# Patient Record
Sex: Female | Born: 1935
Health system: Southern US, Community
[De-identification: ages and names within clinical notes are randomized; demographics above are authoritative.]

## PROBLEM LIST (undated history)

## (undated) DIAGNOSIS — E785 Hyperlipidemia, unspecified: Secondary | ICD-10-CM

## (undated) DIAGNOSIS — K219 Gastro-esophageal reflux disease without esophagitis: Secondary | ICD-10-CM

## (undated) DIAGNOSIS — C4490 Unspecified malignant neoplasm of skin, unspecified: Secondary | ICD-10-CM

## (undated) DIAGNOSIS — D509 Iron deficiency anemia, unspecified: Secondary | ICD-10-CM

## (undated) DIAGNOSIS — Z85828 Personal history of other malignant neoplasm of skin: Secondary | ICD-10-CM

## (undated) DIAGNOSIS — I10 Essential (primary) hypertension: Secondary | ICD-10-CM

## (undated) DIAGNOSIS — D649 Anemia, unspecified: Secondary | ICD-10-CM

## (undated) DIAGNOSIS — Z8619 Personal history of other infectious and parasitic diseases: Secondary | ICD-10-CM

## (undated) DIAGNOSIS — F419 Anxiety disorder, unspecified: Secondary | ICD-10-CM

## (undated) DIAGNOSIS — E119 Type 2 diabetes mellitus without complications: Secondary | ICD-10-CM

## (undated) HISTORY — DX: Type 2 diabetes mellitus without complications: E11.9

## (undated) HISTORY — DX: Personal history of other malignant neoplasm of skin: Z85.828

## (undated) HISTORY — PX: ABDOMINAL HYSTERECTOMY: SHX81

## (undated) HISTORY — DX: Essential (primary) hypertension: I10

## (undated) HISTORY — DX: Iron deficiency anemia, unspecified: D50.9

## (undated) HISTORY — DX: Personal history of other infectious and parasitic diseases: Z86.19

## (undated) HISTORY — DX: Hyperlipidemia, unspecified: E78.5

## (undated) HISTORY — DX: Unspecified malignant neoplasm of skin, unspecified: C44.90

## (undated) HISTORY — DX: Anemia, unspecified: D64.9

## (undated) HISTORY — PX: CATARACT EXTRACTION: SUR2

---

## 1972-01-22 HISTORY — PX: TOTAL ABDOMINAL HYSTERECTOMY W/ BILATERAL SALPINGOOPHORECTOMY: SHX83

## 2007-04-10 ENCOUNTER — Ambulatory Visit: Payer: Self-pay | Admitting: Family Medicine

## 2008-04-14 ENCOUNTER — Ambulatory Visit: Payer: Self-pay | Admitting: Family Medicine

## 2009-02-25 ENCOUNTER — Emergency Department: Payer: Self-pay | Admitting: Internal Medicine

## 2009-05-09 ENCOUNTER — Emergency Department: Payer: Self-pay | Admitting: Emergency Medicine

## 2009-05-11 ENCOUNTER — Ambulatory Visit: Payer: Self-pay | Admitting: Family Medicine

## 2009-07-17 ENCOUNTER — Ambulatory Visit: Payer: Self-pay | Admitting: Vascular Surgery

## 2009-07-17 ENCOUNTER — Emergency Department (HOSPITAL_COMMUNITY): Admission: EM | Admit: 2009-07-17 | Discharge: 2009-07-17 | Payer: Self-pay | Admitting: Emergency Medicine

## 2009-07-17 ENCOUNTER — Encounter (INDEPENDENT_AMBULATORY_CARE_PROVIDER_SITE_OTHER): Payer: Self-pay | Admitting: Emergency Medicine

## 2009-10-13 ENCOUNTER — Ambulatory Visit: Payer: Self-pay | Admitting: General Practice

## 2009-11-21 ENCOUNTER — Ambulatory Visit: Payer: Self-pay | Admitting: Anesthesiology

## 2009-11-22 ENCOUNTER — Ambulatory Visit: Payer: Self-pay | Admitting: General Practice

## 2010-02-26 ENCOUNTER — Ambulatory Visit: Payer: Self-pay | Admitting: General Practice

## 2010-03-14 ENCOUNTER — Ambulatory Visit: Payer: Self-pay | Admitting: General Practice

## 2010-03-22 HISTORY — PX: REPLACEMENT TOTAL KNEE: SUR1224

## 2010-04-02 ENCOUNTER — Inpatient Hospital Stay: Payer: Self-pay | Admitting: General Practice

## 2010-09-04 ENCOUNTER — Ambulatory Visit: Payer: Self-pay | Admitting: Family Medicine

## 2011-01-01 ENCOUNTER — Ambulatory Visit: Payer: Self-pay | Admitting: General Practice

## 2011-01-22 HISTORY — PX: REPLACEMENT TOTAL KNEE: SUR1224

## 2011-02-11 ENCOUNTER — Inpatient Hospital Stay: Payer: Self-pay | Admitting: General Practice

## 2011-02-11 LAB — POTASSIUM: Potassium: 3.5 mmol/L (ref 3.5–5.1)

## 2011-02-12 LAB — BASIC METABOLIC PANEL
Anion Gap: 13 (ref 7–16)
BUN: 9 mg/dL (ref 7–18)
Chloride: 105 mmol/L (ref 98–107)
Creatinine: 0.56 mg/dL — ABNORMAL LOW (ref 0.60–1.30)
EGFR (African American): >60
Glucose: 111 mg/dL — ABNORMAL HIGH (ref 65–99)
Potassium: 3 mmol/L — ABNORMAL LOW (ref 3.5–5.1)
Sodium: 145 mmol/L (ref 136–145)

## 2011-02-13 LAB — BASIC METABOLIC PANEL
Anion Gap: 11 (ref 7–16)
BUN: 7 mg/dL (ref 7–18)
EGFR (Non-African Amer.): >60
Osmolality: 280 (ref 275–301)
Potassium: 3.2 mmol/L — ABNORMAL LOW (ref 3.5–5.1)

## 2011-02-13 LAB — HEMOGLOBIN: HGB: 9.6 g/dL — ABNORMAL LOW (ref 12.0–16.0)

## 2011-02-13 LAB — PLATELET COUNT: Platelet: 225 10*3/uL (ref 150–440)

## 2011-02-14 LAB — BASIC METABOLIC PANEL
Anion Gap: 11 (ref 7–16)
BUN: 11 mg/dL (ref 7–18)
Calcium, Total: 9.9 mg/dL (ref 8.5–10.1)
Co2: 27 mmol/L (ref 21–32)
Creatinine: 0.52 mg/dL — ABNORMAL LOW (ref 0.60–1.30)
EGFR (Non-African Amer.): >60
Glucose: 147 mg/dL — ABNORMAL HIGH (ref 65–99)
Osmolality: 278 (ref 275–301)
Potassium: 3.6 mmol/L (ref 3.5–5.1)
Sodium: 138 mmol/L (ref 136–145)

## 2011-07-16 ENCOUNTER — Ambulatory Visit: Payer: Self-pay | Admitting: Obstetrics and Gynecology

## 2011-07-16 LAB — CBC
HCT: 32.7 % — ABNORMAL LOW (ref 35.0–47.0)
HGB: 10.6 g/dL — ABNORMAL LOW (ref 12.0–16.0)
MCH: 27.3 pg (ref 26.0–34.0)
MCHC: 32.5 g/dL (ref 32.0–36.0)
WBC: 5 10*3/uL (ref 3.6–11.0)

## 2011-07-16 LAB — BASIC METABOLIC PANEL
BUN: 21 mg/dL — ABNORMAL HIGH (ref 7–18)
Calcium, Total: 9.7 mg/dL (ref 8.5–10.1)
Chloride: 103 mmol/L (ref 98–107)
Co2: 27 mmol/L (ref 21–32)
Creatinine: 0.85 mg/dL (ref 0.60–1.30)
EGFR (Non-African Amer.): >60
Potassium: 3.4 mmol/L — ABNORMAL LOW (ref 3.5–5.1)
Sodium: 140 mmol/L (ref 136–145)

## 2011-07-22 ENCOUNTER — Ambulatory Visit: Payer: Self-pay | Admitting: Obstetrics and Gynecology

## 2011-07-23 LAB — BASIC METABOLIC PANEL
Anion Gap: 10 (ref 7–16)
Co2: 27 mmol/L (ref 21–32)
Creatinine: 0.59 mg/dL — ABNORMAL LOW (ref 0.60–1.30)
EGFR (African American): >60
EGFR (Non-African Amer.): >60
Glucose: 148 mg/dL — ABNORMAL HIGH (ref 65–99)

## 2011-07-23 LAB — HEMOGLOBIN: HGB: 9.8 g/dL — ABNORMAL LOW (ref 12.0–16.0)

## 2011-09-05 ENCOUNTER — Ambulatory Visit: Payer: Self-pay | Admitting: Family Medicine

## 2012-01-31 ENCOUNTER — Emergency Department: Payer: Self-pay | Admitting: Unknown Physician Specialty

## 2012-01-31 LAB — URINALYSIS, COMPLETE
Bacteria: NONE SEEN
Bilirubin,UR: NEGATIVE
Glucose,UR: NEGATIVE mg/dL (ref 0–75)
Leukocyte Esterase: NEGATIVE
Nitrite: NEGATIVE
Ph: 6 (ref 4.5–8.0)
RBC,UR: <1 /HPF (ref 0–5)
Squamous Epithelial: NONE SEEN
WBC UR: NONE SEEN /HPF (ref 0–5)

## 2012-09-23 ENCOUNTER — Ambulatory Visit: Payer: Self-pay | Admitting: Family Medicine

## 2013-01-28 DIAGNOSIS — K469 Unspecified abdominal hernia without obstruction or gangrene: Secondary | ICD-10-CM | POA: Insufficient documentation

## 2013-01-28 DIAGNOSIS — N993 Prolapse of vaginal vault after hysterectomy: Secondary | ICD-10-CM | POA: Insufficient documentation

## 2013-03-08 DIAGNOSIS — E785 Hyperlipidemia, unspecified: Secondary | ICD-10-CM | POA: Insufficient documentation

## 2013-03-08 DIAGNOSIS — N951 Menopausal and female climacteric states: Secondary | ICD-10-CM | POA: Insufficient documentation

## 2013-03-08 DIAGNOSIS — I10 Essential (primary) hypertension: Secondary | ICD-10-CM | POA: Insufficient documentation

## 2013-03-08 DIAGNOSIS — E1122 Type 2 diabetes mellitus with diabetic chronic kidney disease: Secondary | ICD-10-CM | POA: Insufficient documentation

## 2013-03-21 HISTORY — PX: OTHER SURGICAL HISTORY: SHX169

## 2013-05-28 ENCOUNTER — Ambulatory Visit: Payer: Self-pay | Admitting: Family Medicine

## 2013-09-23 ENCOUNTER — Ambulatory Visit: Payer: Self-pay | Admitting: Gastroenterology

## 2013-09-23 LAB — HM COLONOSCOPY: HM Colonoscopy: NORMAL

## 2013-09-23 LAB — HM MAMMOGRAPHY: HM Mammogram: NORMAL

## 2013-09-24 LAB — PATHOLOGY REPORT

## 2013-10-08 ENCOUNTER — Ambulatory Visit: Payer: Self-pay | Admitting: Family Medicine

## 2014-01-25 DIAGNOSIS — C4441 Basal cell carcinoma of skin of scalp and neck: Secondary | ICD-10-CM | POA: Diagnosis not present

## 2014-02-08 DIAGNOSIS — D0461 Carcinoma in situ of skin of right upper limb, including shoulder: Secondary | ICD-10-CM | POA: Diagnosis not present

## 2014-03-21 DIAGNOSIS — Z0000 Encounter for general adult medical examination without abnormal findings: Secondary | ICD-10-CM | POA: Diagnosis not present

## 2014-03-21 DIAGNOSIS — D509 Iron deficiency anemia, unspecified: Secondary | ICD-10-CM | POA: Diagnosis not present

## 2014-03-21 DIAGNOSIS — I10 Essential (primary) hypertension: Secondary | ICD-10-CM | POA: Diagnosis not present

## 2014-03-21 DIAGNOSIS — Z1389 Encounter for screening for other disorder: Secondary | ICD-10-CM | POA: Diagnosis not present

## 2014-03-21 DIAGNOSIS — R110 Nausea: Secondary | ICD-10-CM | POA: Diagnosis not present

## 2014-05-10 DIAGNOSIS — E119 Type 2 diabetes mellitus without complications: Secondary | ICD-10-CM | POA: Diagnosis not present

## 2014-05-10 DIAGNOSIS — J309 Allergic rhinitis, unspecified: Secondary | ICD-10-CM | POA: Diagnosis not present

## 2014-05-10 DIAGNOSIS — R05 Cough: Secondary | ICD-10-CM | POA: Diagnosis not present

## 2014-05-10 DIAGNOSIS — I10 Essential (primary) hypertension: Secondary | ICD-10-CM | POA: Diagnosis not present

## 2014-05-10 DIAGNOSIS — E785 Hyperlipidemia, unspecified: Secondary | ICD-10-CM | POA: Diagnosis not present

## 2014-05-10 DIAGNOSIS — D509 Iron deficiency anemia, unspecified: Secondary | ICD-10-CM | POA: Diagnosis not present

## 2014-05-10 LAB — BASIC METABOLIC PANEL
BUN: 18 mg/dL (ref 4–21)
Glucose: 142 mg/dL
Potassium: 4 mmol/L (ref 3.4–5.3)
Sodium: 143 mmol/L (ref 137–147)

## 2014-05-10 LAB — LIPID PANEL
CHOLESTEROL: 231 mg/dL — AB (ref 0–200)
HDL: 48 mg/dL (ref 35–70)
LDL Cholesterol: 112 mg/dL
Triglycerides: 354 mg/dL — AB (ref 40–160)

## 2014-05-10 LAB — HEMOGLOBIN A1C: HEMOGLOBIN A1C: 7 % — AB (ref 4.0–6.0)

## 2014-05-10 LAB — CBC AND DIFFERENTIAL
HCT: 35 % — AB (ref 36–46)
HEMOGLOBIN: 11.5 g/dL — AB (ref 12.0–16.0)
Platelets: 431 10*3/uL — AB (ref 150–399)
WBC: 6.7 10*3/mL

## 2014-05-15 NOTE — Op Note (Signed)
PATIENT NAME:  Natalie Bray, Natalie Bray MR#:  [id number] DATE OF BIRTH:  Oct 19, 1934  DATE OF PROCEDURE:  07/22/2011  PREOPERATIVE DIAGNOSIS: Pelvic organ prolapse.   POSTOPERATIVE DIAGNOSIS: Pelvic organ prolapse.   OPERATIVE PROCEDURES: 1. Anterior colporrhaphy. 2. Posterior colporrhaphy. 3. Enterocele ligation.   SURGEON: Alanda Slim. DeFrancesco, MD   FIRST ASSISTANT: None.   ANESTHESIA: General with LMA.  INDICATIONS: The patient is a 80 year old white female, status post hysterectomy in the past, who presents for pelvic organ prolapse surgery. The patient failed conservative management with pessary use.   FINDINGS AT SURGERY: Findings at surgery revealed a second degree cystocele, large rectocele, and enterocele. The enterocele sac was opened, explored, and without evidence of adhesions. The sac was then closed with correction of the defect.   DESCRIPTION OF PROCEDURE: The patient was brought to the operating room where she was placed in the supine position. General anesthesia was induced with LMA without difficulty. She was placed in the dorsal lithotomy position using the candy-cane stirrups. Surgical support stockings were in place as well as the SCDs. The perineum and vaginal locations were prepped with Betadine in standard fashion. Appropriate draping was accomplished. The anterior colporrhaphy was performed in standard fashion. The apex of the vagina was grasped with Allis clamps. There was a transverse linear incision made between the Allis clamps. The vaginal mucosa in the midline was then undermined with Metzenbaum scissors and incised sharply. Allis Adair retractors were used to facilitate exposure and this process was carried out up to within 2 cm of the urethral meatus. Once this was accomplished, the perivesical fascia was dissected off the vagina through sharp and blunt dissection. The anterior cul-de-sac was entered and enterocele was exposed. Digital exploration of the peritoneal  cavity revealed no significant adhesions being present. The enterocele sac was opened, excised, and then closed with a purse-string stitch of 0 Vicryl. The cystocele was then treated using vertical mattress sutures of 0 Vicryl. This nicely reduced the cystocele. Finally, the vaginal mucosa excess was excised with Metzenbaums and the vaginal mucosa was then reapproximated in the midline using 2-0 chromic suture in a simple interrupted manner. Attention was then turned to the posterior wall defect. The Allis clamps were used to grasp the lateral aspects of the introitus. A diamond-shaped wedge of tissue was excised from the perineum. The vaginal mucosa was undermined in the midline using Metzenbaum scissors and incised in the midline. Allis Adair clamps were used to facilitate exposure. The perirectal fascia was then dissected off the vaginal mucosa through sharp and blunt dissection. Once adequately mobilized, the rectocele was reduced using 0 Vicryl horizontal mattress sutures. A good shelf was created. Excess vaginal mucosa was trimmed. The vagina was then reapproximated in the midline. Vagina was adequately closed as was the perineum with the 2-0 chromic suture. Upon completion of the posterior repair, the vagina was packed with Kerlix with Premarin cream application. The patient was then mobilized, awakened, and taken to the recovery room in satisfactory condition.   Estimated blood loss was 100 mL. IV fluids infused were 1 liter. Urine output was 275 mL of clear yellow urine. All instruments, needles, and sponge counts were verified as correct.   ____________________________ Alanda Slim. DeFrancesco, MD mad:drc D: 07/22/2011 15:27:32 ET T: 07/22/2011 15:48:55 ET JOB#: [id number]  cc: Hassell Done A. DeFrancesco, MD, <Dictator> [facility] Alanda Slim DEFRANCESCO MD ELECTRONICALLY SIGNED 07/23/2011 12:18

## 2014-05-15 NOTE — Discharge Summary (Signed)
PATIENT NAME:  Natalie Bray, Natalie Bray MR#:  [id number] DATE OF BIRTH:  Jan 09, 1935  DATE OF ADMISSION:  02/11/2011 DATE OF DISCHARGE:  02/14/2011  ADMITTING DIAGNOSIS: Degenerative arthrosis of the left knee.   DISCHARGE DIAGNOSIS: Degenerative arthrosis of the left knee.   HISTORY: The patient is a pleasant 80 year old who has been followed at [facility] for progression of left knee pain. She had reported a several-year history of left knee pain. She had localized most of the pain along the lateral aspect of the knee. The patient has stated her pain was aggravated with weight-bearing activities. She had noticed some swelling of the knee as well. She had seen some modest improvement in the knee pain following cortisone injection, but it was temporary. At the time of surgery, she was not using any type of ambulatory aid. She has continued to try to be active, working out; but due to the increased left knee pain, she has had to discontinue this. She states that the pain had increased to the point that it was significantly interfering with her activities of daily living. X-rays taken in the [place] showed narrowing of the lateral cartilage space with associated valgus alignment. She was noted to have some osteophyte as well as subchondral sclerosis. After discussion of the risks and benefits of surgical intervention, the patient expressed her understanding of the risks and benefits and agreed with plans for surgical intervention.   PROCEDURE: Left total knee arthroplasty using computer-assisted navigation. Anesthesia: Femoral nerve block with spinal. Soft tissue release: Anterior cruciate ligament, posterior cruciate ligament, deep medial collateral ligaments as well as the patellofemoral ligament. Implants utilized: DePuy PFC Sigma size 3 posterior stabilized femoral component (cemented), size 3 MBT tibial component (cemented), 32 mm 3-pegged oval dome patella (cemented), and a 10  mm stabilized rotating platform polyethylene insert.   HOSPITAL COURSE: The patient tolerated the procedure very well. She had no complications. She was then taken to the PAC-U where she was stabilized and then transferred to the Orthopedic floor. She began receiving anticoagulation therapy of Lovenox 30 mg subcutaneous every 12 hours per Anesthesia protocol. She was fitted with TED stockings bilaterally. These were allowed to be removed one hour per 8-hour shift. She was also fitted with the AVI compression foot pumps bilaterally set at 130 mmHg. Her heels have been elevated off the bed using rolled towels. Her heels have been nontender. Negative Homans sign. No evidence of any deep venous thrombosis.   The patient is denying chest pain or shortness of breath. Vital signs have been stable. She has been afebrile. Hemodynamically she was stable, and no transfusions were given other than the Autovac transfusions for 6 hours postoperatively.   The patient began receiving physical therapy on day one for gait training and transfers. She has done extremely well. She was ambulating greater than 200 feet upon discharge. She was able go up and down four sets of steps. She was independent with bed-to-chair transfers. Occupational therapy was also initiated on day one for activities of daily living and assistive devices.   The patient's IV, Hemovac, and Foley were removed on day two along with the dressing change. The Polar Care was reapplied to the surgical leg to maintain a temperature of 40 to 50 degrees Fahrenheit.   DISPOSITION: The patient is being discharged to home in improved stable condition.   DISCHARGE INSTRUCTIONS:  1. She may weight bear as tolerated.  2. Continue with the TED stockings. These are to be worn  during the day but may remove these at night.  3. Continue with the Polar Care to maintain a temperature of 40 to 50 degrees Fahrenheit.  4. She is to continue using a walker until cleared by  Physical Therapy to go to a quad cane.  5. She will receive Home Health physical therapy.  6. She is placed on a regular diet.  7. She is to resume her regular medication that she was on prior to admission. 8.  She was given additional prescriptions of Lovenox 40 mg subcutaneously daily for 14 days, then discontinue and begin taking one 81 mg enteric-coated aspirin per day. Also, oxycodone 5 to 10 mg every 4 to 6 hours p.r.n. for pain, and Ultram 50 mg 1 to 2 tablets every 4 to 6 hours p.r.n. for pain.  9. She was instructed on wound care.  10. She has a follow-up appointment on 02/21/2011 at 11:00 p.m. She is to call the clinic sooner if any temperatures of 101.5 or greater or excessive bleeding.   PAST MEDICAL HISTORY:  1. Hypertension.  2. Diabetes.  3. Hyperlipidemia.  4. Skin cancer.  5. Shingles.  6. Chickenpox.  7. Cataracts.  8. Bell's palsy.  ____________________________ Vance Peper, PA jrw:cbb D: 02/14/2011 07:33:05 ET T: 02/14/2011 12:35:22 ET JOB#: [id number]  cc: Vance Peper, PA, <Dictator> Johnmatthew Solorio PA ELECTRONICALLY SIGNED 02/14/2011 19:46

## 2014-05-15 NOTE — Op Note (Signed)
PATIENT NAME:  Natalie Bray, Natalie Bray MR#:  [id number] DATE OF BIRTH:  Jul 30, 1934  DATE OF PROCEDURE:  02/11/2011  PREOPERATIVE DIAGNOSIS: Degenerative arthrosis of the left knee.   POSTOPERATIVE DIAGNOSIS: Degenerative arthrosis of the left knee.   PROCEDURE PERFORMED: Left total knee arthroplasty using computer-assisted navigation.   SURGEON: Skip Estimable, M.D.   ASSISTANT: Vance Peper, PA-C (required to maintain retraction throughout the procedure).   ANESTHESIA: Femoral nerve block and spinal.   ESTIMATED BLOOD LOSS: 75 mL.  FLUIDS REPLACED: 1,700 mL crystalloid.  TOURNIQUET TIME: 81 minutes.  DRAINS: Two medium drains to reinfusion system.   SOFT TISSUE RELEASES: Anterior cruciate ligament, posterior cruciate ligament, deep medial collateral ligament, patellofemoral ligament.   IMPLANTS UTILIZED: DePuy PFC Sigma size three posterior stabilized femoral component (cemented), size three MBT tibial component (cemented), 32 mm three peg oval dome patella (cemented), and a 10 mm stabilized rotating platform polyethylene insert.   INDICATIONS FOR SURGERY: The patient is a 80 year old female who has been seen for complaints of progressive left knee pain. X-rays demonstrated severe degenerative changes in tricompartmental fashion with relative valgus deformity. She had not seen any significant improvement despite conservative nonsurgical intervention. After a discussion of the risks and benefits of surgical intervention, the patient expressed her understanding of the risks and benefits, and agreed with plans for surgical intervention.   PROCEDURE IN DETAIL: The patient was brought into the Operating Room and, after adequate femoral nerve block and spinal anesthesia was achieved, a tourniquet was placed on the patient's upper left thigh. The patient's left knee and leg were cleaned and prepped with alcohol and DuraPrep and draped in the usual sterile fashion. A "time-out" was performed as per usual  protocol. The left lower extremity was exsanguinated using an Esmarch and the tourniquet was inflated to 300 mmHg. An anterior longitudinal incision was made followed by a standard mid vastus approach. Moderate effusion was evacuated. Deep fibers of the medial collateral ligament were elevated in a subperiosteal fashion off the medial flare of the tibia so as to maintain a continuous soft tissue sleeve. The patella was subluxed laterally and the patellofemoral ligament was incised. Inspection of the knee demonstrated loss of articular cartilage in all three compartments with evidence of eburnated bone to the lateral compartment. Prominent osteophytes were debrided using a rongeur. The anterior and posterior cruciate ligaments were excised. Two 4.0 millimeter Schanz pins were inserted into the femur and into the tibia for attachment of the ray of spheres used for computer-assisted navigation. Hip center was identified using a circumduction technique. Distal landmarks were mapped using the computer. The distal femur and proximal tibia were mapped using the computer. The extramedullary tibial cutting guide was positioned using computer-assisted navigation so as to achieve 5 degrees distal valgus cut. Cut was performed and verified using the computer. Distal femur was sized and it was felt that a size three femoral component was appropriate. A size three cutting guide was positioned using computer-assisted navigation and the anterior cut was performed and verified using the computer. This was followed by completion of the posterior and chamfer cuts. A femoral cutting guide for the central box was then positioned and the central box cut was performed.   Attention was then directed to the proximal tibia. Medial and lateral menisci were excised. The extramedullary tibial cutting guide was positioned using computer-assisted navigation so as to achieve 0 degree varus valgus alignment and a 0 degree posterior slope. Cut was  performed and verified using the computer. The  proximal tibia was sized and it was felt that a size three tibial tray was appropriate. Tibial and femoral trials were inserted followed by insertion of a 10 mm polyethylene insert. The knee was felt to be balanced both in full extension and in 90 degrees of flexion. Finally, the patella was cut and prepared so as to accommodate a 32 mm three peg oval dome patella. Patellar trial was placed and the knee was placed through a range of motion with excellent patellar tracking appreciated.   The femoral trial was removed. Central post hole for the tibial component was reamed followed by insertion of a keel punch. Tibial trials were then removed. The cut surfaces of bone were irrigated with copious amounts of normal saline with antibiotic solution using pulsatile lavage and then suctioned dry. Polymethyl methacrylate cement with gentamicin was prepared in the usual fashion using a vacuum mixer. Gentamicin cement was used due to the patient's history of diabetes. Cement was then applied to the cut surface of the proximal tibia as well as along the undersurface of a size three MBT tibial component. The tibial component was positioned and impacted into place. Excess cement was removed using freer elevators. Cement was then applied to the cut surface of the femur as well as along the posterior flanges of a size three posterior stabilized femoral component. Femoral component was positioned and impacted into place. Excess cement was removed using freer elevators. A 10 mm polyethylene trial was inserted and the knee was brought in full extension with steady axial compression applied. Finally, cement was applied to the backside of a 32 mm three peg oval dome patella and the patellar component was positioned and patellar clamp applied. Excess cement was removed using freer elevators.   After adequate curing of cement, the tourniquet was deflated after a total tourniquet time of 81  minutes. Hemostasis was achieved using electrocautery. The knee was irrigated with copious amounts of normal saline with antibiotic solution using pulsatile lavage and then suctioned dry. The knee was inspected for any residual cement debris. 30 mL of 0.25% Marcaine with epinephrine was injected along the posterior capsule. A 10 mm stabilized rotating platform polyethylene insert was inserted and the knee was placed through a range of motion. Excellent mediolateral soft tissue balancing was appreciated both in full extension and in 90 degrees of flexion. Excellent patellar tracking was appreciated. Two medium drains were placed in the wound bed and brought out through a separate stab incision to be attached to a reinfusion system. The medial parapatellar portion of the incision was reapproximated using interrupted sutures of #1 Vicryl. The subcutaneous tissue was approximated in layers using first #0 Vicryl followed by 2-0 Vicryl. Skin was closed with skin staples. A sterile dressing was applied. The patient tolerated the procedure well. She was transported to the recovery room in stable condition.   ____________________________ Laurice Record. Holley Bouche., MD jph:ap D: 02/11/2011 10:37:32 ET T: 02/11/2011 12:11:40 ET JOB#: [id number]  cc: Jeneen Rinks P. Holley Bouche., MD, <Dictator> JAMES P Holley Bouche MD ELECTRONICALLY SIGNED 02/14/2011 20:31

## 2014-05-15 NOTE — H&P (Signed)
PATIENT NAME:  Natalie Bray, Natalie Bray MR#:  [id number] DATE OF BIRTH:  09/27/1934  DATE OF ADMISSION:  07/22/2011  PREOPERATIVE DIAGNOSIS: Symptomatic pelvic relaxation (cystocele, rectocele, enterocele).   HISTORY OF PRESENT ILLNESS:  Natalie Bray is a 80 year old white female, Para 2-0-0-2, status post total abdominal hysterectomy, bilateral salpingo-oophorectomy, in the past, who presents for definitive surgery of symptomatic pelvic relaxation. The patient has vaginal vault prolapse with third-degree cystourethrocele and rectocele which has undergone trial of Gellhorn pessary use without success. The patient desires definitive treatment. She is using Estrace cream biweekly for vaginal epithelium preparation.   PAST MEDICAL HISTORY:  1. Hypertension.  2. Hyperlipidemia.  3. Diabetes mellitus, type 2.  4. Gastroesophageal reflux disease.  5. Skin cancers.   PAST SURGICAL HISTORY:  1. Total abdominal hysterectomy with bilateral salpingo-oophorectomy and appendectomy.  2. Cataract surgery.  3. Bilateral knee replacement.   PAST OB HISTORY: Para 2-0-0-2, spontaneous vaginal delivery x2 with largest baby being 8 pounds, 5 ounces.   PAST GYN HISTORY: Menarche age 79. Surgical menopause age 37. The patient is currently on estrogen replacement therapy using Estradiol 0.5 mg p.o. daily.   FAMILY HISTORY: Negative for cancer of the colon, ovary, or breast.   SOCIAL HISTORY: The patient does not smoke, does not drink. 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   REVIEW OF SYSTEMS: The patient denies recent illness. She denies reactive airway disease. She denies history of coagulopathy. The patient has had preoperative clearance by Dr. Lelon Huh.   PHYSICAL EXAMINATION:  VITAL SIGNS: Height 5 feet, 4 inches, weight 165.9, blood pressure 152/63, heart rate 39.   GENERAL: The patient is a pleasant elderly female in no acute distress. She is alert and oriented.   HEENT:  Oropharynx is notable for dentures. Pharynx is clear.    NECK: Supple. No thyromegaly or adenopathy.   LUNGS: Clear.   HEART: Regular rate and rhythm without murmur, S3 or S4.   ABDOMEN: Soft, nontender. No organomegaly.   PELVIC: External genitalia with atrophic changes. BUS normal. Vaginal vault prolapse was noted with vaginal epithelium slightly cornified and with mild atrophy. No ulceration is noted. Third degree cystourethrocele and rectocele are identified.   EXTREMITIES: Without clubbing, cyanosis, or edema.   SKIN: Without rash.   MUSCULOSKELETAL: Normal.   IMPRESSION: Symptomatic pelvic relaxation with large cystocele, rectocele, and enterocele.   PLAN: Anterior posterior colporrhaphy with enterocele ligation. Date of surgery is 07/22/2011.   CONSENT NOTE: Natalie Bray is to undergo vaginal surgery for pelvic relaxation. She is understanding of the planned procedure and is aware of and is accepting of all surgical risks which include, but are not limited to bleeding, infection, pelvic organ injury with need for repair, blood clot disorders, anesthesia risks, and death. The patient understands that incontinence could be exacerbated with correction of the vaginal vault. She understands there could be urinary retention which may require prolonged catheter use. All questions are answered. Informed consent is given. The patient is ready and willing to proceed with surgery as scheduled.   ____________________________ Alanda Slim Natalie Piche, MD mad:ap D: 07/16/2011 10:22:21 ET T: 07/16/2011 10:54:58 ET JOB#: [id number]  cc: Hassell Done A. Janneth Krasner, MD, <Dictator> Alanda Slim Aymen Widrig MD ELECTRONICALLY SIGNED 07/23/2011 12:17

## 2014-07-18 DIAGNOSIS — X32XXXA Exposure to sunlight, initial encounter: Secondary | ICD-10-CM | POA: Diagnosis not present

## 2014-07-18 DIAGNOSIS — Z85828 Personal history of other malignant neoplasm of skin: Secondary | ICD-10-CM | POA: Diagnosis not present

## 2014-07-18 DIAGNOSIS — L570 Actinic keratosis: Secondary | ICD-10-CM | POA: Diagnosis not present

## 2014-07-18 DIAGNOSIS — D1801 Hemangioma of skin and subcutaneous tissue: Secondary | ICD-10-CM | POA: Diagnosis not present

## 2014-07-18 DIAGNOSIS — L821 Other seborrheic keratosis: Secondary | ICD-10-CM | POA: Diagnosis not present

## 2014-07-29 DIAGNOSIS — E119 Type 2 diabetes mellitus without complications: Secondary | ICD-10-CM | POA: Diagnosis not present

## 2014-07-29 LAB — HM DIABETES EYE EXAM

## 2014-08-04 ENCOUNTER — Ambulatory Visit: Payer: Self-pay | Admitting: Family Medicine

## 2014-08-08 ENCOUNTER — Encounter: Payer: Self-pay | Admitting: Family Medicine

## 2014-08-08 ENCOUNTER — Ambulatory Visit (INDEPENDENT_AMBULATORY_CARE_PROVIDER_SITE_OTHER): Payer: Commercial Managed Care - HMO | Admitting: Family Medicine

## 2014-08-08 VITALS — BP 120/60 | HR 68 | Temp 98.1°F | Resp 16 | Wt 168.0 lb

## 2014-08-08 DIAGNOSIS — E785 Hyperlipidemia, unspecified: Secondary | ICD-10-CM | POA: Diagnosis not present

## 2014-08-08 DIAGNOSIS — M5136 Other intervertebral disc degeneration, lumbar region: Secondary | ICD-10-CM

## 2014-08-08 DIAGNOSIS — D509 Iron deficiency anemia, unspecified: Secondary | ICD-10-CM | POA: Diagnosis not present

## 2014-08-08 DIAGNOSIS — M545 Low back pain, unspecified: Secondary | ICD-10-CM

## 2014-08-08 DIAGNOSIS — M25551 Pain in right hip: Secondary | ICD-10-CM

## 2014-08-08 DIAGNOSIS — M1990 Unspecified osteoarthritis, unspecified site: Secondary | ICD-10-CM | POA: Insufficient documentation

## 2014-08-08 DIAGNOSIS — K219 Gastro-esophageal reflux disease without esophagitis: Secondary | ICD-10-CM | POA: Insufficient documentation

## 2014-08-08 DIAGNOSIS — J309 Allergic rhinitis, unspecified: Secondary | ICD-10-CM | POA: Insufficient documentation

## 2014-08-08 DIAGNOSIS — E876 Hypokalemia: Secondary | ICD-10-CM | POA: Insufficient documentation

## 2014-08-08 DIAGNOSIS — M79672 Pain in left foot: Secondary | ICD-10-CM | POA: Diagnosis not present

## 2014-08-08 DIAGNOSIS — E871 Hypo-osmolality and hyponatremia: Secondary | ICD-10-CM | POA: Insufficient documentation

## 2014-08-08 DIAGNOSIS — I8390 Asymptomatic varicose veins of unspecified lower extremity: Secondary | ICD-10-CM | POA: Insufficient documentation

## 2014-08-08 DIAGNOSIS — E119 Type 2 diabetes mellitus without complications: Secondary | ICD-10-CM

## 2014-08-08 HISTORY — DX: Iron deficiency anemia, unspecified: D50.9

## 2014-08-08 LAB — POCT URINALYSIS DIPSTICK
Bilirubin, UA: NEGATIVE
GLUCOSE UA: NEGATIVE
Ketones, UA: NEGATIVE
Leukocytes, UA: NEGATIVE
Nitrite, UA: NEGATIVE
PH UA: 8
Protein, UA: NEGATIVE
RBC UA: NEGATIVE
Spec Grav, UA: 1.01
Urobilinogen, UA: 0.2

## 2014-08-08 LAB — POCT UA - MICROALBUMIN: Microalbumin Ur, POC: 20 mg/L

## 2014-08-08 LAB — POCT GLYCOSYLATED HEMOGLOBIN (HGB A1C)
Est. average glucose Bld gHb Est-mCnc: 157
Hemoglobin A1C: 7.1

## 2014-08-08 NOTE — Progress Notes (Signed)
Patient: Natalie Bray Female    DOB: 1934/04/27   80 y.o.   MRN: [id number] Visit Date: 08/08/2014  Today's Provider: Lelon Huh, MD   Chief Complaint  Patient presents with  . Diabetes  . Hyperlipidemia  . Anemia   Subjective:    HPI      Diabetes Mellitus Type II, Follow-up:   Lab Results  Component Value Date   HGBA1C 7.0* 05/10/2014   Last seen for diabetes 3 months ago.  Management changes included none. She reports good compliance with treatment. She is having side effects.  Current symptoms include hypoglycemia  and have been stable. Home blood sugar records: 80-120  Episodes of hypoglycemia? yes    Current Insulin Regimen: none Most Recent Eye Exam: 07/2014 Weight trend: stable Prior visit with dietician: no Current diet: diabetic Current exercise: none  ------------------------------------------------------------------------   Hypertension, follow-up:  BP Readings from Last 3 Encounters:  08/08/14 120/60  05/10/14 120/62    She was last seen for hypertension 3 months ago.  BP at that visit was 120/62. Management changes since that visit include  none.She reports good compliance with treatment. She is not having side effects.  She is not exercising. She is adherent to low salt diet.   Outside blood pressures are  150/90. She is experiencing none.  Patient denies chest pain, chest pressure/discomfort, claudication, dyspnea, exertional chest pressure/discomfort, fatigue, irregular heart beat, lower extremity edema, near-syncope, orthopnea, palpitations, paroxysmal nocturnal dyspnea, syncope and tachypnea.   Cardiovascular risk factors include diabetes mellitus.  Use of agents associated with hypertension: estrogens and NSAIDS.   ------------------------------------------------------------------------    Lipid/Cholesterol, Follow-up:   Last seen for this 3 months ago.  Management changes since that visit include  None. She is  tolerating lovastatin well with no adverse effects.    Last Lipid Panel:    Component Value Date/Time   CHOL 231* 05/10/2014   TRIG 354* 05/10/2014   HDL 48 05/10/2014   LDLCALC 112 05/10/2014    She reports good compliance with treatment. She is not having side effects.   Wt Readings from Last 3 Encounters:  08/08/14 168 lb (76.204 kg)  05/10/14 169 lb (76.658 kg)    ------------------------------------------------------------------------   Right hip pain Has been much more painful the last few days, feels like a catch in her hip when she walks.  She requests referral to Dr. Marry Guan   Complains of left foot pain Long history of pain left medial malleolus. States she injured it when she was young and is no hurting very day. She requests referral to podiatry.     Allergies  Allergen Reactions  . Celecoxib Rash    Flushed red   Previous Medications   ASPIRIN 81 MG CHEWABLE TABLET    Chew 81 mg by mouth daily.   CALCIUM-VITAMIN D (CALCIUM 500/D) 500-200 MG-UNIT PER TABLET    Take 1 tablet by mouth daily.   DOCUSATE SODIUM (COLACE) 100 MG CAPSULE    Take 100 mg by mouth daily.   ESTRADIOL (ESTRACE) 0.5 MG TABLET    Take 0.5 mg by mouth daily.   GLIPIZIDE (GLUCOTROL) 10 MG TABLET    Take 10 mg by mouth 2 (two) times daily.   IBUPROFEN (ADVIL,MOTRIN) 200 MG TABLET    Take 600 mg by mouth as needed.   LISINOPRIL-HYDROCHLOROTHIAZIDE (PRINZIDE,ZESTORETIC) 20-12.5 MG PER TABLET    Take 1 tablet by mouth daily.   LOVASTATIN (MEVACOR) 40 MG TABLET  Take 40 mg by mouth daily.   METFORMIN (GLUCOPHAGE) 1000 MG TABLET    Take 1,000 mg by mouth 2 (two) times daily.   OMEGA-3 1000 MG CAPS    Take 1 capsule by mouth 2 (two) times daily.   PANTOPRAZOLE (PROTONIX) 40 MG TABLET    Take 40 mg by mouth daily.   VITAMIN E (E-400) 400 UNIT CAPSULE    Take 1 capsule by mouth daily.    Review of Systems  Constitutional: Negative for fever and chills.  Respiratory: Positive for shortness  of breath and wheezing.   Cardiovascular: Negative for chest pain and leg swelling.  Gastrointestinal: Positive for abdominal pain.  Musculoskeletal: Positive for back pain (right side).    History  Substance Use Topics  . Smoking status: Never Smoker   . Smokeless tobacco: Never Used  . Alcohol Use: No   Objective:   BP 120/60 mmHg  Pulse 68  Temp(Src) 98.1 F (36.7 C) (Oral)  Resp 16  Wt 168 lb (76.204 kg)  Physical Exam  General Appearance:    Alert, cooperative, no distress  Eyes:    PERRL, conjunctiva/corneas clear, EOM's intact       Lungs:     Clear to auscultation bilaterally, respirations unlabored  Heart:    Regular rate and rhythm  Neurologic:   Awake, alert, oriented x 3. No apparent focal neurological           defect.   MS    Tender left medial malleolus with slight swelling, no erythema.     Results for orders placed or performed in visit on 08/08/14  POCT HgB A1C  Result Value Ref Range   Hemoglobin A1C 7.1    Est. average glucose Bld gHb Est-mCnc 157   POCT UA - Microalbumin  Result Value Ref Range   Microalbumin Ur, POC 20 mg/L   Creatinine, POC n/a mg/dL   Albumin/Creatinine Ratio, Urine, POC n/a   POCT urinalysis dipstick  Result Value Ref Range   Color, UA yellow    Clarity, UA clear    Glucose, UA neg    Bilirubin, UA neg    Ketones, UA neg    Spec Grav, UA 1.010    Blood, UA neg    pH, UA 8.0    Protein, UA neg    Urobilinogen, UA 0.2    Nitrite, UA neg    Leukocytes, UA Negative Negative       Assessment & Plan:      1. Type 2 diabetes mellitus without complication well controlled Continue current medications.   - POCT HgB A1C - POCT UA - Microalbumin - Renal function panel  2. Degenerative disc disease, lumbar Stable. Continue current medications.    3. Anemia, iron deficiency Doing well on current iron supplements.   4. Left foot pain  - Ambulatory referral to Podiatry  5. Right hip pain  - AMB referral to  orthopedics  6. Right-sided low back pain without sciatica  - POCT urinalysis dipstick  7. Hyperlipidemia She is tolerating lovastatin well with no adverse effects.   - Lipid panel        Lelon Huh, MD  [facility]

## 2014-08-09 LAB — RENAL FUNCTION PANEL
ALBUMIN: 4.1 g/dL (ref 3.5–4.8)
BUN/Creatinine Ratio: 27 — ABNORMAL HIGH (ref 11–26)
BUN: 17 mg/dL (ref 8–27)
CALCIUM: 9.9 mg/dL (ref 8.7–10.3)
CHLORIDE: 99 mmol/L (ref 97–108)
CO2: 25 mmol/L (ref 18–29)
Creatinine, Ser: 0.63 mg/dL (ref 0.57–1.00)
GFR calc Af Amer: 100 mL/min/{1.73_m2} (ref >59)
GFR calc non Af Amer: 87 mL/min/{1.73_m2} (ref >59)
Glucose: 138 mg/dL — ABNORMAL HIGH (ref 65–99)
POTASSIUM: 4.1 mmol/L (ref 3.5–5.2)
Phosphorus: 3.1 mg/dL (ref 2.5–4.5)
Sodium: 143 mmol/L (ref 134–144)

## 2014-08-09 LAB — LIPID PANEL
Chol/HDL Ratio: 4.3 ratio units (ref 0.0–4.4)
Cholesterol, Total: 217 mg/dL — ABNORMAL HIGH (ref 100–199)
HDL: 51 mg/dL (ref >39)
LDL Calculated: 98 mg/dL (ref 0–99)
Triglycerides: 341 mg/dL — ABNORMAL HIGH (ref 0–149)
VLDL Cholesterol Cal: 68 mg/dL — ABNORMAL HIGH (ref 5–40)

## 2014-08-29 ENCOUNTER — Ambulatory Visit (INDEPENDENT_AMBULATORY_CARE_PROVIDER_SITE_OTHER): Payer: Commercial Managed Care - HMO | Admitting: Podiatry

## 2014-08-29 ENCOUNTER — Ambulatory Visit (INDEPENDENT_AMBULATORY_CARE_PROVIDER_SITE_OTHER): Payer: Commercial Managed Care - HMO

## 2014-08-29 VITALS — BP 128/78 | HR 83 | Resp 16 | Ht 64.0 in | Wt 168.0 lb

## 2014-08-29 DIAGNOSIS — E119 Type 2 diabetes mellitus without complications: Secondary | ICD-10-CM

## 2014-08-29 DIAGNOSIS — Q6650 Congenital pes planus, unspecified foot: Secondary | ICD-10-CM

## 2014-08-29 DIAGNOSIS — M779 Enthesopathy, unspecified: Secondary | ICD-10-CM

## 2014-08-29 DIAGNOSIS — M722 Plantar fascial fibromatosis: Secondary | ICD-10-CM

## 2014-08-29 NOTE — Progress Notes (Signed)
   Subjective:    Patient ID: Natalie Bray, female    DOB: Jul 06, 1934, 80 y.o.   MRN: [id number]  HPI  Left foot pain , it has been going on for a pretty good while, it feels like the bone is going into my flesh, top of the left foot along around the side of the foot , i am diabetic. Last a1c was 7.2    Review of Systems  Endocrine:       Diabetic        Objective:   Physical Exam: I have reviewed her past mental history medications allergies surgery social history and review of systems. Pulses are strongly palpable. Neurologic sensorium is intact versus YC monofilament. Deep tendon reflexes are intact. Muscle strength +5 over 5 dorsiflexion plantar flexors and inverters everters on physical musculature is intact. Orthopedic evaluation demonstrates pain to the dorsal lateral aspect of the left foot she also has pain on palpation medial calcaneal tubercle of the left heel. Radiographs confirm soft tissue increased density at the plantar fascial calcaneal insertion site of the left heel.        Assessment & Plan:  Assessment: Plantar fasciitis left heel. Pes planus.  Plan: Discussed etiology pathology conservative versus surgical therapies. Discussed appropriate shoe gear stretching exercises ice therapy shoe modifications. Injected the left heel today with Kenalog and local anesthesia. Follow-up with her 1 month.

## 2014-09-02 DIAGNOSIS — M25551 Pain in right hip: Secondary | ICD-10-CM | POA: Diagnosis not present

## 2014-09-02 DIAGNOSIS — M5431 Sciatica, right side: Secondary | ICD-10-CM | POA: Diagnosis not present

## 2014-09-13 ENCOUNTER — Ambulatory Visit: Payer: Self-pay | Admitting: Family Medicine

## 2014-09-28 ENCOUNTER — Ambulatory Visit: Payer: Commercial Managed Care - HMO | Admitting: Podiatry

## 2014-10-03 ENCOUNTER — Other Ambulatory Visit: Payer: Self-pay | Admitting: Family Medicine

## 2014-10-06 ENCOUNTER — Ambulatory Visit (INDEPENDENT_AMBULATORY_CARE_PROVIDER_SITE_OTHER): Payer: Commercial Managed Care - HMO

## 2014-10-06 DIAGNOSIS — Z23 Encounter for immunization: Secondary | ICD-10-CM | POA: Diagnosis not present

## 2015-01-10 ENCOUNTER — Encounter: Payer: Self-pay | Admitting: Family Medicine

## 2015-01-10 ENCOUNTER — Other Ambulatory Visit: Payer: Self-pay | Admitting: Family Medicine

## 2015-01-10 ENCOUNTER — Ambulatory Visit (INDEPENDENT_AMBULATORY_CARE_PROVIDER_SITE_OTHER): Payer: Commercial Managed Care - HMO | Admitting: Family Medicine

## 2015-01-10 VITALS — BP 112/58 | HR 66 | Temp 97.5°F | Resp 16 | Ht 65.0 in | Wt 168.0 lb

## 2015-01-10 DIAGNOSIS — M545 Low back pain, unspecified: Secondary | ICD-10-CM

## 2015-01-10 DIAGNOSIS — E2839 Other primary ovarian failure: Secondary | ICD-10-CM

## 2015-01-10 DIAGNOSIS — M5136 Other intervertebral disc degeneration, lumbar region: Secondary | ICD-10-CM

## 2015-01-10 DIAGNOSIS — Z0000 Encounter for general adult medical examination without abnormal findings: Secondary | ICD-10-CM | POA: Diagnosis not present

## 2015-01-10 DIAGNOSIS — E119 Type 2 diabetes mellitus without complications: Secondary | ICD-10-CM

## 2015-01-10 DIAGNOSIS — I10 Essential (primary) hypertension: Secondary | ICD-10-CM | POA: Diagnosis not present

## 2015-01-10 LAB — POCT GLYCOSYLATED HEMOGLOBIN (HGB A1C)
ESTIMATED AVERAGE GLUCOSE: 148
HEMOGLOBIN A1C: 6.8

## 2015-01-10 NOTE — Progress Notes (Signed)
Patient: Natalie Bray, Female    DOB: 02/20/1934, 80 y.o.   MRN: [id number] Visit Date: 01/10/2015  Today's Provider: Lelon Huh, MD   Chief Complaint  Patient presents with  . Annual Exam  . Diabetes  . Hypertension  . Hyperlipidemia  . Gastroesophageal Reflux  . Anemia   Subjective:    Annual physical  Natalie Bray is a 80 y.o. female. She feels well. She reports exercising none. She reports she is sleeping well.  -----------------------------------------------------------  Follow-up for GERD; current treatment is omeprazole 40 mg qd. States omeprazole working well and has no symptoms so long as she takes it every day  Follow-up for Degenerative Disc Disease, lumbar from 08/08/2014; no changes were made. Is having some mild right lower back pain.    Diabetes Mellitus Type II, Follow-up:   Lab Results  Component Value Date   HGBA1C 7.1 08/08/2014   HGBA1C 7.0* 05/10/2014   Last seen for diabetes 5 months ago.  Management since then includes; no changes were made. She reports good compliance with treatment. She is not having side effects. none Current symptoms include none and have been stable. Home blood sugar records: fasting range: 100-130  Episodes of hypoglycemia? no   Current Insulin Regimen: n/a Most Recent Eye Exam: 07/2014 Weight trend: stable Prior visit with dietician: no Current diet: well balanced Current exercise: none  ----------------------------------------------------------------------   Hypertension, follow-up:  BP Readings from Last 3 Encounters:  01/10/15 112/58  08/29/14 128/78  08/08/14 120/60    She was last seen for hypertension 8 months ago.  BP at that visit was 120/62. Management since that visit includes; no changes were made.She reports good compliance with treatment. She is not having side effects. none  She is not exercising. She is adherent to low salt diet.   Outside blood pressures are 140/90. She  is experiencing none.  Patient denies none.   Cardiovascular risk factors include diabetes mellitus.  Use of agents associated with hypertension: none.   ----------------------------------------------------------------------    Lipid/Cholesterol, Follow-up:   Last seen for this 5 months ago.  Management since that visit includes; no changes were made.  Last Lipid Panel:    Component Value Date/Time   CHOL 217* 08/08/2014 0921   CHOL 231* 05/10/2014   TRIG 341* 08/08/2014 0921   HDL 51 08/08/2014 0921   HDL 48 05/10/2014   CHOLHDL 4.3 08/08/2014 0921   LDLCALC 98 08/08/2014 0921   LDLCALC 112 05/10/2014    She reports good compliance with treatment. She is not having side effects. none  Wt Readings from Last 3 Encounters:  01/10/15 168 lb (76.204 kg)  08/29/14 168 lb (76.204 kg)  08/08/14 168 lb (76.204 kg)    ----------------------------------------------------------------------     Review of Systems  Constitutional: Negative.   HENT: Negative.   Eyes: Negative.   Respiratory: Negative.   Cardiovascular: Negative.   Gastrointestinal: Negative.   Endocrine: Negative.   Genitourinary: Negative.   Musculoskeletal: Negative.   Skin: Negative.   Allergic/Immunologic: Negative.   Neurological: Negative.   Hematological: Negative.   Psychiatric/Behavioral: Negative.     Social History   Social History  . Marital Status: Married    Spouse Name: N/A  . Number of Children: N/A  . Years of Education: 12   Occupational History  . Employeed [employer]    people greeter   Social History Main Topics  . Smoking status: Never Smoker   . Smokeless tobacco: Never  Used  . Alcohol Use: No  . Drug Use: No  . Sexual Activity: Not on file   Other Topics Concern  . Not on file   Social History Narrative    Patient Active Problem List   Diagnosis Date Noted  . Degenerative disc disease, lumbar 08/08/2014  . Anemia, iron deficiency 08/08/2014  . Hyponatremia  08/08/2014  . Hypokalemia 08/08/2014  . Varicose vein 08/08/2014  . Allergic rhinitis 08/08/2014  . GERD (gastroesophageal reflux disease) 08/08/2014  . Osteoarthritis 08/08/2014  . Diabetes ([place]) 03/08/2013  . Hypertension 03/08/2013  . Hyperlipidemia 03/08/2013  . Post menopausal syndrome 03/08/2013  . Enterocele 01/28/2013  . Vaginal vault prolapse after hysterectomy 01/28/2013    Past Surgical History  Procedure Laterality Date  . Prolapsed bladder  03/2013    Dr. Zigmund Daniel  . Replacement total knee Left 01/2011    [facility]  . Replacement total knee Right 03/2010  . Total abdominal hysterectomy w/ bilateral salpingoophorectomy  1974    BSO    Her family history includes Aneurysm in her father; Diabetes in her brother, brother, sister, and sister; Hypertension in her brother, brother, sister, and sister; Kidney failure in her mother.    Previous Medications   ACCU-CHEK AVIVA PLUS TEST STRIP    TEST EVERY DAY   AMLODIPINE (NORVASC) 10 MG TABLET    TAKE 1 TABLET EVERY DAY   ASPIRIN 81 MG CHEWABLE TABLET    Chew 81 mg by mouth daily.   CALCIUM-VITAMIN D (CALCIUM 500/D) 500-200 MG-UNIT PER TABLET    Take 1 tablet by mouth daily.   DOCUSATE SODIUM (COLACE) 100 MG CAPSULE    Take 100 mg by mouth daily.   ESTRADIOL (ESTRACE) 0.5 MG TABLET    TAKE 1/2 TO 1 TABLET DAILY AS NEEDED   GEMFIBROZIL (LOPID) 600 MG TABLET    TAKE 1 TABLET TWICE DAILY   GLIPIZIDE (GLUCOTROL) 10 MG TABLET    Take 10 mg by mouth 2 (two) times daily.   IBUPROFEN (ADVIL,MOTRIN) 200 MG TABLET    Take 600 mg by mouth as needed. Reported on 01/10/2015   LISINOPRIL-HYDROCHLOROTHIAZIDE (PRINZIDE,ZESTORETIC) 20-12.5 MG PER TABLET    TAKE 2 TABLETS EVERY DAY   LOVASTATIN (MEVACOR) 40 MG TABLET    TAKE 1 TABLET EVERY DAY   METFORMIN (GLUCOPHAGE) 1000 MG TABLET    TAKE 1 TABLET TWICE DAILY   OMEGA-3 1000 MG CAPS    Take 1 capsule by mouth daily.    OMEPRAZOLE (PRILOSEC) 40 MG CAPSULE    Take 40 mg by mouth daily.    VITAMIN E (E-400) 400 UNIT CAPSULE    Take 1 capsule by mouth daily.    Patient Care Team: Birdie Sons, MD as PCP - General (Family Medicine) Dereck Leep, MD as Consulting Physician (Orthopedic Surgery) Brayton Mars, MD as Consulting Physician (Obstetrics and Gynecology) Josefine Class, MD as Referring Physician (Gastroenterology)     Objective:   Vitals: BP 112/58 mmHg  Pulse 66  Temp(Src) 97.5 F (36.4 C) (Oral)  Resp 16  Ht 5\' 5"  (1.651 m)  Wt 168 lb (76.204 kg)  BMI 27.96 kg/m2  SpO2 97%  Physical Exam   General Appearance:    Alert, cooperative, no distress, appears stated age  Head:    Normocephalic, without obvious abnormality, atraumatic  Eyes:    PERRL, conjunctiva/corneas clear, EOM's intact, fundi    benign, both eyes  Ears:    Normal TM's and external ear canals, both ears  Nose:   Nares normal, septum midline, mucosa normal, no drainage    or sinus tenderness  Throat:   Lips, mucosa, and tongue normal; teeth and gums normal  Neck:   Supple, symmetrical, trachea midline, no adenopathy;    thyroid:  no enlargement/tenderness/nodules; no carotid   bruit or JVD  Back:     Symmetric, no curvature, ROM normal, no CVA tenderness  Lungs:     Clear to auscultation bilaterally, respirations unlabored  Chest Wall:    No tenderness or deformity   Heart:    Regular rate and rhythm, S1 and S2 normal, no murmur, rub   or gallop  Breast Exam:    normal appearance, no masses or tenderness  Abdomen:     Soft, non-tender, bowel sounds active all four quadrants,    no masses, no organomegaly  Pelvic:    deferred  Extremities:   Extremities normal, atraumatic, no cyanosis or edema  Pulses:   2+ and symmetric all extremities  Skin:   Skin color, texture, turgor normal, no rashes or lesions  Lymph nodes:   Cervical, supraclavicular, and axillary nodes normal  Neurologic:   CNII-XII intact, normal strength, sensation and reflexes    throughout     Activities of Daily Living In your present state of health, do you have any difficulty performing the following activities: 01/10/2015  Hearing? N  Vision? N  Difficulty concentrating or making decisions? N  Walking or climbing stairs? N  Dressing or bathing? N  Doing errands, shopping? N    Fall Risk Assessment Fall Risk  01/10/2015  Falls in the past year? No     Depression Screen PHQ 2/9 Scores 01/10/2015  PHQ - 2 Score 0    Cognitive Testing - 6-CIT  Correct? Score   What year is it? yes 0 0 or 4  What month is it? yes 0 0 or 3  Memorize:    Pia Mau,  44,  [place],      What time is it? (within 1 hour) yes 0 0 or 3  Count backwards from 20 yes 0 0, 2, or 4  Name the months of the year yes 0 0, 2, or 4  Repeat name & address above yes 6 0, 2, 4, 6, 8, or 10       TOTAL SCORE  6/28   Interpretation:  Normal  Normal (0-7) Abnormal (8-28)   Audit-C Alcohol Use Screening  Question Answer Points  How often do you have alcoholic drink? never 0  On days you do drink alcohol, how many drinks do you typically consume? n/a 0  How oftey will you drink 6 or more in a total? never 0  Total Score:  0   A score of 3 or more in women, and 4 or more in men indicates increased risk for alcohol abuse, EXCEPT if all of the points are from question 1.   Results for orders placed or performed in visit on 01/10/15  POCT glycosylated hemoglobin (Hb A1C)  Result Value Ref Range   Hemoglobin A1C 6.8    Est. average glucose Bld gHb Est-mCnc 148   HM DIABETES EYE EXAM  Result Value Ref Range   HM Diabetic Eye Exam No Retinopathy No Retinopathy       Assessment & Plan:    Annual physical Reviewed patient's Family Medical History Reviewed and updated list of patient's medical providers Assessment of cognitive impairment was done Assessed patient's functional ability  Established a written schedule for health screening [place] Completed  and Reviewed  Exercise Activities and Dietary recommendations Goals    None      Immunization History  Administered Date(s) Administered  . Influenza, High Dose Seasonal PF 10/06/2014  . Pneumococcal Conjugate-13 04/28/2013  . Pneumococcal Polysaccharide-23 10/26/2011    Health Maintenance  Topic Date Due  . FOOT EXAM  10/03/1946  . OPHTHALMOLOGY EXAM  07/29/2015  . TETANUS/TDAP  10/03/1955  . ZOSTAVAX  10/02/1996  . DEXA SCAN  10/02/2001  . HEMOGLOBIN A1C  02/08/2015  . INFLUENZA VACCINE  08/22/2015  . PNA vac Low Risk Adult  Completed      Discussed health benefits of physical activity, and encouraged her to engage in regular exercise appropriate for her age and condition.    --------------------------------------------------------------------------------  1. Routine physical examination Generally doing well. Given contact information to schedule mammogram - EKG 12-Lead  2. Type 2 diabetes mellitus without complication, without long-term current use of insulin (HCC) Well controlled.  Continue current medications.   - POCT glycosylated hemoglobin (Hb A1C)  3. Estrogen deficiency  - DG Bone Density; Future  4. Essential hypertension Well controlled.  Continue current medications.    5. Degenerative disc disease, lumbar   6. Right-sided low back pain without sciatica  Likely secondary to DDD. Will be getting Xrays with BMD above. No urinary tract symptoms.

## 2015-02-01 ENCOUNTER — Ambulatory Visit: Payer: Self-pay

## 2015-02-19 ENCOUNTER — Encounter: Payer: Self-pay | Admitting: Medical Oncology

## 2015-02-19 ENCOUNTER — Emergency Department
Admission: EM | Admit: 2015-02-19 | Discharge: 2015-02-19 | Disposition: A | Discharge status: Home / Self Care | Payer: Commercial Managed Care - HMO | Attending: Emergency Medicine | Admitting: Emergency Medicine

## 2015-02-19 DIAGNOSIS — M545 Low back pain: Secondary | ICD-10-CM | POA: Diagnosis present

## 2015-02-19 DIAGNOSIS — I10 Essential (primary) hypertension: Secondary | ICD-10-CM | POA: Insufficient documentation

## 2015-02-19 DIAGNOSIS — Z79890 Hormone replacement therapy (postmenopausal): Secondary | ICD-10-CM | POA: Insufficient documentation

## 2015-02-19 DIAGNOSIS — Z7982 Long term (current) use of aspirin: Secondary | ICD-10-CM | POA: Insufficient documentation

## 2015-02-19 DIAGNOSIS — Z7984 Long term (current) use of oral hypoglycemic drugs: Secondary | ICD-10-CM | POA: Insufficient documentation

## 2015-02-19 DIAGNOSIS — M62830 Muscle spasm of back: Secondary | ICD-10-CM | POA: Diagnosis not present

## 2015-02-19 DIAGNOSIS — Z79899 Other long term (current) drug therapy: Secondary | ICD-10-CM | POA: Insufficient documentation

## 2015-02-19 DIAGNOSIS — E119 Type 2 diabetes mellitus without complications: Secondary | ICD-10-CM | POA: Insufficient documentation

## 2015-02-19 LAB — URINALYSIS COMPLETE WITH MICROSCOPIC (ARMC ONLY)
BILIRUBIN URINE: NEGATIVE
Bacteria, UA: NONE SEEN
GLUCOSE, UA: NEGATIVE mg/dL
HGB URINE DIPSTICK: NEGATIVE
KETONES UR: NEGATIVE mg/dL
Leukocytes, UA: NEGATIVE
NITRITE: NEGATIVE
Protein, ur: NEGATIVE mg/dL
SPECIFIC GRAVITY, URINE: 1.014 (ref 1.005–1.030)
pH: 6 (ref 5.0–8.0)

## 2015-02-19 LAB — BASIC METABOLIC PANEL
ANION GAP: 9 (ref 5–15)
BUN: 19 mg/dL (ref 6–20)
CALCIUM: 9.9 mg/dL (ref 8.9–10.3)
CO2: 28 mmol/L (ref 22–32)
Chloride: 102 mmol/L (ref 101–111)
Creatinine, Ser: 0.65 mg/dL (ref 0.44–1.00)
GLUCOSE: 83 mg/dL (ref 65–99)
Potassium: 3.4 mmol/L — ABNORMAL LOW (ref 3.5–5.1)
Sodium: 139 mmol/L (ref 135–145)

## 2015-02-19 LAB — CBC
HCT: 38.2 % (ref 35.0–47.0)
HEMOGLOBIN: 12.7 g/dL (ref 12.0–16.0)
MCH: 29.1 pg (ref 26.0–34.0)
MCHC: 33.2 g/dL (ref 32.0–36.0)
MCV: 87.7 fL (ref 80.0–100.0)
Platelets: 294 10*3/uL (ref 150–440)
RBC: 4.35 MIL/uL (ref 3.80–5.20)
RDW: 14.3 % (ref 11.5–14.5)
WBC: 7.8 10*3/uL (ref 3.6–11.0)

## 2015-02-19 MED ORDER — CYCLOBENZAPRINE HCL 10 MG PO TABS: 10.0000 mg | ORAL_TABLET | Freq: Every day | ORAL | Status: DC

## 2015-02-19 NOTE — ED Notes (Signed)
Discussed discharge instructions, prescriptions, and follow-up care with patient. No questions or concerns at this time. Pt stable at discharge.  

## 2015-02-19 NOTE — ED Provider Notes (Signed)
[facility] Emergency Department Provider Note  ____________________________________________  Time seen: On arrival  I have reviewed the triage vital signs and the nursing notes.   HISTORY  Chief Complaint Flank Pain    HPI Natalie Bray Natalie Bray is a 81 y.o. female who presents with complaints of right lower back pain for approximately 3 weeks. She reports the pain is mild to moderate and worse with twisting or ambulating. She denies injury or trauma to the area. She denies dysuria or frequency. No fevers no chills. No abdominal pain. No lower kidney weakness or tingling. No neuro deficits. No recent back procedures    Past Medical History  Diagnosis Date  . History of chicken pox   . History of skin cancer   . Anemia   . Anemia, iron deficiency 08/08/2014  . Diabetes mellitus without complication (Natalie Bray)   . Hypertension     Patient Active Problem List   Diagnosis Date Noted  . Degenerative disc disease, lumbar 08/08/2014  . Anemia, iron deficiency 08/08/2014  . Hyponatremia 08/08/2014  . Hypokalemia 08/08/2014  . Varicose vein 08/08/2014  . Allergic rhinitis 08/08/2014  . GERD (gastroesophageal reflux disease) 08/08/2014  . Osteoarthritis 08/08/2014  . Diabetes (Natalie Bray) 03/08/2013  . Hypertension 03/08/2013  . Hyperlipidemia 03/08/2013  . Post menopausal syndrome 03/08/2013  . Enterocele 01/28/2013  . Vaginal vault prolapse after hysterectomy 01/28/2013    Past Surgical History  Procedure Laterality Date  . Prolapsed bladder  03/2013    Dr. Zigmund Daniel  . Replacement total knee Left 01/2011    [facility]  . Replacement total knee Right 03/2010  . Total abdominal hysterectomy w/ bilateral salpingoophorectomy  1974    BSO    Current Outpatient Rx  Name  Route  Sig  Dispense  Refill  . ACCU-CHEK AVIVA PLUS test strip      TEST EVERY DAY   100 each   4   . Acetaminophen (TYLENOL ARTHRITIS PAIN PO)   Oral   Take by mouth daily as needed.          Marland Kitchen amLODipine (NORVASC) 10 MG tablet      TAKE 1 TABLET EVERY DAY   90 tablet   4   . aspirin 81 MG chewable tablet   Oral   Chew 81 mg by mouth daily.         . calcium-vitamin D (CALCIUM 500/D) 500-200 MG-UNIT per tablet   Oral   Take 1 tablet by mouth daily.         . cyclobenzaprine (FLEXERIL) 10 MG tablet   Oral   Take 1 tablet (10 mg total) by mouth at bedtime.   10 tablet   1   . docusate sodium (COLACE) 100 MG capsule   Oral   Take 100 mg by mouth daily.         Marland Kitchen estradiol (ESTRACE) 0.5 MG tablet      TAKE 1/2 TO 1 TABLET DAILY AS NEEDED   90 tablet   1   . gemfibrozil (LOPID) 600 MG tablet      TAKE 1 TABLET TWICE DAILY Patient not taking: Reported on 01/10/2015   180 tablet   4   . glipiZIDE (GLUCOTROL) 10 MG tablet   Oral   Take 10 mg by mouth 2 (two) times daily.         Marland Kitchen ibuprofen (ADVIL,MOTRIN) 200 MG tablet   Oral   Take 600 mg by mouth as needed. Reported on 01/10/2015         .  lisinopril-hydrochlorothiazide (PRINZIDE,ZESTORETIC) 20-12.5 MG per tablet      TAKE 2 TABLETS EVERY DAY   180 tablet   4   . lovastatin (MEVACOR) 40 MG tablet      TAKE 1 TABLET EVERY DAY   90 tablet   4   . metFORMIN (GLUCOPHAGE) 1000 MG tablet      TAKE 1 TABLET TWICE DAILY   180 tablet   4   . Omega-3 1000 MG CAPS   Oral   Take 1 capsule by mouth daily.          Marland Kitchen omeprazole (PRILOSEC) 20 MG capsule      TAKE 1 CAPSULE EVERY DAY   90 capsule   3   . omeprazole (PRILOSEC) 40 MG capsule   Oral   Take 40 mg by mouth daily.         . vitamin E (E-400) 400 UNIT capsule   Oral   Take 1 capsule by mouth daily.           Allergies Celecoxib  Family History  Problem Relation Age of Onset  . Kidney failure Mother   . Aneurysm Father   . Hypertension Sister   . Diabetes Sister   . Diabetes Brother   . Hypertension Brother   . Diabetes Sister   . Hypertension Sister   . Diabetes Brother   . Hypertension Brother      Social History Social History  Substance Use Topics  . Smoking status: Never Smoker   . Smokeless tobacco: Never Used  . Alcohol Use: No    Review of Systems  Constitutional: Negative for fever.  ENT: Negative for neck pain  GI: Negative for abdominal pain Genitourinary: Negative for dysuria. No frequency Musculoskeletal: As above for back pain Skin: Negative for rash. Neurological: Negative for focal weakness or sensory changes   ____________________________________________   PHYSICAL EXAM:  VITAL SIGNS: ED Triage Vitals  Enc Vitals Group     BP 02/19/15 1334 177/50 mmHg     Pulse Rate 02/19/15 1334 75     Resp 02/19/15 1334 18     Temp 02/19/15 1334 98.1 F (36.7 C)     Temp Source 02/19/15 1334 Oral     SpO2 02/19/15 1334 98 %     Weight 02/19/15 1334 180 lb (81.647 kg)     Height 02/19/15 1334 5\' 6"  (1.676 m)     Head Cir --      Peak Flow --      Pain Score 02/19/15 1334 8     Pain Loc --      Pain Edu? --      Excl. in [place]? --      Constitutional: Alert and oriented. Well appearing and in no distress. Eyes: Conjunctivae are normal.  ENT   Head: Normocephalic and atraumatic.   Mouth/Throat: Mucous membranes are moist. Cardiovascular: Normal rate, regular rhythm.  Respiratory: Normal respiratory effort without tachypnea nor retractions.  Gastrointestinal: Soft and non-tender in all quadrants. No distention. There is no CVA tenderness. No pulsatile mass, no bruit Musculoskeletal: Nontender with normal range of motion in all extremities. Patient with point tenderness in the right lumbar paraspinal area consistent with muscle spasm, palpating here reduces her pain exactly. Normal strength in the lower extremities Neurologic:  Normal speech and language. No gross focal neurologic deficits are appreciated. Skin:  Skin is warm, dry and intact. No rash noted. Psychiatric: Mood and affect are normal. Patient exhibits appropriate insight and  judgment.  ____________________________________________    LABS (pertinent positives/negatives)  Labs Reviewed  BASIC METABOLIC PANEL - Abnormal; Notable for the following:    Potassium 3.4 (*)    All other components within normal limits  URINALYSIS COMPLETEWITH MICROSCOPIC ([facility] ONLY) - Abnormal; Notable for the following:    Color, Urine YELLOW (*)    APPearance CLEAR (*)    Squamous Epithelial / LPF 0-5 (*)    All other components within normal limits  CBC    ____________________________________________     ____________________________________________    RADIOLOGY I have personally reviewed any xrays that were ordered on this patient: None  ____________________________________________   PROCEDURES  Procedure(s) performed: none   ____________________________________________   INITIAL IMPRESSION / ASSESSMENT AND PLAN / ED COURSE  Pertinent labs & imaging results that were available during my care of the patient were reviewed by me and considered in my medical decision making (see chart for details).  Exam and history of present illness is most consistent with paraspinal lumbar muscle spasm. We will treat this conservatively. I recommended Aleve twice a day, Tylenol when necessary and Flexeril daily at bedtime with PCP follow-up.  ____________________________________________   FINAL CLINICAL IMPRESSION(S) / ED DIAGNOSES  Final diagnoses:  Lumbar paraspinal muscle spasm     Lavonia Drafts, MD 02/19/15 1444

## 2015-02-19 NOTE — ED Notes (Signed)
Pt reports rt sided flank pain that began around 3 weeks ago, denies any other sx's. Pt denies dysuria.

## 2015-02-19 NOTE — Discharge Instructions (Signed)

## 2015-03-20 DIAGNOSIS — X32XXXA Exposure to sunlight, initial encounter: Secondary | ICD-10-CM | POA: Diagnosis not present

## 2015-03-20 DIAGNOSIS — L570 Actinic keratosis: Secondary | ICD-10-CM | POA: Diagnosis not present

## 2015-03-20 DIAGNOSIS — L821 Other seborrheic keratosis: Secondary | ICD-10-CM | POA: Diagnosis not present

## 2015-03-20 DIAGNOSIS — Z85828 Personal history of other malignant neoplasm of skin: Secondary | ICD-10-CM | POA: Diagnosis not present

## 2015-03-27 ENCOUNTER — Other Ambulatory Visit: Payer: Self-pay | Admitting: Family Medicine

## 2015-05-08 ENCOUNTER — Encounter: Payer: Self-pay | Admitting: Family Medicine

## 2015-05-08 ENCOUNTER — Ambulatory Visit (INDEPENDENT_AMBULATORY_CARE_PROVIDER_SITE_OTHER): Payer: Commercial Managed Care - HMO | Admitting: Family Medicine

## 2015-05-08 VITALS — BP 120/70 | HR 68 | Temp 97.5°F | Resp 16 | Ht 65.0 in | Wt 167.0 lb

## 2015-05-08 DIAGNOSIS — I10 Essential (primary) hypertension: Secondary | ICD-10-CM | POA: Diagnosis not present

## 2015-05-08 DIAGNOSIS — E2839 Other primary ovarian failure: Secondary | ICD-10-CM | POA: Diagnosis not present

## 2015-05-08 DIAGNOSIS — E119 Type 2 diabetes mellitus without complications: Secondary | ICD-10-CM

## 2015-05-08 LAB — POCT GLYCOSYLATED HEMOGLOBIN (HGB A1C)
ESTIMATED AVERAGE GLUCOSE: 146
Hemoglobin A1C: 6.7

## 2015-05-08 MED ORDER — GLIPIZIDE 10 MG PO TABS: 10.0000 mg | ORAL_TABLET | Freq: Every day | ORAL | Status: DC

## 2015-05-08 NOTE — Progress Notes (Signed)
Patient: Natalie Bray Female    DOB: 01-Nov-1934   81 y.o.   MRN: [id number] Visit Date: 05/08/2015  Today's Provider: Lelon Huh, MD   Chief Complaint  Patient presents with  . Follow-up  . Diabetes  . Hypertension   Subjective:    HPI    Follow-up for estrogen deficiency from 01/10/2015; bone density ordered. She states she called her insurance and was told BMD is not covered, so she didn't have it done.    Diabetes Mellitus Type II, Follow-up:   Lab Results  Component Value Date   HGBA1C 6.8 01/10/2015   HGBA1C 7.1 08/08/2014   HGBA1C 7.0* 05/10/2014   Last seen for diabetes 4 months ago.  Management since then includes; no changes, continue current medication. She reports good compliance with treatment. She is having side effects. hyperglycemia Current symptoms include none and have been stable. Home blood sugar records: fasting range: 100  Episodes of hypoglycemia? yes - 1-2 times a week   Current Insulin Regimen: none Most Recent Eye Exam: due in June Weight trend: stable Prior visit with dietician: no Current diet: well balanced Current exercise: none  ----------------------------------------------------------------------     Hypertension, follow-up:  BP Readings from Last 3 Encounters:  05/08/15 120/70  02/19/15 154/64  01/10/15 112/58    She was last seen for hypertension 4 months ago.  BP at that visit was 112/58. Management since that visit includes; no changes.She reports good compliance with treatment. She is not having side effects. none  She is not exercising. She is adherent to low salt diet.   Outside blood pressures are 150/70. She is experiencing none.  Patient denies none.   Cardiovascular risk factors include none.  Use of agents associated with hypertension: none.   ----------------------------------------------------------------------   Patient has been having episodes were her blood sugar is dropping (58-80)  1-2 times a week.   Allergies  Allergen Reactions  . Celecoxib Rash    Flushed red   Previous Medications   ACCU-CHEK AVIVA PLUS TEST STRIP    TEST EVERY DAY   ACETAMINOPHEN (TYLENOL ARTHRITIS PAIN PO)    Take by mouth daily as needed.   AMLODIPINE (NORVASC) 10 MG TABLET    TAKE 1 TABLET EVERY DAY   ASPIRIN 81 MG CHEWABLE TABLET    Chew 81 mg by mouth daily.   CALCIUM-VITAMIN D (CALCIUM 500/D) 500-200 MG-UNIT PER TABLET    Take 1 tablet by mouth daily.   DOCUSATE SODIUM (COLACE) 100 MG CAPSULE    Take 100 mg by mouth daily.   ESTRADIOL (ESTRACE) 0.5 MG TABLET    TAKE 1/2 TO 1 TABLET DAILY AS NEEDED   GLIPIZIDE (GLUCOTROL) 10 MG TABLET    Take 10 mg by mouth 2 (two) times daily.   IBUPROFEN (ADVIL,MOTRIN) 200 MG TABLET    Take 600 mg by mouth as needed. Reported on 01/10/2015   LISINOPRIL-HYDROCHLOROTHIAZIDE (PRINZIDE,ZESTORETIC) 20-12.5 MG PER TABLET    TAKE 2 TABLETS EVERY DAY   LOVASTATIN (MEVACOR) 40 MG TABLET    TAKE 1 TABLET EVERY DAY   METFORMIN (GLUCOPHAGE) 1000 MG TABLET    TAKE 1 TABLET TWICE DAILY   OMEGA-3 1000 MG CAPS    Take 1 capsule by mouth daily.    OMEPRAZOLE (PRILOSEC) 40 MG CAPSULE    Take 40 mg by mouth daily. Reported on 05/08/2015   VITAMIN E (E-400) 400 UNIT CAPSULE    Take 1 capsule by mouth daily.  Review of Systems  Constitutional: Negative for fever, chills, appetite change and fatigue.  Respiratory: Negative for chest tightness and shortness of breath.   Cardiovascular: Negative for chest pain and palpitations.  Gastrointestinal: Negative for nausea, vomiting and abdominal pain.  Neurological: Negative for dizziness and weakness.    Social History  Substance Use Topics  . Smoking status: Never Smoker   . Smokeless tobacco: Never Used  . Alcohol Use: No   Objective:   BP 120/70 mmHg  Pulse 68  Temp(Src) 97.5 F (36.4 C) (Oral)  Resp 16  Ht 5\' 5"  (1.651 m)  Wt 167 lb (75.751 kg)  BMI 27.79 kg/m2  Physical Exam   General Appearance:     Alert, cooperative, no distress  Eyes:    PERRL, conjunctiva/corneas clear, EOM's intact       Lungs:     Clear to auscultation bilaterally, respirations unlabored  Heart:    Regular rate and rhythm, no carotid bruits.   Neurologic:   Awake, alert, oriented x 3. No apparent focal neurological           defect.         Results for orders placed or performed in visit on 05/08/15  POCT glycosylated hemoglobin (Hb A1C)  Result Value Ref Range   Hemoglobin A1C 6.7    Est. average glucose Bld gHb Est-mCnc 146        Assessment & Plan:     1. Type 2 diabetes mellitus without complication, without long-term current use of insulin (HCC) Is haivng occasional hypoglycemia, will reduced glipizide to QD.  - POCT glycosylated hemoglobin (Hb A1C) - glipiZIDE (GLUCOTROL) 10 MG tablet; Take 1 tablet (10 mg total) by mouth daily before breakfast.  Dispense: 1 tablet; Refill: 0   2. Essential hypertension Well controlled.  Continue current medications.     3. Estrogen deficiency Will check with insurance regarding coverage for BMD.       Lelon Huh, MD  [place]

## 2015-07-03 ENCOUNTER — Other Ambulatory Visit: Payer: Self-pay | Admitting: Family Medicine

## 2015-07-03 DIAGNOSIS — E119 Type 2 diabetes mellitus without complications: Secondary | ICD-10-CM

## 2015-07-03 MED ORDER — GLIPIZIDE 10 MG PO TABS: 10.0000 mg | ORAL_TABLET | Freq: Every day | ORAL | Status: DC

## 2015-08-11 DIAGNOSIS — E119 Type 2 diabetes mellitus without complications: Secondary | ICD-10-CM | POA: Diagnosis not present

## 2015-08-11 LAB — HM DIABETES EYE EXAM

## 2015-08-15 ENCOUNTER — Encounter: Payer: Self-pay | Admitting: *Deleted

## 2015-09-11 ENCOUNTER — Ambulatory Visit (INDEPENDENT_AMBULATORY_CARE_PROVIDER_SITE_OTHER): Payer: Commercial Managed Care - HMO | Admitting: Family Medicine

## 2015-09-11 ENCOUNTER — Encounter: Payer: Self-pay | Admitting: Family Medicine

## 2015-09-11 VITALS — BP 130/60 | HR 78 | Temp 97.6°F | Resp 16 | Wt 168.0 lb

## 2015-09-11 DIAGNOSIS — I10 Essential (primary) hypertension: Secondary | ICD-10-CM | POA: Diagnosis not present

## 2015-09-11 DIAGNOSIS — E119 Type 2 diabetes mellitus without complications: Secondary | ICD-10-CM | POA: Diagnosis not present

## 2015-09-11 LAB — POCT GLYCOSYLATED HEMOGLOBIN (HGB A1C)
Est. average glucose Bld gHb Est-mCnc: 151
HEMOGLOBIN A1C: 6.9

## 2015-09-11 LAB — POCT UA - MICROALBUMIN: Microalbumin Ur, POC: 50 mg/L

## 2015-09-11 NOTE — Progress Notes (Signed)
Patient: Natalie Bray Female    DOB: Sep 12, 1934   81 y.o.   MRN: [id number] Visit Date: 09/11/2015  Today's Provider: Lelon Huh, MD   Chief Complaint  Patient presents with  . Hypertension    follow up  . Diabetes    follow up   Subjective:    HPI  Hypertension, follow-up:  BP Readings from Last 3 Encounters:  05/08/15 120/70  02/19/15 (!) 154/64  01/10/15 (!) 112/58    She was last seen for hypertension 4 months ago.  BP at that visit was 120/70. Management since that visit includes no changes. She reports good compliance with treatment. She is not having side effects.  She is not exercising. She is adherent to low salt diet.   Outside blood pressures are [id number] (systolic) over 80 (diastolic). She is experiencing lower extremity edema.  Patient denies chest pain, chest pressure/discomfort, claudication, dyspnea, exertional chest pressure/discomfort, fatigue, irregular heart beat, near-syncope, orthopnea, palpitations, paroxysmal nocturnal dyspnea, syncope and tachypnea.   Cardiovascular risk factors include advanced age (older than 83 for men, 48 for women), diabetes mellitus and hypertension.  Use of agents associated with hypertension: NSAIDS.     Weight trend: stable Wt Readings from Last 3 Encounters:  05/08/15 167 lb (75.8 kg)  02/19/15 180 lb (81.6 kg)  01/10/15 168 lb (76.2 kg)    Current diet: in general, a "healthy" diet    ------------------------------------------------------------------------  Diabetes Mellitus Type II, Follow-up:   Lab Results  Component Value Date   HGBA1C 6.7 05/08/2015   HGBA1C 6.8 01/10/2015   HGBA1C 7.1 08/08/2014    Last seen for diabetes 4 months ago.  Management since then includes decreasing Glipizide to once a day due to occasional hypoglycemia. She reports good compliance with treatment. Patient reports she sometimes takes Metformin only once a day if her sugars are not high. She is not having side  effects.  Current symptoms include none and have been stable. Home blood sugar records: 150-200 at night  Episodes of hypoglycemia? yes - occurs once a week   Current Insulin Regimen: none Most Recent Eye Exam: 08/11/2015 (Dr. Jeni Salles) Weight trend: stable Prior visit with dietician: no Current diet: in general, a "healthy" diet   Current exercise: none  Pertinent Labs:    Component Value Date/Time   CHOL 217 (H) 08/08/2014 0921   TRIG 341 (H) 08/08/2014 0921   HDL 51 08/08/2014 0921   LDLCALC 98 08/08/2014 0921   CREATININE 0.65 02/19/2015 1337   CREATININE 0.59 (L) 07/23/2011 0606    Wt Readings from Last 3 Encounters:  05/08/15 167 lb (75.8 kg)  02/19/15 180 lb (81.6 kg)  01/10/15 168 lb (76.2 kg)    ------------------------------------------------------------------------ Follow up Estrogen Deficiency:  Patient was last seen for this problem 4 months ago and no changes were made. BMD was recommended and patient was to check insurance regarding coverage.     Allergies  Allergen Reactions  . Celecoxib Rash    Flushed red   Current Meds  Medication Sig  . ACCU-CHEK AVIVA PLUS test strip TEST EVERY DAY  . Acetaminophen (TYLENOL ARTHRITIS PAIN PO) Take by mouth daily as needed.  Marland Kitchen amLODipine (NORVASC) 10 MG tablet TAKE 1 TABLET EVERY DAY  . aspirin 81 MG chewable tablet Chew 81 mg by mouth daily.  . calcium-vitamin D (CALCIUM 500/D) 500-200 MG-UNIT per tablet Take 1 tablet by mouth daily.  Marland Kitchen docusate sodium (COLACE) 100 MG capsule Take 100 mg  by mouth daily.  Marland Kitchen estradiol (ESTRACE) 0.5 MG tablet TAKE 1/2 TO 1 TABLET DAILY AS NEEDED  . glipiZIDE (GLUCOTROL) 10 MG tablet Take 1 tablet (10 mg total) by mouth daily before breakfast.  . ibuprofen (ADVIL,MOTRIN) 200 MG tablet Take 600 mg by mouth as needed. Reported on 01/10/2015  . lisinopril-hydrochlorothiazide (PRINZIDE,ZESTORETIC) 20-12.5 MG per tablet TAKE 2 TABLETS EVERY DAY  . lovastatin (MEVACOR) 40 MG tablet  TAKE 1 TABLET EVERY DAY  . metFORMIN (GLUCOPHAGE) 1000 MG tablet TAKE 1 TABLET TWICE DAILY  . Omega-3 1000 MG CAPS Take 1 capsule by mouth daily.   Marland Kitchen omeprazole (PRILOSEC) 40 MG capsule Take 40 mg by mouth daily. Reported on 05/08/2015  . vitamin E (E-400) 400 UNIT capsule Take 1 capsule by mouth daily.    Review of Systems  Constitutional: Negative for appetite change, chills, fatigue and fever.  Respiratory: Negative for chest tightness and shortness of breath.   Cardiovascular: Positive for leg swelling (in feet and legs). Negative for chest pain and palpitations.  Gastrointestinal: Negative for abdominal pain, nausea and vomiting.  Endocrine: Negative for cold intolerance, heat intolerance, polydipsia, polyphagia and polyuria.  Neurological: Negative for dizziness and weakness.    Social History  Substance Use Topics  . Smoking status: Never Smoker  . Smokeless tobacco: Never Used  . Alcohol use No   Objective:   BP 130/60 (BP Location: Left Arm, Patient Position: Sitting, Cuff Size: Large)   Pulse 78   Temp 97.6 F (36.4 C) (Oral)   Resp 16   Wt 168 lb (76.2 kg)   BMI 27.96 kg/m   Physical Exam   General Appearance:    Alert, cooperative, no distress  Eyes:    PERRL, conjunctiva/corneas clear, EOM's intact       Lungs:     Clear to auscultation bilaterally, respirations unlabored  Heart:    Regular rate and rhythm  Neurologic:   Awake, alert, oriented x 3. No apparent focal neurological           defect.       Results for orders placed or performed in visit on 09/11/15  POCT HgB A1C  Result Value Ref Range   Hemoglobin A1C 6.9    Est. average glucose Bld gHb Est-mCnc 151   POCT UA - Microalbumin  Result Value Ref Range   Microalbumin Ur, POC 50 mg/L   Creatinine, POC n/a mg/dL   Albumin/Creatinine Ratio, Urine, POC n/a         Assessment & Plan:     1. Type 2 diabetes mellitus without complication, without long-term current use of insulin (HCC) Well  controlled.  Continue current medications.   - POCT HgB A1C - POCT UA - Microalbumin  2. Essential hypertension Well controlled.  Continue current medications.    Return in about 5 months (around 02/11/2016).      The entirety of the information documented in the History of Present Illness, Review of Systems and Physical Exam were personally obtained by me. Portions of this information were initially documented by Meyer Cory, CMA and reviewed by me for thoroughness and accuracy.    Lelon Huh, MD  [facility]

## 2015-09-18 DIAGNOSIS — D485 Neoplasm of uncertain behavior of skin: Secondary | ICD-10-CM | POA: Diagnosis not present

## 2015-09-18 DIAGNOSIS — X32XXXA Exposure to sunlight, initial encounter: Secondary | ICD-10-CM | POA: Diagnosis not present

## 2015-09-18 DIAGNOSIS — C44329 Squamous cell carcinoma of skin of other parts of face: Secondary | ICD-10-CM | POA: Diagnosis not present

## 2015-09-18 DIAGNOSIS — L570 Actinic keratosis: Secondary | ICD-10-CM | POA: Diagnosis not present

## 2015-09-18 DIAGNOSIS — D0462 Carcinoma in situ of skin of left upper limb, including shoulder: Secondary | ICD-10-CM | POA: Diagnosis not present

## 2015-09-18 DIAGNOSIS — C44719 Basal cell carcinoma of skin of left lower limb, including hip: Secondary | ICD-10-CM | POA: Diagnosis not present

## 2015-09-18 DIAGNOSIS — Z85828 Personal history of other malignant neoplasm of skin: Secondary | ICD-10-CM | POA: Diagnosis not present

## 2015-09-18 DIAGNOSIS — C4441 Basal cell carcinoma of skin of scalp and neck: Secondary | ICD-10-CM | POA: Diagnosis not present

## 2015-09-18 DIAGNOSIS — L820 Inflamed seborrheic keratosis: Secondary | ICD-10-CM | POA: Diagnosis not present

## 2015-09-18 DIAGNOSIS — L538 Other specified erythematous conditions: Secondary | ICD-10-CM | POA: Diagnosis not present

## 2015-09-18 DIAGNOSIS — L298 Other pruritus: Secondary | ICD-10-CM | POA: Diagnosis not present

## 2015-09-18 DIAGNOSIS — Z08 Encounter for follow-up examination after completed treatment for malignant neoplasm: Secondary | ICD-10-CM | POA: Diagnosis not present

## 2015-10-05 ENCOUNTER — Ambulatory Visit (INDEPENDENT_AMBULATORY_CARE_PROVIDER_SITE_OTHER): Payer: Commercial Managed Care - HMO

## 2015-10-05 DIAGNOSIS — Z23 Encounter for immunization: Secondary | ICD-10-CM | POA: Diagnosis not present

## 2015-10-16 ENCOUNTER — Other Ambulatory Visit: Payer: Self-pay | Admitting: Family Medicine

## 2015-11-01 ENCOUNTER — Ambulatory Visit: Payer: Commercial Managed Care - HMO | Admitting: Family Medicine

## 2015-11-06 DIAGNOSIS — D0439 Carcinoma in situ of skin of other parts of face: Secondary | ICD-10-CM | POA: Diagnosis not present

## 2015-11-06 DIAGNOSIS — C44329 Squamous cell carcinoma of skin of other parts of face: Secondary | ICD-10-CM | POA: Diagnosis not present

## 2015-11-13 DIAGNOSIS — D0462 Carcinoma in situ of skin of left upper limb, including shoulder: Secondary | ICD-10-CM | POA: Diagnosis not present

## 2015-11-13 DIAGNOSIS — C4441 Basal cell carcinoma of skin of scalp and neck: Secondary | ICD-10-CM | POA: Diagnosis not present

## 2016-01-18 ENCOUNTER — Telehealth: Payer: Self-pay | Admitting: Family Medicine

## 2016-01-18 NOTE — Telephone Encounter (Signed)
Called Pt to re-schedule f/up with Dr. Caryn Section and make new appts for husband and wife for their AWV with [facility] starting at 1/18 at 8:45am - knb

## 2016-02-12 ENCOUNTER — Ambulatory Visit (INDEPENDENT_AMBULATORY_CARE_PROVIDER_SITE_OTHER): Payer: Medicare HMO | Admitting: Family Medicine

## 2016-02-12 ENCOUNTER — Encounter: Payer: Self-pay | Admitting: Family Medicine

## 2016-02-12 VITALS — BP 120/60 | HR 65 | Temp 97.8°F | Resp 16 | Ht 65.0 in | Wt 167.0 lb

## 2016-02-12 DIAGNOSIS — I10 Essential (primary) hypertension: Secondary | ICD-10-CM

## 2016-02-12 DIAGNOSIS — R609 Edema, unspecified: Secondary | ICD-10-CM | POA: Diagnosis not present

## 2016-02-12 DIAGNOSIS — D509 Iron deficiency anemia, unspecified: Secondary | ICD-10-CM | POA: Diagnosis not present

## 2016-02-12 DIAGNOSIS — E785 Hyperlipidemia, unspecified: Secondary | ICD-10-CM

## 2016-02-12 DIAGNOSIS — E119 Type 2 diabetes mellitus without complications: Secondary | ICD-10-CM

## 2016-02-12 DIAGNOSIS — R200 Anesthesia of skin: Secondary | ICD-10-CM | POA: Diagnosis not present

## 2016-02-12 DIAGNOSIS — E2839 Other primary ovarian failure: Secondary | ICD-10-CM | POA: Diagnosis not present

## 2016-02-12 LAB — POCT GLYCOSYLATED HEMOGLOBIN (HGB A1C)
Est. average glucose Bld gHb Est-mCnc: 143
Hemoglobin A1C: 6.6

## 2016-02-12 NOTE — Progress Notes (Signed)
Patient: Natalie Bray Female    DOB: 1934/02/27   82 y.o.   MRN: [id number] Visit Date: 02/12/2016  Today's Provider: Lelon Huh, MD   Chief Complaint  Patient presents with  . Follow-up  . Diabetes  . Hypertension   Subjective:    Patient has been having some slight swelling in her ankles and hands.   Swelling is mainly when she is working on her feet for long periods of time. Also notices some numbness in her feet when she is on them for long periods. No dyspnea. No chest pains. Feels well otherwise.    Diabetes Mellitus Type II, Follow-up:   Lab Results  Component Value Date   HGBA1C 6.9 09/11/2015   HGBA1C 6.7 05/08/2015   HGBA1C 6.8 01/10/2015   Last seen for diabetes 5 months ago.  Management since then includes; no changes. She reports good compliance with treatment. She is not having side effects. none Current symptoms include none and have been worsening. Home blood sugar records: fasting range: 98-199  Episodes of hypoglycemia? no   Current Insulin Regimen: n/a Most Recent Eye Exam: 07/23/15 Weight trend: stable Prior visit with dietician: no Current diet: well balanced Current exercise: walking  ----------------------------------------------------------------   Hypertension, follow-up:  BP Readings from Last 3 Encounters:  02/12/16 120/60  09/11/15 130/60  05/08/15 120/70    She was last seen for hypertension 5 months ago.  BP at that visit was 130/60. Management since that visit includes; no changes.She reports good compliance with treatment. She is not having side effects. none She is exercising. She is adherent to low salt diet.   Outside blood pressures are 147/65. She is experiencing ankles and hands have been swelling  Patient denies none.   Cardiovascular risk factors include diabetes mellitus.  Use of agents associated with hypertension: none.    ----------------------------------------------------------------    Allergies  Allergen Reactions  . Celecoxib Rash    Flushed red     Current Outpatient Prescriptions:  .  ACCU-CHEK AVIVA PLUS test strip, TEST EVERY DAY, Disp: 100 each, Rfl: 4 .  Acetaminophen (TYLENOL ARTHRITIS PAIN PO), Take by mouth daily as needed., Disp: , Rfl:  .  amLODipine (NORVASC) 10 MG tablet, TAKE 1 TABLET EVERY DAY, Disp: 90 tablet, Rfl: 4 .  aspirin 81 MG chewable tablet, Chew 81 mg by mouth daily., Disp: , Rfl:  .  calcium-vitamin D (CALCIUM 500/D) 500-200 MG-UNIT per tablet, Take 1 tablet by mouth daily., Disp: , Rfl:  .  docusate sodium (COLACE) 100 MG capsule, Take 100 mg by mouth daily., Disp: , Rfl:  .  estradiol (ESTRACE) 0.5 MG tablet, TAKE 1/2 TO 1 TABLET DAILY AS NEEDED, Disp: 90 tablet, Rfl: 4 .  glipiZIDE (GLUCOTROL) 10 MG tablet, Take 1 tablet (10 mg total) by mouth daily before breakfast., Disp: 90 tablet, Rfl: 3 .  ibuprofen (ADVIL,MOTRIN) 200 MG tablet, Take 600 mg by mouth as needed. Reported on 01/10/2015, Disp: , Rfl:  .  lisinopril-hydrochlorothiazide (PRINZIDE,ZESTORETIC) 20-12.5 MG tablet, TAKE 2 TABLETS EVERY DAY, Disp: 180 tablet, Rfl: 4 .  lovastatin (MEVACOR) 40 MG tablet, TAKE 1 TABLET EVERY DAY, Disp: 90 tablet, Rfl: 4 .  metFORMIN (GLUCOPHAGE) 1000 MG tablet, TAKE 1 TABLET TWICE DAILY, Disp: 180 tablet, Rfl: 4 .  Omega-3 1000 MG CAPS, Take 1 capsule by mouth daily. , Disp: , Rfl:  .  omeprazole (PRILOSEC) 20 MG capsule, TAKE 1 CAPSULE EVERY DAY, Disp: 90 capsule, Rfl: 3 .  omeprazole (PRILOSEC) 40 MG capsule, Take 40 mg by mouth daily. Reported on 05/08/2015, Disp: , Rfl:  .  vitamin E (E-400) 400 UNIT capsule, Take 1 capsule by mouth daily., Disp: , Rfl:   Review of Systems  Constitutional: Negative for appetite change, chills, fatigue and fever.  Respiratory: Negative for chest tightness and shortness of breath.   Cardiovascular: Positive for leg swelling. Negative for  chest pain and palpitations.  Gastrointestinal: Negative for abdominal pain, nausea and vomiting.  Neurological: Negative for dizziness and weakness.    Social History  Substance Use Topics  . Smoking status: Never Smoker  . Smokeless tobacco: Never Used  . Alcohol use No   Objective:   BP 120/60 (BP Location: Right Arm, Patient Position: Sitting, Cuff Size: Large)   Pulse 65   Temp 97.8 F (36.6 C) (Oral)   Resp 16   Ht 5\' 5"  (1.651 m)   Wt 167 lb (75.8 kg)   BMI 27.79 kg/m    Fall Risk  02/12/2016 01/10/2015  Falls in the past year? No No     Physical Exam   General Appearance:    Alert, cooperative, no distress  Eyes:    PERRL, conjunctiva/corneas clear, EOM's intact       Lungs:     Clear to auscultation bilaterally, respirations unlabored  Heart:    Regular rate and rhythm, trace bipedal edema.   Neurologic:   Awake, alert, oriented x 3. No apparent focal neurological           defect.       Results for orders placed or performed in visit on 02/12/16  POCT glycosylated hemoglobin (Hb A1C)  Result Value Ref Range   Hemoglobin A1C 6.6    Est. average glucose Bld gHb Est-mCnc 143        Assessment & Plan:      1. Type 2 diabetes mellitus without complication, without long-term current use of insulin (HCC) Well controlled.  Continue current medications.   - POCT glycosylated hemoglobin (Hb A1C)  2. Essential hypertension Stable Continue current medications.   - Renal function panel  3. Iron deficiency anemia, unspecified iron deficiency anemia type  - CBC  4. Estrogen deficiency  - DG Bone Density; Future  5. Hyperlipidemia, unspecified hyperlipidemia type She is tolerating lovastatin well with no adverse effects.    - Lipid panel - TSH - Hepatic function panel  6. Edema, unspecified type   7. Numbness in feet  - Vitamin B12     The entirety of the information documented in the History of Present Illness, Review of Systems and Physical Exam  were personally obtained by me. Portions of this information were initially documented by Theressa Millard, CMA and reviewed by me for thoroughness and accuracy.    Lelon Huh, MD  [facility]

## 2016-02-13 LAB — LIPID PANEL
CHOL/HDL RATIO: 3.7 ratio (ref 0.0–4.4)
CHOLESTEROL TOTAL: 198 mg/dL (ref 100–199)
HDL: 54 mg/dL (ref >39)
LDL CALC: 95 mg/dL (ref 0–99)
TRIGLYCERIDES: 244 mg/dL — AB (ref 0–149)
VLDL CHOLESTEROL CAL: 49 mg/dL — AB (ref 5–40)

## 2016-02-13 LAB — VITAMIN B12: VITAMIN B 12: 337 pg/mL (ref 232–1245)

## 2016-02-13 LAB — RENAL FUNCTION PANEL
Albumin: 4.2 g/dL (ref 3.5–4.8)
BUN/Creatinine Ratio: 25 (ref 12–28)
BUN: 15 mg/dL (ref 8–27)
CALCIUM: 9.8 mg/dL (ref 8.7–10.3)
CO2: 28 mmol/L (ref 18–29)
CREATININE: 0.6 mg/dL (ref 0.57–1.00)
Chloride: 99 mmol/L (ref 96–106)
GFR calc Af Amer: 100 mL/min/{1.73_m2} (ref >59)
GFR, EST NON AFRICAN AMERICAN: 87 mL/min/{1.73_m2} (ref >59)
Glucose: 130 mg/dL — ABNORMAL HIGH (ref 65–99)
PHOSPHORUS: 3.3 mg/dL (ref 2.5–4.5)
Potassium: 3.9 mmol/L (ref 3.5–5.2)
Sodium: 143 mmol/L (ref 134–144)

## 2016-02-13 LAB — CBC
HEMOGLOBIN: 12.5 g/dL (ref 11.1–15.9)
Hematocrit: 39.4 % (ref 34.0–46.6)
MCH: 29.1 pg (ref 26.6–33.0)
MCHC: 31.7 g/dL (ref 31.5–35.7)
MCV: 92 fL (ref 79–97)
PLATELETS: 328 10*3/uL (ref 150–379)
RBC: 4.3 x10E6/uL (ref 3.77–5.28)
RDW: 14.4 % (ref 12.3–15.4)
WBC: 5.7 10*3/uL (ref 3.4–10.8)

## 2016-02-13 LAB — HEPATIC FUNCTION PANEL
ALT: 12 IU/L (ref 0–32)
AST: 14 IU/L (ref 0–40)
Alkaline Phosphatase: 68 IU/L (ref 39–117)
BILIRUBIN, DIRECT: 0.06 mg/dL (ref 0.00–0.40)
Bilirubin Total: <0.2 mg/dL (ref 0.0–1.2)
Total Protein: 7 g/dL (ref 6.0–8.5)

## 2016-02-13 LAB — TSH: TSH: 2.33 u[IU]/mL (ref 0.450–4.500)

## 2016-03-14 ENCOUNTER — Encounter: Payer: Self-pay | Admitting: Family Medicine

## 2016-03-14 ENCOUNTER — Other Ambulatory Visit: Payer: Self-pay | Admitting: Emergency Medicine

## 2016-03-14 ENCOUNTER — Ambulatory Visit
Admission: RE | Admit: 2016-03-14 | Discharge: 2016-03-14 | Disposition: A | Discharge status: Home / Self Care | Payer: Commercial Managed Care - HMO | Source: Ambulatory Visit | Attending: Family Medicine | Admitting: Family Medicine

## 2016-03-14 DIAGNOSIS — M810 Age-related osteoporosis without current pathological fracture: Secondary | ICD-10-CM | POA: Insufficient documentation

## 2016-03-14 DIAGNOSIS — E2839 Other primary ovarian failure: Secondary | ICD-10-CM | POA: Diagnosis not present

## 2016-03-14 MED ORDER — ALENDRONATE SODIUM 70 MG PO TABS: 70.0000 mg | ORAL_TABLET | ORAL | 4 refills | Status: DC

## 2016-03-20 ENCOUNTER — Telehealth: Payer: Self-pay | Admitting: Family Medicine

## 2016-03-20 NOTE — Telephone Encounter (Signed)
Called Pt to schedule AWV with [facility] - knb °

## 2016-03-29 ENCOUNTER — Other Ambulatory Visit: Payer: Self-pay | Admitting: Family Medicine

## 2016-05-20 ENCOUNTER — Telehealth: Payer: Self-pay | Admitting: Family Medicine

## 2016-05-20 ENCOUNTER — Other Ambulatory Visit: Payer: Self-pay | Admitting: Family Medicine

## 2016-05-20 DIAGNOSIS — Z1231 Encounter for screening mammogram for malignant neoplasm of breast: Secondary | ICD-10-CM

## 2016-05-20 NOTE — Telephone Encounter (Signed)
Patient was notified that she has an appt scheduled in [facility] for 06/13/16 at 1:40 for MM.

## 2016-05-20 NOTE — Telephone Encounter (Signed)
Pt is requesting an order sent to [facility] to have her mammogram done.  Pt has an appointment for 06/13/16.  CB#[phone]/MW

## 2016-06-12 ENCOUNTER — Ambulatory Visit (INDEPENDENT_AMBULATORY_CARE_PROVIDER_SITE_OTHER): Payer: Medicare HMO | Admitting: Family Medicine

## 2016-06-12 ENCOUNTER — Encounter: Payer: Self-pay | Admitting: Family Medicine

## 2016-06-12 VITALS — BP 130/60 | HR 73 | Temp 97.5°F | Resp 16 | Ht 65.0 in | Wt 166.0 lb

## 2016-06-12 DIAGNOSIS — E119 Type 2 diabetes mellitus without complications: Secondary | ICD-10-CM | POA: Diagnosis not present

## 2016-06-12 DIAGNOSIS — M810 Age-related osteoporosis without current pathological fracture: Secondary | ICD-10-CM

## 2016-06-12 DIAGNOSIS — R05 Cough: Secondary | ICD-10-CM | POA: Diagnosis not present

## 2016-06-12 DIAGNOSIS — R059 Cough, unspecified: Secondary | ICD-10-CM

## 2016-06-12 DIAGNOSIS — E785 Hyperlipidemia, unspecified: Secondary | ICD-10-CM | POA: Diagnosis not present

## 2016-06-12 LAB — POCT GLYCOSYLATED HEMOGLOBIN (HGB A1C)
Est. average glucose Bld gHb Est-mCnc: 140
Hemoglobin A1C: 6.5

## 2016-06-12 NOTE — Progress Notes (Signed)
Patient: Natalie Bray Female    DOB: 10/29/1934   82 y.o.   MRN: [id number] Visit Date: 06/12/2016  Today's Provider: Lelon Huh, MD   Chief Complaint  Patient presents with  . Follow-up  . Diabetes  . Hypertension  . Anemia   Subjective:    HPI   Diabetes Mellitus Type II, Follow-up:   Lab Results  Component Value Date   HGBA1C 6.5 06/12/2016   HGBA1C 6.6 02/12/2016   HGBA1C 6.9 09/11/2015   Last seen for diabetes 3 months ago.  Management since then includes; no changes. She reports good compliance with treatment. She is not having side effects. none Current symptoms include none and have been unchanged. Home blood sugar records: fasting range: 99  Episodes of hypoglycemia? no   Current Insulin Regimen: n/a Most Recent Eye Exam: due 07/2015 Weight trend: stable Prior visit with dietician: no Current diet: well balanced Current exercise: none  ----------------------------------------------------------------    Hypertension, follow-up:  BP Readings from Last 3 Encounters:  06/12/16 130/60  02/12/16 120/60  09/11/15 130/60    She was last seen for hypertension 3 months ago.  BP at that visit was 120/60. Management since that visit includes; no changes.She reports good compliance with treatment. She is not having side effects. none She is not exercising. She is adherent to low salt diet.   Outside blood pressures are 150/70. She is experiencing none.  Patient denies none.   Cardiovascular risk factors include diabetes mellitus.  Use of agents associated with hypertension: none.   ----------------------------------------------------------------   Iron deficiency anemia, unspecified iron deficiency anemia type Lab Results  Component Value Date   WBC 5.7 02/12/2016   HGB 12.7 02/19/2015   HCT 39.4 02/12/2016   MCV 92 02/12/2016   PLT 328 02/12/2016    Follow up osteoporosis Took a few doses of Fosamax, but stopped because she  felt like it hurt her head   Allergies  Allergen Reactions  . Celecoxib Rash    Flushed red   Depression screen [facility] 2/9 06/12/2016 01/10/2015  Decreased Interest 0 0  Down, Depressed, Hopeless 0 0  PHQ - 2 Score 0 0  Altered sleeping 0 -  Tired, decreased energy 0 -  Change in appetite 0 -  Feeling bad or failure about yourself  0 -  Trouble concentrating 0 -  Moving slowly or fidgety/restless 0 -  Suicidal thoughts 0 -  PHQ-9 Score 0 -  Difficult doing work/chores Not difficult at all -     Current Outpatient Prescriptions:  .  ACCU-CHEK AVIVA PLUS test strip, TEST BLOOD SUGAR EVERY DAY, Disp: 100 each, Rfl: 4 .  Acetaminophen (TYLENOL ARTHRITIS PAIN PO), Take by mouth daily as needed., Disp: , Rfl:  .  amLODipine (NORVASC) 10 MG tablet, TAKE 1 TABLET EVERY DAY, Disp: 90 tablet, Rfl: 4 .  aspirin 81 MG chewable tablet, Chew 81 mg by mouth daily., Disp: , Rfl:  .  calcium-vitamin D (CALCIUM 500/D) 500-200 MG-UNIT per tablet, Take 1 tablet by mouth daily., Disp: , Rfl:  .  docusate sodium (COLACE) 100 MG capsule, Take 100 mg by mouth daily., Disp: , Rfl:  .  estradiol (ESTRACE) 0.5 MG tablet, TAKE 1/2 TO 1 TABLET DAILY AS NEEDED, Disp: 90 tablet, Rfl: 4 .  glipiZIDE (GLUCOTROL) 10 MG tablet, Take 1 tablet (10 mg total) by mouth daily before breakfast., Disp: 90 tablet, Rfl: 3 .  ibuprofen (ADVIL,MOTRIN) 200 MG tablet, Take 600  mg by mouth as needed. Reported on 01/10/2015, Disp: , Rfl:  .  lisinopril-hydrochlorothiazide (PRINZIDE,ZESTORETIC) 20-12.5 MG tablet, TAKE 2 TABLETS EVERY DAY, Disp: 180 tablet, Rfl: 4 .  lovastatin (MEVACOR) 40 MG tablet, TAKE 1 TABLET EVERY DAY, Disp: 90 tablet, Rfl: 4 .  metFORMIN (GLUCOPHAGE) 1000 MG tablet, TAKE 1 TABLET TWICE DAILY, Disp: 180 tablet, Rfl: 4 .  Omega-3 1000 MG CAPS, Take 1 capsule by mouth daily. , Disp: , Rfl:  .  omeprazole (PRILOSEC) 20 MG capsule, TAKE 1 CAPSULE EVERY DAY, Disp: 90 capsule, Rfl: 3 .  vitamin E (E-400) 400 UNIT  capsule, Take 1 capsule by mouth daily., Disp: , Rfl:  .  alendronate (FOSAMAX) 70 MG tablet, Take 1 tablet (70 mg total) by mouth once a week. Take with a full glass of water on an empty stomach. (Patient not taking: Reported on 06/12/2016), Disp: 12 tablet, Rfl: 4  Review of Systems  Constitutional: Negative for appetite change, chills, fatigue and fever.  Respiratory: Positive for cough. Negative for chest tightness and shortness of breath.   Cardiovascular: Negative for chest pain and palpitations.  Gastrointestinal: Negative for abdominal pain, nausea and vomiting.  Neurological: Negative for dizziness and weakness.    Social History  Substance Use Topics  . Smoking status: Never Smoker  . Smokeless tobacco: Never Used  . Alcohol use No   Objective:   BP 130/60 (BP Location: Left Arm, Patient Position: Sitting, Cuff Size: Normal)   Pulse 73   Temp 97.5 F (36.4 C) (Oral)   Resp 16   Ht 5\' 5"  (1.651 m)   Wt 166 lb (75.3 kg)   SpO2 98%   BMI 27.62 kg/m   Fall Risk  06/12/2016 02/12/2016 01/10/2015  Falls in the past year? No No No   Depression screen [facility] 2/9 06/12/2016 01/10/2015  Decreased Interest 0 0  Down, Depressed, Hopeless 0 0  PHQ - 2 Score 0 0  Altered sleeping 0 -  Tired, decreased energy 0 -  Change in appetite 0 -  Feeling bad or failure about yourself  0 -  Trouble concentrating 0 -  Moving slowly or fidgety/restless 0 -  Suicidal thoughts 0 -  PHQ-9 Score 0 -  Difficult doing work/chores Not difficult at all -   Functional Status Survey: Is the patient deaf or have difficulty hearing?: No Does the patient have difficulty seeing, even when wearing glasses/contacts?: No Does the patient have difficulty concentrating, remembering, or making decisions?: No Does the patient have difficulty walking or climbing stairs?: No Does the patient have difficulty dressing or bathing?: No Does the patient have difficulty doing errands alone such as visiting a doctor's  office or shopping?: No   Physical Exam   General Appearance:    Alert, cooperative, no distress  Eyes:    PERRL, conjunctiva/corneas clear, EOM's intact       Lungs:     Clear to auscultation bilaterally, respirations unlabored  Heart:    Regular rate and rhythm  Neurologic:   Awake, alert, oriented x 3. No apparent focal neurological           defect.       Results for orders placed or performed in visit on 06/12/16  POCT glycosylated hemoglobin (Hb A1C)  Result Value Ref Range   Hemoglobin A1C 6.5    Est. average glucose Bld gHb Est-mCnc 140         Assessment & Plan:     1. Type 2 diabetes  mellitus without complication, without long-term current use of insulin ([facility]) Well controlled.  Continue current medications.   - POCT glycosylated hemoglobin (Hb A1C)  2. Hyperlipidemia, unspecified hyperlipidemia type She is tolerating lovastatin well with no adverse effects.    3. Osteoporosis, unspecified osteoporosis type, unspecified pathological fracture presence Intolerant to Fosamax. Recommended trial of Actonel, but she declined for the time being.   4. Cough Is having a lot of allergy symptoms including clear discharge and congestion. She will call if cough changes or worsens.   Return in about 4 months (around 10/13/2016).       Lelon Huh, MD  [facility]

## 2016-06-20 ENCOUNTER — Telehealth: Payer: Self-pay | Admitting: Family Medicine

## 2016-06-24 ENCOUNTER — Other Ambulatory Visit: Payer: Self-pay

## 2016-06-24 DIAGNOSIS — E119 Type 2 diabetes mellitus without complications: Secondary | ICD-10-CM

## 2016-06-24 MED ORDER — GLIPIZIDE 10 MG PO TABS: 10.0000 mg | ORAL_TABLET | Freq: Every day | ORAL | 3 refills | Status: DC

## 2016-06-24 NOTE — Telephone Encounter (Signed)
Fax for refill from [facility]

## 2016-07-08 ENCOUNTER — Ambulatory Visit (INDEPENDENT_AMBULATORY_CARE_PROVIDER_SITE_OTHER): Payer: Commercial Managed Care - HMO | Admitting: Podiatry

## 2016-07-08 ENCOUNTER — Encounter: Payer: Self-pay | Admitting: Podiatry

## 2016-07-08 DIAGNOSIS — M2140 Flat foot [pes planus] (acquired), unspecified foot: Secondary | ICD-10-CM

## 2016-07-08 DIAGNOSIS — E119 Type 2 diabetes mellitus without complications: Secondary | ICD-10-CM

## 2016-07-08 DIAGNOSIS — Q6650 Congenital pes planus, unspecified foot: Secondary | ICD-10-CM | POA: Diagnosis not present

## 2016-07-08 NOTE — Progress Notes (Signed)
She presents today with chief complaint of plantar heel pain bilaterally. She states that she is getting a burning and stinging and itching pain that extends from the heels and to the midfoot.  Objective: Vital signs are stable she is alert and oriented 3. Pulses are palpable. Neurologic sensorium is intact. Deep tendon reflexes are intact. Muscle strength is normal bilateral. Orthopedic evaluation demonstrates on just distal to the ankle range of motion or crepitation. She has possible pes planus bilateral left greater than right. With eversion of the calcaneus.  Assessment: Plantar fasciitis and pes planus with burning and tingling possibly associate with neuropathy.  Plan: We will start her with orthotics and because of the severity of her pes planus and the prominence along the medial aspect of the left foot requesting Rick maker mold. He will also write the order an drop discharges.

## 2016-07-10 ENCOUNTER — Ambulatory Visit (INDEPENDENT_AMBULATORY_CARE_PROVIDER_SITE_OTHER): Payer: Commercial Managed Care - HMO | Admitting: Podiatry

## 2016-07-10 DIAGNOSIS — M722 Plantar fascial fibromatosis: Secondary | ICD-10-CM

## 2016-07-10 DIAGNOSIS — Q6650 Congenital pes planus, unspecified foot: Secondary | ICD-10-CM

## 2016-07-10 NOTE — Progress Notes (Signed)
Patient came in today for evaluation/casting for CMFO upon recommendation Dr. Milinda Pointer.  Patient has had previous FO and has worked for her, but they are 80 years old and have met their usefullness for this patient.   Patient complains of plantar fascia pain in arch that doesn't seem to be getting better.   Patient also has evidence of stage 1-2 PTTD (R) w/ prominent navicular and medial shift talus.   Goal is offer custom made foot orthosis offering arch support, forefoot cushioning and rear foot stability..  Plan on Everfeet:  Co-poly, spenco w/ 4* RF varus wedge and 4* FF valgus wedge.   Also plan on allowing navicular offload, RIGHT.Marland KitchenMarland Kitchen

## 2016-08-07 ENCOUNTER — Ambulatory Visit: Payer: Commercial Managed Care - HMO

## 2016-08-30 ENCOUNTER — Other Ambulatory Visit: Payer: Self-pay | Admitting: Family Medicine

## 2016-09-30 DIAGNOSIS — M25511 Pain in right shoulder: Secondary | ICD-10-CM | POA: Diagnosis not present

## 2016-09-30 DIAGNOSIS — M7541 Impingement syndrome of right shoulder: Secondary | ICD-10-CM | POA: Diagnosis not present

## 2016-10-07 DIAGNOSIS — H353131 Nonexudative age-related macular degeneration, bilateral, early dry stage: Secondary | ICD-10-CM | POA: Diagnosis not present

## 2016-10-07 DIAGNOSIS — E119 Type 2 diabetes mellitus without complications: Secondary | ICD-10-CM | POA: Diagnosis not present

## 2016-10-07 DIAGNOSIS — H04123 Dry eye syndrome of bilateral lacrimal glands: Secondary | ICD-10-CM | POA: Diagnosis not present

## 2016-10-15 ENCOUNTER — Ambulatory Visit (INDEPENDENT_AMBULATORY_CARE_PROVIDER_SITE_OTHER): Payer: Medicare HMO | Admitting: Family Medicine

## 2016-10-15 ENCOUNTER — Encounter: Payer: Self-pay | Admitting: Family Medicine

## 2016-10-15 VITALS — BP 126/78 | HR 72 | Temp 97.9°F | Resp 16 | Wt 162.0 lb

## 2016-10-15 DIAGNOSIS — N181 Chronic kidney disease, stage 1: Secondary | ICD-10-CM

## 2016-10-15 DIAGNOSIS — E1122 Type 2 diabetes mellitus with diabetic chronic kidney disease: Secondary | ICD-10-CM | POA: Diagnosis not present

## 2016-10-15 DIAGNOSIS — I10 Essential (primary) hypertension: Secondary | ICD-10-CM

## 2016-10-15 DIAGNOSIS — E785 Hyperlipidemia, unspecified: Secondary | ICD-10-CM | POA: Diagnosis not present

## 2016-10-15 DIAGNOSIS — R809 Proteinuria, unspecified: Secondary | ICD-10-CM | POA: Diagnosis not present

## 2016-10-15 DIAGNOSIS — J069 Acute upper respiratory infection, unspecified: Secondary | ICD-10-CM | POA: Diagnosis not present

## 2016-10-15 DIAGNOSIS — M810 Age-related osteoporosis without current pathological fracture: Secondary | ICD-10-CM

## 2016-10-15 LAB — POCT UA - MICROALBUMIN: Microalbumin Ur, POC: 50 mg/L

## 2016-10-15 LAB — POCT GLYCOSYLATED HEMOGLOBIN (HGB A1C)
ESTIMATED AVERAGE GLUCOSE: 143
HEMOGLOBIN A1C: 6.6

## 2016-10-15 NOTE — Progress Notes (Signed)
Patient: Natalie Bray Female    DOB: 1934-12-13   82 y.o.   MRN: [id number] Visit Date: 10/15/2016  Today's Provider: Lelon Huh, MD   Chief Complaint  Patient presents with  . Follow-up  . Diabetes  . URI  . Hyperlipidemia  . Hypertension   Subjective:    HPI  Diabetes Mellitus Type II, Follow-up:   Lab Results  Component Value Date   HGBA1C 6.6 10/15/2016   HGBA1C 6.5 06/12/2016   HGBA1C 6.6 02/12/2016    Last seen for diabetes 4 months ago.  Management since then includes no changes. She reports good compliance with treatment. She is not having side effects.  Current symptoms include none and have been stable. Home blood sugar records: 120 this morning.   Episodes of hypoglycemia? no   Current Insulin Regimen: none Most Recent Eye Exam: due Weight trend: stable Prior visit with dietician: no Current diet: well balanced Current exercise: none  Pertinent Labs:    Component Value Date/Time   CHOL 198 02/12/2016 0942   TRIG 244 (H) 02/12/2016 0942   HDL 54 02/12/2016 0942   LDLCALC 95 02/12/2016 0942   CREATININE 0.60 02/12/2016 0942   CREATININE 0.59 (L) 07/23/2011 0606    Wt Readings from Last 3 Encounters:  10/15/16 162 lb (73.5 kg)  06/12/16 166 lb (75.3 kg)  02/12/16 167 lb (75.8 kg)      Hypertension, follow-up:  BP Readings from Last 3 Encounters:  10/15/16 126/78  06/12/16 130/60  02/12/16 120/60    She was last seen for hypertension 4 months ago.  BP at that visit was 130/60. Management since that visit includes no changes. She reports good compliance with treatment. She is not having side effects.  She is not exercising. However, she does stay active. She is adherent to low salt diet.   Outside blood pressures are checked daily. She is experiencing none.  Patient denies exertional chest pressure/discomfort, lower extremity edema and palpitations.   Cardiovascular risk factors include diabetes mellitus and  dyslipidemia.     Weight trend: stable Wt Readings from Last 3 Encounters:  10/15/16 162 lb (73.5 kg)  06/12/16 166 lb (75.3 kg)  02/12/16 167 lb (75.8 kg)    Current diet: well balanced   Lipid/Cholesterol, Follow-up:   Last seen for this4 months ago.  Management changes since that visit include no changes. . Last Lipid Panel:    Component Value Date/Time   CHOL 198 02/12/2016 0942   TRIG 244 (H) 02/12/2016 0942   HDL 54 02/12/2016 0942   CHOLHDL 3.7 02/12/2016 0942   LDLCALC 95 02/12/2016 0942    Risk factors for vascular disease include diabetes mellitus and hypertension  She reports good compliance with treatment. She is not having side effects.  Current symptoms include none and have been stable. Weight trend: stable Prior visit with dietician: no Current diet: well balanced Current exercise: none  Wt Readings from Last 3 Encounters:  10/15/16 162 lb (73.5 kg)  06/12/16 166 lb (75.3 kg)  02/12/16 167 lb (75.8 kg)    Osteoporosis, follow up: Patient was last seen 4 months ago. No changes were made. She is currently taking OTC Vitamin D and calicum. She was intolerant to Fosamax. Actonel was offered on last OV, but patient declined.   URI symptoms: Patient reports that she has had runny nose, watery eyes, chills, and PND X 3 days. She denies any fever. She has taken OTC allergy medication with  minimal relief. She has also taken Tylenol to help with headaches.     Allergies  Allergen Reactions  . Celecoxib Rash    Flushed red     Current Outpatient Prescriptions:  .  ACCU-CHEK AVIVA PLUS test strip, TEST BLOOD SUGAR EVERY DAY, Disp: 100 each, Rfl: 4 .  Acetaminophen (TYLENOL ARTHRITIS PAIN PO), Take by mouth daily as needed., Disp: , Rfl:  .  amLODipine (NORVASC) 10 MG tablet, TAKE 1 TABLET EVERY DAY, Disp: 90 tablet, Rfl: 4 .  aspirin 81 MG chewable tablet, Chew 81 mg by mouth daily., Disp: , Rfl:  .  calcium-vitamin D (CALCIUM 500/D) 500-200 MG-UNIT  per tablet, Take 1 tablet by mouth daily., Disp: , Rfl:  .  docusate sodium (COLACE) 100 MG capsule, Take 100 mg by mouth daily., Disp: , Rfl:  .  glipiZIDE (GLUCOTROL) 10 MG tablet, Take 1 tablet (10 mg total) by mouth daily before breakfast., Disp: 90 tablet, Rfl: 3 .  ibuprofen (ADVIL,MOTRIN) 200 MG tablet, Take 600 mg by mouth as needed. Reported on 01/10/2015, Disp: , Rfl:  .  lisinopril-hydrochlorothiazide (PRINZIDE,ZESTORETIC) 20-12.5 MG tablet, TAKE 2 TABLETS EVERY DAY, Disp: 180 tablet, Rfl: 4 .  lovastatin (MEVACOR) 40 MG tablet, TAKE 1 TABLET EVERY DAY, Disp: 90 tablet, Rfl: 4 .  metFORMIN (GLUCOPHAGE) 1000 MG tablet, TAKE 1 TABLET TWICE DAILY, Disp: 180 tablet, Rfl: 4 .  Omega-3 1000 MG CAPS, Take 1 capsule by mouth daily. , Disp: , Rfl:  .  omeprazole (PRILOSEC) 20 MG capsule, TAKE 1 CAPSULE EVERY DAY, Disp: 90 capsule, Rfl: 3 .  vitamin E (E-400) 400 UNIT capsule, Take 1 capsule by mouth daily., Disp: , Rfl:   Review of Systems  Constitutional: Positive for activity change, chills and fatigue.  HENT: Positive for congestion, postnasal drip, sneezing and voice change.   Eyes: Positive for redness and itching.  Respiratory: Negative.   Cardiovascular: Negative for chest pain, palpitations and leg swelling.  Endocrine: Negative.   Musculoskeletal: Positive for arthralgias.  Allergic/Immunologic: Positive for environmental allergies.  Neurological: Positive for headaches.    Social History  Substance Use Topics  . Smoking status: Never Smoker  . Smokeless tobacco: Never Used  . Alcohol use No   Objective:   BP 126/78 (BP Location: Left Arm, Patient Position: Sitting, Cuff Size: Normal)   Pulse 72   Temp 97.9 F (36.6 C)   Resp 16   Wt 162 lb (73.5 kg)   BMI 26.96 kg/m     Physical Exam  General Appearance:    Alert, cooperative, no distress  HENT:   bilateral TM normal without fluid or infection, neck without nodes, throat normal without erythema or exudate, no  sinus tenderness, post nasal drip noted and nasal mucosa pale and congested  Eyes:    PERRL, conjunctiva/corneas clear, EOM's intact       Lungs:     Clear to auscultation bilaterally, respirations unlabored  Heart:    Regular rate and rhythm  Neurologic:   Awake, alert, oriented x 3. No apparent focal neurological           defect.       Results for orders placed or performed in visit on 10/15/16  POCT glycosylated hemoglobin (Hb A1C)  Result Value Ref Range   Hemoglobin A1C 6.6    Est. average glucose Bld gHb Est-mCnc 143   POCT UA - Microalbumin  Result Value Ref Range   Microalbumin Ur, POC 50 mg/L   Creatinine, POC  mg/dL   Albumin/Creatinine Ratio, Urine, POC          Assessment & Plan:     1. Type 2 diabetes mellitus with stage 1 chronic kidney disease, without long-term current use of insulin (HCC) Well controlled.  Continue current medications.   - POCT glycosylated hemoglobin (Hb A1C) - POCT UA - Microalbumin  2. Essential hypertension Well controlled. . On ACEI. Continue current medications.    3. Osteoporosis, unspecified osteoporosis type, unspecified pathological fracture presence Intolerant to alendronate, Continue vitamin D supplementation.   4. Hyperlipidemia, unspecified hyperlipidemia type She is tolerating lovastatin well with no adverse effects.    5. Upper respiratory tract infection, unspecified type Counseled regarding signs and symptoms of viral and bacterial respiratory infections. Advised to call or return for additional evaluation if she develops any sign of bacterial infection, or if current symptoms last longer than 10 days.    6. Microalbuminuria Continue ACE and appropriate risk factor modification.   Return in about 4 months (around 02/14/2017) for diabetes.     She declined flu vaccine due to illness today.   Lelon Huh, MD  [facility]

## 2016-11-04 ENCOUNTER — Other Ambulatory Visit: Payer: Self-pay | Admitting: Family Medicine

## 2016-11-18 ENCOUNTER — Other Ambulatory Visit: Payer: Self-pay | Admitting: Family Medicine

## 2017-02-17 NOTE — Progress Notes (Signed)
Patient: Natalie Bray Female    DOB: 07-23-1934   83 y.o.   MRN: [id number] Visit Date: 02/18/2017  Today's Provider: Lelon Huh, MD   Chief Complaint  Patient presents with  . Hypertension  . Diabetes  . Hyperlipidemia   Subjective:    HPI   Diabetes Mellitus Type II, Follow-up:   Lab Results  Component Value Date   HGBA1C 6.6 10/15/2016   HGBA1C 6.5 06/12/2016   HGBA1C 6.6 02/12/2016   Last seen for diabetes 4 months ago.  Management since then includes none. She reports good compliance with treatment. She is not having side effects.  Home blood sugar records: 106 this am and round that in the mornings.   Episodes of hypoglycemia? Occasionally light headed.    Current Insulin Regimen: n/a Most Recent Eye Exam: Dr. Ellin Mayhew in [place], went the end of last year.  Weight trend: stable Prior visit with dietician: no Current exercise: none  ------------------------------------------------------------------------   Hypertension, follow-up:  BP Readings from Last 3 Encounters:  02/18/17 140/62  10/15/16 126/78  06/12/16 130/60    She was last seen for hypertension 4 months ago.  BP at that visit was 126/78. Management since that visit includes none.She reports good compliance with treatment. She is not having side effects.  She is not exercising. She is adherent to low salt diet.   Outside blood pressures are 140-150/60-70's. Patient denies chest pain, chest pressure/discomfort, claudication, dyspnea, exertional chest pressure/discomfort, fatigue, irregular heart beat, lower extremity edema, near-syncope, orthopnea, palpitations, paroxysmal nocturnal dyspnea, syncope and tachypnea.   Cardiovascular risk factors include advanced age (older than 32 for men, 32 for women), diabetes mellitus, dyslipidemia and hypertension.   ------------------------------------------------------------------------    Lipid/Cholesterol, Follow-up:   Last seen for  this 4 months ago.  Management since that visit includes none.  Last Lipid Panel:    Component Value Date/Time   CHOL 198 02/12/2016 0942   TRIG 244 (H) 02/12/2016 0942   HDL 54 02/12/2016 0942   CHOLHDL 3.7 02/12/2016 0942   [place] 95 02/12/2016 0942    She reports good compliance with treatment. She is not having side effects.   Wt Readings from Last 3 Encounters:  02/18/17 157 lb (71.2 kg)  10/15/16 162 lb (73.5 kg)  06/12/16 166 lb (75.3 kg)    ------------------------------------------------------------------------  Follow up osteoporosis Was prescribed alendronate last year but she quit due to itching. Is did start taking OTC vitamin D and calcium  Allergies  Allergen Reactions  . Celecoxib Rash    Flushed red     Current Outpatient Medications:  .  ACCU-CHEK AVIVA PLUS test strip, TEST BLOOD SUGAR EVERY DAY, Disp: 100 each, Rfl: 4 .  Acetaminophen (TYLENOL ARTHRITIS PAIN PO), Take by mouth daily as needed., Disp: , Rfl:  .  amLODipine (NORVASC) 10 MG tablet, TAKE 1 TABLET EVERY DAY, Disp: 90 tablet, Rfl: 4 .  aspirin 81 MG chewable tablet, Chew 81 mg by mouth daily., Disp: , Rfl:  .  calcium-vitamin D (CALCIUM 500/D) 500-200 MG-UNIT per tablet, Take 1 tablet by mouth daily., Disp: , Rfl:  .  Cholecalciferol (VITAMIN D3) 1000 units CAPS, Take by mouth., Disp: , Rfl:  .  docusate sodium (COLACE) 100 MG capsule, Take 100 mg by mouth daily., Disp: , Rfl:  .  glipiZIDE (GLUCOTROL) 10 MG tablet, Take 0.5 tablets (5 mg total) by mouth daily before breakfast., Disp: 3 tablet, Rfl: 0 .  lisinopril-hydrochlorothiazide (PRINZIDE,ZESTORETIC) 20-12.5 MG  tablet, TAKE 2 TABLETS EVERY DAY, Disp: 180 tablet, Rfl: 4 .  lovastatin (MEVACOR) 40 MG tablet, TAKE 1 TABLET EVERY DAY, Disp: 90 tablet, Rfl: 4 .  metFORMIN (GLUCOPHAGE) 1000 MG tablet, TAKE 1 TABLET TWICE DAILY, Disp: 180 tablet, Rfl: 4 .  Nutritional Supplements (VITAMIN D MAINTENANCE) THERAPY PACK MISC, Take by mouth.,  Disp: , Rfl:  .  Omega-3 1000 MG CAPS, Take 1 capsule by mouth daily. , Disp: , Rfl:  .  omeprazole (PRILOSEC) 20 MG capsule, TAKE 1 CAPSULE EVERY DAY, Disp: 90 capsule, Rfl: 3 .  vitamin E (E-400) 400 UNIT capsule, Take 1 capsule by mouth daily., Disp: , Rfl:   Review of Systems  Constitutional: Negative for appetite change, chills, fatigue and fever.  Respiratory: Negative for chest tightness and shortness of breath.   Cardiovascular: Negative for chest pain and palpitations.  Gastrointestinal: Negative for abdominal pain, nausea and vomiting.  Neurological: Negative for dizziness and weakness.    Social History   Tobacco Use  . Smoking status: Never Smoker  . Smokeless tobacco: Never Used  Substance Use Topics  . Alcohol use: No    Alcohol/week: 0.0 oz   Objective:   BP 140/62 (BP Location: Right Arm, Patient Position: Sitting, Cuff Size: Normal)   Pulse 68   Temp 97.9 F (36.6 C) (Oral)   Resp 16   Wt 157 lb (71.2 kg)   SpO2 97%   BMI 26.13 kg/m  Vitals:   02/18/17 0817  BP: 140/62  Pulse: 68  Resp: 16  Temp: 97.9 F (36.6 C)  TempSrc: Oral  SpO2: 97%  Weight: 157 lb (71.2 kg)     Physical Exam   General Appearance:    Alert, cooperative, no distress  Eyes:    PERRL, conjunctiva/corneas clear, EOM's intact       Lungs:     Clear to auscultation bilaterally, respirations unlabored  Heart:    Regular rate and rhythm, II/VI SM RUSB  Neurologic:   Awake, alert, oriented x 3. No apparent focal neurological           defect.         Results for orders placed or performed in visit on 02/18/17  POCT HgB A1C  Result Value Ref Range   Hemoglobin A1C 6.1        Assessment & Plan:     1. Type 2 diabetes mellitus with stage 1 chronic kidney disease, without long-term current use of insulin (HCC) Well controlled.  Reduce glipizide due to occasionally hypoglycemia - POCT HgB A1C - glipiZIDE (GLUCOTROL) 10 MG tablet; Take 0.5 tablets (5 mg total) by mouth  daily before breakfast.  Dispense: 3 tablet; Refill: 0  2. Essential hypertension Well controlled.  Continue current medications.    3. Hyperlipidemia, unspecified hyperlipidemia type She is tolerating lovastatin well with no adverse effects.   - Lipid panel - Comprehensive metabolic panel  4. Osteoporosis, unspecified osteoporosis type, unspecified pathological fracture presence Intolerant to alendronate, but has started vitamin D. Anticipate repeat BMD 2020.  - VITAMIN D 25 Hydroxy (Vit-D Deficiency, Fractures)       Lelon Huh, MD  [facility]

## 2017-02-18 ENCOUNTER — Ambulatory Visit (INDEPENDENT_AMBULATORY_CARE_PROVIDER_SITE_OTHER): Payer: Medicare HMO | Admitting: Family Medicine

## 2017-02-18 ENCOUNTER — Encounter: Payer: Self-pay | Admitting: Family Medicine

## 2017-02-18 VITALS — BP 140/62 | HR 68 | Temp 97.9°F | Resp 16 | Wt 157.0 lb

## 2017-02-18 DIAGNOSIS — E1122 Type 2 diabetes mellitus with diabetic chronic kidney disease: Secondary | ICD-10-CM

## 2017-02-18 DIAGNOSIS — M810 Age-related osteoporosis without current pathological fracture: Secondary | ICD-10-CM

## 2017-02-18 DIAGNOSIS — N181 Chronic kidney disease, stage 1: Secondary | ICD-10-CM

## 2017-02-18 DIAGNOSIS — E785 Hyperlipidemia, unspecified: Secondary | ICD-10-CM | POA: Diagnosis not present

## 2017-02-18 DIAGNOSIS — I10 Essential (primary) hypertension: Secondary | ICD-10-CM | POA: Diagnosis not present

## 2017-02-18 LAB — POCT GLYCOSYLATED HEMOGLOBIN (HGB A1C): HEMOGLOBIN A1C: 6.1

## 2017-02-18 MED ORDER — GLIPIZIDE 10 MG PO TABS: 5.0000 mg | ORAL_TABLET | Freq: Every day | ORAL | 0 refills | Status: DC

## 2017-02-18 NOTE — Patient Instructions (Signed)
   Start cutting glipizide 10mg  tablets in half to make 5mg  a day

## 2017-02-19 ENCOUNTER — Telehealth: Payer: Self-pay | Admitting: *Deleted

## 2017-02-19 LAB — COMPREHENSIVE METABOLIC PANEL
ALBUMIN: 4.4 g/dL (ref 3.5–4.7)
ALK PHOS: 60 IU/L (ref 39–117)
ALT: 15 IU/L (ref 0–32)
AST: 13 IU/L (ref 0–40)
Albumin/Globulin Ratio: 1.8 (ref 1.2–2.2)
BUN/Creatinine Ratio: 22 (ref 12–28)
BUN: 14 mg/dL (ref 8–27)
Bilirubin Total: 0.2 mg/dL (ref 0.0–1.2)
CHLORIDE: 99 mmol/L (ref 96–106)
CO2: 26 mmol/L (ref 20–29)
CREATININE: 0.63 mg/dL (ref 0.57–1.00)
Calcium: 10.3 mg/dL (ref 8.7–10.3)
GFR calc Af Amer: 98 mL/min/{1.73_m2} (ref >59)
GFR calc non Af Amer: 85 mL/min/{1.73_m2} (ref >59)
GLUCOSE: 103 mg/dL — AB (ref 65–99)
Globulin, Total: 2.5 g/dL (ref 1.5–4.5)
Potassium: 3.5 mmol/L (ref 3.5–5.2)
Sodium: 141 mmol/L (ref 134–144)
Total Protein: 6.9 g/dL (ref 6.0–8.5)

## 2017-02-19 LAB — LIPID PANEL
CHOL/HDL RATIO: 3 ratio (ref 0.0–4.4)
Cholesterol, Total: 177 mg/dL (ref 100–199)
HDL: 59 mg/dL (ref >39)
LDL Calculated: 76 mg/dL (ref 0–99)
Triglycerides: 210 mg/dL — ABNORMAL HIGH (ref 0–149)
VLDL CHOLESTEROL CAL: 42 mg/dL — AB (ref 5–40)

## 2017-02-19 LAB — VITAMIN D 25 HYDROXY (VIT D DEFICIENCY, FRACTURES): VIT D 25 HYDROXY: 47.3 ng/mL (ref 30.0–100.0)

## 2017-02-19 NOTE — Telephone Encounter (Signed)
LMOVM for pt to return call 

## 2017-02-19 NOTE — Telephone Encounter (Signed)
-----   Message from Birdie Sons, MD sent at 02/19/2017  7:48 AM EST ----- Blood sugar, kidney functions, electrolytes are all normal. Cholesterol stable at 177. Continue current medications.  Follow up in May as scheduled.

## 2017-02-21 NOTE — Telephone Encounter (Signed)
Pt informed and voiced understanding of results. 

## 2017-03-10 DIAGNOSIS — L821 Other seborrheic keratosis: Secondary | ICD-10-CM | POA: Diagnosis not present

## 2017-03-10 DIAGNOSIS — Z85828 Personal history of other malignant neoplasm of skin: Secondary | ICD-10-CM | POA: Diagnosis not present

## 2017-03-10 DIAGNOSIS — C44329 Squamous cell carcinoma of skin of other parts of face: Secondary | ICD-10-CM | POA: Diagnosis not present

## 2017-03-10 DIAGNOSIS — D485 Neoplasm of uncertain behavior of skin: Secondary | ICD-10-CM | POA: Diagnosis not present

## 2017-03-10 DIAGNOSIS — L570 Actinic keratosis: Secondary | ICD-10-CM | POA: Diagnosis not present

## 2017-03-10 DIAGNOSIS — C4441 Basal cell carcinoma of skin of scalp and neck: Secondary | ICD-10-CM | POA: Diagnosis not present

## 2017-03-10 DIAGNOSIS — X32XXXA Exposure to sunlight, initial encounter: Secondary | ICD-10-CM | POA: Diagnosis not present

## 2017-03-10 DIAGNOSIS — Z08 Encounter for follow-up examination after completed treatment for malignant neoplasm: Secondary | ICD-10-CM | POA: Diagnosis not present

## 2017-04-07 ENCOUNTER — Other Ambulatory Visit: Payer: Self-pay | Admitting: Family Medicine

## 2017-04-07 DIAGNOSIS — N181 Chronic kidney disease, stage 1: Principal | ICD-10-CM

## 2017-04-07 DIAGNOSIS — E1122 Type 2 diabetes mellitus with diabetic chronic kidney disease: Secondary | ICD-10-CM

## 2017-04-23 ENCOUNTER — Other Ambulatory Visit: Payer: Self-pay

## 2017-04-23 ENCOUNTER — Telehealth: Payer: Self-pay | Admitting: Family Medicine

## 2017-04-23 ENCOUNTER — Encounter: Payer: Self-pay | Admitting: Emergency Medicine

## 2017-04-23 ENCOUNTER — Emergency Department
Admission: EM | Admit: 2017-04-23 | Discharge: 2017-04-23 | Disposition: A | Discharge status: Home / Self Care | Payer: Commercial Managed Care - HMO | Attending: Emergency Medicine | Admitting: Emergency Medicine

## 2017-04-23 DIAGNOSIS — N189 Chronic kidney disease, unspecified: Secondary | ICD-10-CM | POA: Diagnosis not present

## 2017-04-23 DIAGNOSIS — R000 Tachycardia, unspecified: Secondary | ICD-10-CM | POA: Diagnosis not present

## 2017-04-23 DIAGNOSIS — Z85828 Personal history of other malignant neoplasm of skin: Secondary | ICD-10-CM | POA: Diagnosis not present

## 2017-04-23 DIAGNOSIS — Z79899 Other long term (current) drug therapy: Secondary | ICD-10-CM | POA: Insufficient documentation

## 2017-04-23 DIAGNOSIS — E1122 Type 2 diabetes mellitus with diabetic chronic kidney disease: Secondary | ICD-10-CM | POA: Diagnosis not present

## 2017-04-23 DIAGNOSIS — Z7984 Long term (current) use of oral hypoglycemic drugs: Secondary | ICD-10-CM | POA: Insufficient documentation

## 2017-04-23 DIAGNOSIS — Z7982 Long term (current) use of aspirin: Secondary | ICD-10-CM | POA: Insufficient documentation

## 2017-04-23 DIAGNOSIS — Z96653 Presence of artificial knee joint, bilateral: Secondary | ICD-10-CM | POA: Insufficient documentation

## 2017-04-23 DIAGNOSIS — I10 Essential (primary) hypertension: Secondary | ICD-10-CM | POA: Diagnosis not present

## 2017-04-23 DIAGNOSIS — I129 Hypertensive chronic kidney disease with stage 1 through stage 4 chronic kidney disease, or unspecified chronic kidney disease: Secondary | ICD-10-CM | POA: Diagnosis not present

## 2017-04-23 LAB — BASIC METABOLIC PANEL
ANION GAP: 9 (ref 5–15)
BUN: 22 mg/dL — ABNORMAL HIGH (ref 6–20)
CALCIUM: 10.7 mg/dL — AB (ref 8.9–10.3)
CO2: 29 mmol/L (ref 22–32)
CREATININE: 0.63 mg/dL (ref 0.44–1.00)
Chloride: 103 mmol/L (ref 101–111)
GFR calc Af Amer: >60 mL/min (ref >60)
GLUCOSE: 134 mg/dL — AB (ref 65–99)
Potassium: 3.3 mmol/L — ABNORMAL LOW (ref 3.5–5.1)
Sodium: 141 mmol/L (ref 135–145)

## 2017-04-23 LAB — CBC
HCT: 40.4 % (ref 35.0–47.0)
Hemoglobin: 13.1 g/dL (ref 12.0–16.0)
MCH: 28.4 pg (ref 26.0–34.0)
MCHC: 32.4 g/dL (ref 32.0–36.0)
MCV: 87.5 fL (ref 80.0–100.0)
Platelets: 402 10*3/uL (ref 150–440)
RBC: 4.62 MIL/uL (ref 3.80–5.20)
RDW: 13.9 % (ref 11.5–14.5)
WBC: 6.9 10*3/uL (ref 3.6–11.0)

## 2017-04-23 LAB — TROPONIN I: Troponin I: <0.03 ng/mL (ref <0.03)

## 2017-04-23 MED ORDER — METOPROLOL SUCCINATE ER 25 MG PO TB24: 12.5000 mg | ORAL_TABLET | Freq: Every day | ORAL | 0 refills | Status: DC

## 2017-04-23 MED ORDER — SODIUM CHLORIDE 0.9 % IV BOLUS
500.0000 mL | Freq: Once | INTRAVENOUS | Status: AC
  Administered 2017-04-23: 500 mL via INTRAVENOUS

## 2017-04-23 NOTE — Telephone Encounter (Signed)
Please review. Patient was just discharged from the ED.

## 2017-04-23 NOTE — ED Notes (Signed)
Pt ambulatory to toilet in tx room without difficulty.

## 2017-04-23 NOTE — ED Notes (Signed)
NAD noted at time of D/C. Pt denies questions or concerns. Pt taken to the lobby via wheelchair at this time by this RN.

## 2017-04-23 NOTE — ED Notes (Signed)
This RN to bedside to introduce self to patient and family. Pt is resting comfortably at this time. Pt's family remains at bedside at this time. Pt denies any needs at this time. This RN reviewed proper call bell use, pt states understanding. Will continue to monitor for further patient needs.

## 2017-04-23 NOTE — Telephone Encounter (Signed)
She needs to start metoprolol ER 25mg  1/2 tablet daily, #15 and schedule follow up o.v. Next week.

## 2017-04-23 NOTE — ED Provider Notes (Signed)
[facility] Emergency Department Provider Note   ____________________________________________   First MD Initiated Contact with Patient 04/23/17 0315     (approximate)  I have reviewed the triage vital signs and the nursing notes.   HISTORY  Chief Complaint Hypertension    HPI Natalie Bray is a 83 y.o. female who comes into the hospital today because her blood pressure went up.  She reports that she woke up this morning and she felt as though her heart was racing.  When she checked her heart rate was 105/109.  She reports that she took the lovastatin which she states sometimes helps decrease her heart rate.  She also took an amlodipine this afternoon.  The patient states that her blood pressure has been elevated all day with the highest being in the 180s.  The patient states the amlodipine helped bring her pressure down but then it went right back up.  She reports that this also started today.  Her blood pressure is normally in the 150s over 60s.  The patient denies any headache or chest pain.  She reports that her sugar had dropped earlier and then she noticed her blood pressure was up when she checked it.  The patient is here today for evaluation.   Past Medical History:  Diagnosis Date  . Anemia   . Anemia, iron deficiency 08/08/2014  . Diabetes mellitus without complication ([place])   . History of chicken pox   . History of skin cancer   . Hypertension     Patient Active Problem List   Diagnosis Date Noted  . Microalbuminuria 10/15/2016  . Osteoporosis 03/14/2016  . Degenerative disc disease, lumbar 08/08/2014  . Hyponatremia 08/08/2014  . Hypokalemia 08/08/2014  . Varicose vein 08/08/2014  . Allergic rhinitis 08/08/2014  . GERD (gastroesophageal reflux disease) 08/08/2014  . Osteoarthritis 08/08/2014  . Type 2 diabetes mellitus with diabetic chronic kidney disease ([place]) 03/08/2013  . Hypertension 03/08/2013  . Hyperlipidemia 03/08/2013  .  Post menopausal syndrome 03/08/2013  . Enterocele 01/28/2013  . Vaginal vault prolapse after hysterectomy 01/28/2013    Past Surgical History:  Procedure Laterality Date  . Prolapsed bladder  03/2013   Dr. Zigmund Daniel  . REPLACEMENT TOTAL KNEE Left 01/2011   [facility]  . REPLACEMENT TOTAL KNEE Right 03/2010  . TOTAL ABDOMINAL HYSTERECTOMY W/ BILATERAL SALPINGOOPHORECTOMY  1974   BSO    Prior to Admission medications   Medication Sig Start Date End Date Taking? Authorizing Provider  Acetaminophen (TYLENOL ARTHRITIS PAIN PO) Take by mouth daily as needed.   Yes [provider]  amLODipine (NORVASC) 10 MG tablet TAKE 1 TABLET EVERY DAY 11/04/16  Yes Birdie Sons, MD  aspirin 81 MG chewable tablet Chew 81 mg by mouth daily.   Yes [provider]  calcium-vitamin D (CALCIUM 500/D) 500-200 MG-UNIT per tablet Take 1 tablet by mouth daily.   Yes [provider]  Cholecalciferol (VITAMIN D3) 1000 units CAPS Take by mouth.   Yes [provider]  docusate sodium (COLACE) 100 MG capsule Take 100 mg by mouth daily. 03/25/13  Yes [provider]  glipiZIDE (GLUCOTROL) 10 MG tablet TAKE 1 TABLET EVERY DAY BEFORE BREAKFAST 04/08/17  Yes Birdie Sons, MD  lisinopril-hydrochlorothiazide (PRINZIDE,ZESTORETIC) 20-12.5 MG tablet TAKE 2 TABLETS EVERY DAY Patient taking differently: TAKE 1 TABLETS EVERY DAY 11/04/16  Yes Birdie Sons, MD  lovastatin (MEVACOR) 40 MG tablet TAKE 1 TABLET EVERY DAY 11/04/16  Yes Birdie Sons, MD  metFORMIN (GLUCOPHAGE) 1000 MG tablet TAKE 1 TABLET TWICE DAILY 11/18/16  Yes Birdie Sons, MD  Omega-3 1000 MG CAPS Take 1 capsule by mouth daily.    Yes [provider]  omeprazole (PRILOSEC) 20 MG capsule TAKE 1 CAPSULE EVERY DAY 08/30/16  Yes Birdie Sons, MD  vitamin E (E-400) 400 UNIT capsule Take 1 capsule by mouth daily.   Yes [provider]  ACCU-CHEK AVIVA PLUS test strip TEST BLOOD SUGAR EVERY DAY  04/08/17   Birdie Sons, MD  Nutritional Supplements (VITAMIN D MAINTENANCE) THERAPY PACK MISC Take by mouth.    [provider]    Allergies Celecoxib  Family History  Problem Relation Age of Onset  . Kidney failure Mother   . Aneurysm Father   . Hypertension Sister   . Diabetes Sister   . Diabetes Brother   . Hypertension Brother   . Diabetes Sister   . Hypertension Sister   . Diabetes Brother   . Hypertension Brother     Social History Social History   Tobacco Use  . Smoking status: Never Smoker  . Smokeless tobacco: Never Used  Substance Use Topics  . Alcohol use: No    Alcohol/week: 0.0 oz  . Drug use: No    Review of Systems  Constitutional: No fever/chills Eyes: No visual changes. ENT: No sore throat. Cardiovascular: Denies chest pain. Respiratory: Denies shortness of breath. Gastrointestinal: No abdominal pain.   Genitourinary: Negative for dysuria. Musculoskeletal: Negative for back pain. Skin: Negative for rash. Neurological: Negative for headaches,   ____________________________________________   PHYSICAL EXAM:  VITAL SIGNS: ED Triage Vitals  Enc Vitals Group     BP 04/23/17 0255 (!) 182/91     Pulse Rate 04/23/17 0255 (!) 112     Resp 04/23/17 0255 18     Temp 04/23/17 0255 98 F (36.7 C)     Temp Source 04/23/17 0255 Oral     SpO2 04/23/17 0255 98 %     Weight 04/23/17 0254 160 lb (72.6 kg)     Height 04/23/17 0254 5\' 4"  (1.626 m)     Head Circumference --      Peak Flow --      Pain Score --      Pain Loc --      Pain Edu? --      Excl. in [place]? --     Constitutional: Alert and oriented. Well appearing and in mild distress. Eyes: Conjunctivae are normal. PERRL. EOMI. Head: Atraumatic. Nose: No congestion/rhinnorhea. Mouth/Throat: Mucous membranes are moist.  Oropharynx non-erythematous. Cardiovascular: Tachycardia, regular rhythm. Grossly normal heart sounds.  Good peripheral circulation. Respiratory: Normal  respiratory effort.  No retractions. Lungs CTAB. Gastrointestinal: Soft and nontender. No distention.  Positive bowel sounds Musculoskeletal: No lower extremity tenderness nor edema.   Neurologic:  Normal speech and language.  Skin:  Skin is warm, dry and intact.  Psychiatric: Mood and affect are normal.   ____________________________________________   LABS (all labs ordered are listed, but only abnormal results are displayed)  Labs Reviewed  BASIC METABOLIC PANEL - Abnormal; Notable for the following components:      Result Value   Potassium 3.3 (*)    Glucose, Bld 134 (*)    BUN 22 (*)    Calcium 10.7 (*)    All other components within normal limits  CBC  TROPONIN I  TROPONIN I   ____________________________________________  EKG  ED ECG REPORT I, Loney Hering, the  attending physician, personally viewed and interpreted this ECG.   Date: 04/23/2017  EKG Time: 300  Rate: 113  Rhythm: sinus tachycardia  Axis: normal  Intervals:none  ST&T Change: Flipped T waves in leads I and aVL  ____________________________________________  RADIOLOGY  ED MD interpretation:  none  Official radiology report(s): No results found.  ____________________________________________   PROCEDURES  Procedure(s) performed: None  Procedures  Critical Care performed: No  ____________________________________________   INITIAL IMPRESSION / ASSESSMENT AND PLAN / ED COURSE  As part of my medical decision making, I reviewed the following data within the electronic MEDICAL RECORD NUMBER Notes from prior ED visits and  Controlled Substance Database   This is an 83 year old female who comes into the hospital today with hypertension.  She reports that she did not know what to do as her blood pressure was very high.  The patient is not having any chest pain or headache.  Given the patient's age my differential includes CVA, ACS, Hypertensive urgency, Hypertensive Emergency.  I did  check some blood work on the patient to include a CBC and a BMP.  The patient also had a troponin all of which were negative.  I did give the patient some normal saline as she was slightly tachycardic.  The patient's heart rate improved and her blood pressure also improved without any intervention.  The patient's EKG does not show any ST segment elevation MI.  I will repeat the patient's troponin.  I feel that this may be benign hypertension she does not have any headache or chest pain which would indicate emergency versus urgency.  I will await the results of the troponin and the patient will then be dispositioned.      The patient's care was signed out to Dr. Jimmye Norman will follow up the results of the repeat troponin and disposition the patient.  ____________________________________________   FINAL CLINICAL IMPRESSION(S) / ED DIAGNOSES  Final diagnoses:  Hypertension, unspecified type     ED Discharge Orders    None       Note:  This document was prepared using Dragon voice recognition software and may include unintentional dictation errors.    Loney Hering, MD 04/23/17 [phone]

## 2017-04-23 NOTE — ED Triage Notes (Addendum)
Patient ambulatory to triage with steady gait, without difficulty, face flushed; st BP 188/81 and HR 105 at home, took extra ds of amlodipine with only brief relief; denies any accomp symptoms

## 2017-04-23 NOTE — ED Provider Notes (Signed)
Repeat troponin is negative, she is cleared for outpatient follow-up.   Earleen Newport, MD 04/23/17 [phone]

## 2017-04-23 NOTE — ED Notes (Signed)
Pt continues to rest in bed with NAD noted. Pt's husband remains at bedside. Apologized for and explained delay. Will continue to monitor for further patient needs.

## 2017-04-23 NOTE — Telephone Encounter (Signed)
Patient advised. Medication was sent into the pharmacy. Appt was scheduled.

## 2017-04-23 NOTE — Telephone Encounter (Signed)
Pt states she was seen in the ED this morning due to feeling dizzy and was told that it may be her medication causing this to happen and to check with her provider to see if her medication needs to be changed, so pt states she has appointment coming up in May, pt would like to know if she should wait until May or does she need to come in sooner. Please advise pt on what to do. Pt states its ok to leave message on phone if she doesn't get to phone in time. Thanks CC

## 2017-04-23 NOTE — ED Notes (Addendum)
Patient is sitting upright comfortably in the stretcher at this time with no signs of distress present. Family remains at bedside. VS stable. Denies needs at this time. Will continue to monitor.

## 2017-04-24 NOTE — Telephone Encounter (Signed)
complete

## 2017-04-28 ENCOUNTER — Ambulatory Visit (INDEPENDENT_AMBULATORY_CARE_PROVIDER_SITE_OTHER): Payer: Medicare HMO | Admitting: Family Medicine

## 2017-04-28 ENCOUNTER — Encounter: Payer: Self-pay | Admitting: Family Medicine

## 2017-04-28 VITALS — BP 142/60 | HR 68 | Temp 97.8°F | Resp 16 | Wt 155.0 lb

## 2017-04-28 DIAGNOSIS — I10 Essential (primary) hypertension: Secondary | ICD-10-CM | POA: Diagnosis not present

## 2017-04-28 MED ORDER — CLONIDINE HCL 0.1 MG PO TABS: ORAL_TABLET | ORAL | 1 refills | Status: DC

## 2017-04-28 NOTE — Progress Notes (Signed)
Patient: Natalie Bray Female    DOB: Sep 06, 1934   83 y.o.   MRN: [id number] Visit Date: 04/28/2017  Today's Provider: Lelon Huh, MD   Chief Complaint  Patient presents with  . Follow-up  . Hypertension   Subjective:    HPI   Follow up ER visit  Patient was seen in ER for HTN on 04/23/2017. She was treated for Hypertension. Treatment for this included; labs checked, EKG-negative. Since ER visit patient was started on metoprolol ER 25 mg 1/2 tablet qd by Dr. Caryn Section on 04/23/2017. She reports good compliance with treatment. She reports this condition is slightly improved. Patient states her blood pressure has still been elevated since adding the Metoprolol. Home blood pressure readings have been 156/69  ------------------------------------------------------------------------------------      Allergies  Allergen Reactions  . Celecoxib Rash    Flushed red     Current Outpatient Medications:  .  ACCU-CHEK AVIVA PLUS test strip, TEST BLOOD SUGAR EVERY DAY, Disp: 100 each, Rfl: 4 .  Acetaminophen (TYLENOL ARTHRITIS PAIN PO), Take by mouth daily as needed., Disp: , Rfl:  .  amLODipine (NORVASC) 10 MG tablet, TAKE 1 TABLET EVERY DAY, Disp: 90 tablet, Rfl: 4 .  aspirin 81 MG chewable tablet, Chew 81 mg by mouth daily., Disp: , Rfl:  .  calcium-vitamin D (CALCIUM 500/D) 500-200 MG-UNIT per tablet, Take 1 tablet by mouth daily., Disp: , Rfl:  .  Cholecalciferol (VITAMIN D3) 1000 units CAPS, Take by mouth., Disp: , Rfl:  .  docusate sodium (COLACE) 100 MG capsule, Take 100 mg by mouth daily., Disp: , Rfl:  .  glipiZIDE (GLUCOTROL) 10 MG tablet, TAKE 1 TABLET EVERY DAY BEFORE BREAKFAST, Disp: 90 tablet, Rfl: 4 .  lisinopril-hydrochlorothiazide (PRINZIDE,ZESTORETIC) 20-12.5 MG tablet, TAKE 2 TABLETS EVERY DAY (Patient taking differently: TAKE 1 TABLETS EVERY DAY), Disp: 180 tablet, Rfl: 4 .  lovastatin (MEVACOR) 40 MG tablet, TAKE 1 TABLET EVERY DAY, Disp: 90 tablet, Rfl:  4 .  metFORMIN (GLUCOPHAGE) 1000 MG tablet, TAKE 1 TABLET TWICE DAILY, Disp: 180 tablet, Rfl: 4 .  metoprolol succinate (TOPROL-XL) 25 MG 24 hr tablet, Take 0.5 tablets (12.5 mg total) by mouth daily., Disp: 15 tablet, Rfl: 0 .  Nutritional Supplements (VITAMIN D MAINTENANCE) THERAPY PACK MISC, Take by mouth., Disp: , Rfl:  .  Omega-3 1000 MG CAPS, Take 1 capsule by mouth daily. , Disp: , Rfl:  .  omeprazole (PRILOSEC) 20 MG capsule, TAKE 1 CAPSULE EVERY DAY, Disp: 90 capsule, Rfl: 3 .  vitamin E (E-400) 400 UNIT capsule, Take 1 capsule by mouth daily., Disp: , Rfl:   Review of Systems  Constitutional: Negative for appetite change, chills, fatigue and fever.  Respiratory: Negative for chest tightness and shortness of breath.   Cardiovascular: Negative for chest pain and palpitations.  Gastrointestinal: Negative for abdominal pain, nausea and vomiting.  Neurological: Negative for dizziness and weakness.    Social History   Tobacco Use  . Smoking status: Never Smoker  . Smokeless tobacco: Never Used  Substance Use Topics  . Alcohol use: No    Alcohol/week: 0.0 oz   Objective:   BP (!) 142/60 (BP Location: Right Arm, Patient Position: Sitting, Cuff Size: Large)   Pulse 68   Temp 97.8 F (36.6 C) (Oral)   Resp 16   Wt 155 lb (70.3 kg)   SpO2 95% Comment: room air  BMI 26.61 kg/m  There were no vitals filed for this visit.  Physical Exam   General Appearance:    Alert, cooperative, no distress  Eyes:    PERRL, conjunctiva/corneas clear, EOM's intact       Lungs:     Clear to auscultation bilaterally, respirations unlabored  Heart:    Regular rate and rhythm  Neurologic:   Awake, alert, oriented x 3. No apparent focal neurological           defect.          Assessment & Plan:     1. Essential hypertension HR normalized on metoprolol.Continue current medications.  Can take clonidine only if she see extremely high BP.  - cloNIDine (CATAPRES) 0.1 MG tablet; Take one  tablet as needed for blood pressure over 180. May repeat in 30 minutes. Do not take more than 3 in a day  Dispense: 30 tablet; Refill: 1  Return in 3 weeks to check BP and A1c.       Lelon Huh, MD  [facility]

## 2017-05-01 ENCOUNTER — Other Ambulatory Visit: Payer: Self-pay | Admitting: Family Medicine

## 2017-05-12 DIAGNOSIS — C44329 Squamous cell carcinoma of skin of other parts of face: Secondary | ICD-10-CM | POA: Diagnosis not present

## 2017-05-19 DIAGNOSIS — L570 Actinic keratosis: Secondary | ICD-10-CM | POA: Diagnosis not present

## 2017-05-19 DIAGNOSIS — C4441 Basal cell carcinoma of skin of scalp and neck: Secondary | ICD-10-CM | POA: Diagnosis not present

## 2017-05-23 ENCOUNTER — Ambulatory Visit (INDEPENDENT_AMBULATORY_CARE_PROVIDER_SITE_OTHER): Payer: Medicare HMO | Admitting: Family Medicine

## 2017-05-23 ENCOUNTER — Telehealth: Payer: Self-pay | Admitting: Family Medicine

## 2017-05-23 ENCOUNTER — Encounter: Payer: Self-pay | Admitting: Family Medicine

## 2017-05-23 VITALS — BP 124/60 | HR 60 | Temp 97.6°F | Resp 16 | Ht 65.0 in | Wt 155.0 lb

## 2017-05-23 DIAGNOSIS — I10 Essential (primary) hypertension: Secondary | ICD-10-CM

## 2017-05-23 DIAGNOSIS — N181 Chronic kidney disease, stage 1: Secondary | ICD-10-CM | POA: Diagnosis not present

## 2017-05-23 DIAGNOSIS — E1122 Type 2 diabetes mellitus with diabetic chronic kidney disease: Secondary | ICD-10-CM

## 2017-05-23 LAB — POCT GLYCOSYLATED HEMOGLOBIN (HGB A1C)
Est. average glucose Bld gHb Est-mCnc: 128
HEMOGLOBIN A1C: 6.1

## 2017-05-23 MED ORDER — METOPROLOL SUCCINATE ER 25 MG PO TB24: ORAL_TABLET | ORAL | 2 refills | Status: DC

## 2017-05-23 MED ORDER — METOPROLOL SUCCINATE ER 25 MG PO TB24: ORAL_TABLET | ORAL | 0 refills | Status: DC

## 2017-05-23 NOTE — Progress Notes (Signed)
Patient: Natalie Bray Female    DOB: 01/19/1935   83 y.o.   MRN: [id number] Visit Date: 05/23/2017  Today's Provider: Lelon Huh, MD   Chief Complaint  Patient presents with  . Diabetes    follow up  . Hypertension    follow up   Subjective:    HPI   Diabetes Mellitus Type II, Follow-up:   Lab Results  Component Value Date   HGBA1C 6.1 02/18/2017   HGBA1C 6.6 10/15/2016   HGBA1C 6.5 06/12/2016   Last seen for diabetes 4 months ago.  Management since then includes; reduced glipizide due to occasional hypoglycemia. She reports good compliance with treatment. She is not having side effects.  Current symptoms include none and have been stable. Home blood sugar records: fasting range: 120 or less  Episodes of hypoglycemia? no   Current Insulin Regimen: none Most Recent Eye Exam: < 1 year ago Weight trend: stable Prior visit with dietician: no Current diet: well balanced Current exercise: none  ------------------------------------------------------------------------   Hypertension, follow-up:  BP Readings from Last 3 Encounters:  04/28/17 (!) 142/60  04/23/17 (!) 143/81  02/18/17 140/62    She was last seen for hypertension 4 weeks ago.  BP at that visit was 142/60. Management since that visit includes; no changes. She was started on 12.5mg  metoprolol the week before that visit due to elevated blood pressure of 182/91 and dizziness at ER visit 04/23/17 .She reports good compliance with treatment. Today she states she thinks her BP was only elevated due to stress from work at [employer] and has since quit working there.  She is not having side effects.  She is not exercising. She is adherent to low salt diet.   Outside blood pressures are 130-160/60. She is experiencing none.  Patient denies chest pain, chest pressure/discomfort, claudication, dyspnea, exertional chest pressure/discomfort, fatigue, irregular heart beat, lower extremity edema,  near-syncope, orthopnea, palpitations, paroxysmal nocturnal dyspnea, syncope and tachypnea.   Cardiovascular risk factors include advanced age (older than 45 for men, 68 for women), diabetes mellitus and hypertension.  Use of agents associated with hypertension: NSAIDS.   ------------------------------------------------------------------------    Allergies  Allergen Reactions  . Celecoxib Rash    Flushed red     Current Outpatient Medications:  .  ACCU-CHEK AVIVA PLUS test strip, TEST BLOOD SUGAR EVERY DAY, Disp: 100 each, Rfl: 4 .  Acetaminophen (TYLENOL ARTHRITIS PAIN PO), Take by mouth daily as needed., Disp: , Rfl:  .  amLODipine (NORVASC) 10 MG tablet, TAKE 1 TABLET EVERY DAY, Disp: 90 tablet, Rfl: 4 .  aspirin 81 MG chewable tablet, Chew 81 mg by mouth daily., Disp: , Rfl:  .  calcium-vitamin D (CALCIUM 500/D) 500-200 MG-UNIT per tablet, Take 1 tablet by mouth daily., Disp: , Rfl:  .  Cholecalciferol (VITAMIN D3) 1000 units CAPS, Take by mouth., Disp: , Rfl:  .  cloNIDine (CATAPRES) 0.1 MG tablet, Take one tablet as needed for blood pressure over 180. May repeat in 30 minutes. Do not take more than 3 in a day, Disp: 30 tablet, Rfl: 1 .  docusate sodium (COLACE) 100 MG capsule, Take 100 mg by mouth daily., Disp: , Rfl:  .  glipiZIDE (GLUCOTROL) 10 MG tablet, TAKE 1 TABLET EVERY DAY BEFORE BREAKFAST, Disp: 90 tablet, Rfl: 4 .  lisinopril-hydrochlorothiazide (PRINZIDE,ZESTORETIC) 20-12.5 MG tablet, TAKE 2 TABLETS EVERY DAY (Patient taking differently: TAKE 1 TABLETS EVERY DAY), Disp: 180 tablet, Rfl: 4 .  lovastatin (MEVACOR) 40  MG tablet, TAKE 1 TABLET EVERY DAY, Disp: 90 tablet, Rfl: 4 .  metFORMIN (GLUCOPHAGE) 1000 MG tablet, TAKE 1 TABLET TWICE DAILY, Disp: 180 tablet, Rfl: 4 .  metoprolol succinate (TOPROL-XL) 25 MG 24 hr tablet, TAKE 1/2 (ONE-HALF) TABLET BY MOUTH ONCE DAILY, Disp: 15 tablet, Rfl: 5 .  Nutritional Supplements (VITAMIN D MAINTENANCE) THERAPY PACK MISC, Take by  mouth., Disp: , Rfl:  .  Omega-3 1000 MG CAPS, Take 1 capsule by mouth daily. , Disp: , Rfl:  .  omeprazole (PRILOSEC) 20 MG capsule, TAKE 1 CAPSULE EVERY DAY, Disp: 90 capsule, Rfl: 3 .  vitamin E (E-400) 400 UNIT capsule, Take 1 capsule by mouth daily., Disp: , Rfl:   Review of Systems  Constitutional: Negative for appetite change, chills, fatigue and fever.  Respiratory: Negative for chest tightness and shortness of breath.   Cardiovascular: Negative for chest pain and palpitations.  Gastrointestinal: Negative for abdominal pain, nausea and vomiting.  Neurological: Negative for dizziness and weakness.    Social History   Tobacco Use  . Smoking status: Never Smoker  . Smokeless tobacco: Never Used  Substance Use Topics  . Alcohol use: No    Alcohol/week: 0.0 oz   Objective:   BP 124/60 (BP Location: Left Arm, Patient Position: Sitting, Cuff Size: Normal)   Pulse 60   Temp 97.6 F (36.4 C) (Oral)   Resp 16   Ht 5\' 5"  (1.651 m)   Wt 155 lb (70.3 kg)   SpO2 97% Comment: room air  BMI 25.79 kg/m     Physical Exam   General Appearance:    Alert, cooperative, no distress  Eyes:    PERRL, conjunctiva/corneas clear, EOM's intact       Lungs:     Clear to auscultation bilaterally, respirations unlabored  Heart:    Regular rate and rhythm  Neurologic:   Awake, alert, oriented x 3. No apparent focal neurological           defect.       Results for orders placed or performed in visit on 05/23/17  POCT HgB A1C  Result Value Ref Range   Hemoglobin A1C 6.1    Est. average glucose Bld gHb Est-mCnc 128        Assessment & Plan:     1. Type 2 diabetes mellitus with stage 1 chronic kidney disease, without long-term current use of insulin (HCC) Well controlled.  Continue current medications.   - POCT HgB A1C  2. Essential hypertension Well controlled.  Is tolerating addition of metoprolol a month ago. Continue same dose for now. She feels her BP elevation was entirely due  to stress from working at [employer]. Will consider taking off metoprolol if her BP remains well controlled between now and follow up in 4 months.        Lelon Huh, MD  [facility]

## 2017-05-23 NOTE — Telephone Encounter (Signed)
Pt called and is scheduled for AWV on 08/28/17. Thanks TNP

## 2017-05-23 NOTE — Telephone Encounter (Signed)
No answer, left message for patient to return call.

## 2017-06-17 ENCOUNTER — Ambulatory Visit: Payer: Self-pay | Admitting: Family Medicine

## 2017-08-13 ENCOUNTER — Other Ambulatory Visit: Payer: Self-pay | Admitting: Family Medicine

## 2017-08-28 ENCOUNTER — Ambulatory Visit (INDEPENDENT_AMBULATORY_CARE_PROVIDER_SITE_OTHER): Payer: Medicare HMO

## 2017-08-28 VITALS — BP 136/58 | HR 74 | Temp 98.4°F | Ht 65.0 in | Wt 157.4 lb

## 2017-08-28 DIAGNOSIS — Z0000 Encounter for general adult medical examination without abnormal findings: Secondary | ICD-10-CM | POA: Diagnosis not present

## 2017-08-28 NOTE — Patient Instructions (Signed)
Natalie Bray , Thank you for taking time to come for your Medicare Wellness Visit. I appreciate your ongoing commitment to your health goals. Please review the following plan we discussed and let me know if I can assist you in the future.   Screening recommendations/referrals: Colonoscopy: Up to date Mammogram: Up to date Bone Density: Up to date Recommended yearly ophthalmology/optometry visit for glaucoma screening and checkup Recommended yearly dental visit for hygiene and checkup  Vaccinations: Influenza vaccine: N/A Pneumococcal vaccine: Up to date Tdap vaccine: Pt declines today.  Shingles vaccine: Pt declines today.     Advanced directives: Advance directive discussed with you today. Even though you declined this today please call our office should you change your mind and we can give you the proper paperwork for you to fill out.  Conditions/risks identified: None  Next appointment: 09/23/17 @ 8 AM with Dr Caryn Section. Pt declined scheduling the AWV for 2020.    Preventive Care 65 Years and Older, Female Preventive care refers to lifestyle choices and visits with your health care provider that can promote health and wellness. What does preventive care include?  A yearly physical exam. This is also called an annual well check.  Dental exams once or twice a year.  Routine eye exams. Ask your health care provider how often you should have your eyes checked.  Personal lifestyle choices, including:  Daily care of your teeth and gums.  Regular physical activity.  Eating a healthy diet.  Avoiding tobacco and drug use.  Limiting alcohol use.  Practicing safe sex.  Taking low-dose aspirin every day.  Taking vitamin and mineral supplements as recommended by your health care provider. What happens during an annual well check? The services and screenings done by your health care provider during your annual well check will depend on your age, overall health, lifestyle risk  factors, and family history of disease. Counseling  Your health care provider may ask you questions about your:  Alcohol use.  Tobacco use.  Drug use.  Emotional well-being.  Home and relationship well-being.  Sexual activity.  Eating habits.  History of falls.  Memory and ability to understand (cognition).  Work and work Statistician.  Reproductive health. Screening  You may have the following tests or measurements:  Height, weight, and BMI.  Blood pressure.  Lipid and cholesterol levels. These may be checked every 5 years, or more frequently if you are over 93 years old.  Skin check.  Lung cancer screening. You may have this screening every year starting at age 81 if you have a 30-pack-year history of smoking and currently smoke or have quit within the past 15 years.  Fecal occult blood test (FOBT) of the stool. You may have this test every year starting at age 38.  Flexible sigmoidoscopy or colonoscopy. You may have a sigmoidoscopy every 5 years or a colonoscopy every 10 years starting at age 30.  Hepatitis C blood test.  Hepatitis B blood test.  Sexually transmitted disease (STD) testing.  Diabetes screening. This is done by checking your blood sugar (glucose) after you have not eaten for a while (fasting). You may have this done every 1-3 years.  Bone density scan. This is done to screen for osteoporosis. You may have this done starting at age 21.  Mammogram. This may be done every 1-2 years. Talk to your health care provider about how often you should have regular mammograms. Talk with your health care provider about your test results, treatment options, and if  necessary, the need for more tests. Vaccines  Your health care provider may recommend certain vaccines, such as:  Influenza vaccine. This is recommended every year.  Tetanus, diphtheria, and acellular pertussis (Tdap, Td) vaccine. You may need a Td booster every 10 years.  Zoster vaccine. You  may need this after age 77.  Pneumococcal 13-valent conjugate (PCV13) vaccine. One dose is recommended after age 6.  Pneumococcal polysaccharide (PPSV23) vaccine. One dose is recommended after age 58. Talk to your health care provider about which screenings and vaccines you need and how often you need them. This information is not intended to replace advice given to you by your health care provider. Make sure you discuss any questions you have with your health care provider. Document Released: 02/03/2015 Document Revised: 09/27/2015 Document Reviewed: 11/08/2014 Elsevier Interactive Patient Education  2017 [place] Prevention in the Home Falls can cause injuries. They can happen to people of all ages. There are many things you can do to make your home safe and to help prevent falls. What can I do on the outside of my home?  Regularly fix the edges of walkways and driveways and fix any cracks.  Remove anything that might make you trip as you walk through a door, such as a raised step or threshold.  Trim any bushes or trees on the path to your home.  Use bright outdoor lighting.  Clear any walking paths of anything that might make someone trip, such as rocks or tools.  Regularly check to see if handrails are loose or broken. Make sure that both sides of any steps have handrails.  Any raised decks and porches should have guardrails on the edges.  Have any leaves, snow, or ice cleared regularly.  Use sand or salt on walking paths during winter.  Clean up any spills in your garage right away. This includes oil or grease spills. What can I do in the bathroom?  Use night lights.  Install grab bars by the toilet and in the tub and shower. Do not use towel bars as grab bars.  Use non-skid mats or decals in the tub or shower.  If you need to sit down in the shower, use a plastic, non-slip stool.  Keep the floor dry. Clean up any water that spills on the floor as soon as  it happens.  Remove soap buildup in the tub or shower regularly.  Attach bath mats securely with double-sided non-slip rug tape.  Do not have throw rugs and other things on the floor that can make you trip. What can I do in the bedroom?  Use night lights.  Make sure that you have a light by your bed that is easy to reach.  Do not use any sheets or blankets that are too big for your bed. They should not hang down onto the floor.  Have a firm chair that has side arms. You can use this for support while you get dressed.  Do not have throw rugs and other things on the floor that can make you trip. What can I do in the kitchen?  Clean up any spills right away.  Avoid walking on wet floors.  Keep items that you use a lot in easy-to-reach places.  If you need to reach something above you, use a strong step stool that has a grab bar.  Keep electrical cords out of the way.  Do not use floor polish or wax that makes floors slippery. If you must  use wax, use non-skid floor wax.  Do not have throw rugs and other things on the floor that can make you trip. What can I do with my stairs?  Do not leave any items on the stairs.  Make sure that there are handrails on both sides of the stairs and use them. Fix handrails that are broken or loose. Make sure that handrails are as long as the stairways.  Check any carpeting to make sure that it is firmly attached to the stairs. Fix any carpet that is loose or worn.  Avoid having throw rugs at the top or bottom of the stairs. If you do have throw rugs, attach them to the floor with carpet tape.  Make sure that you have a light switch at the top of the stairs and the bottom of the stairs. If you do not have them, ask someone to add them for you. What else can I do to help prevent falls?  Wear shoes that:  Do not have high heels.  Have rubber bottoms.  Are comfortable and fit you well.  Are closed at the toe. Do not wear sandals.  If  you use a stepladder:  Make sure that it is fully opened. Do not climb a closed stepladder.  Make sure that both sides of the stepladder are locked into place.  Ask someone to hold it for you, if possible.  Clearly mark and make sure that you can see:  Any grab bars or handrails.  First and last steps.  Where the edge of each step is.  Use tools that help you move around (mobility aids) if they are needed. These include:  Canes.  Walkers.  Scooters.  Crutches.  Turn on the lights when you go into a dark area. Replace any light bulbs as soon as they burn out.  Set up your furniture so you have a clear path. Avoid moving your furniture around.  If any of your floors are uneven, fix them.  If there are any pets around you, be aware of where they are.  Review your medicines with your doctor. Some medicines can make you feel dizzy. This can increase your chance of falling. Ask your doctor what other things that you can do to help prevent falls. This information is not intended to replace advice given to you by your health care provider. Make sure you discuss any questions you have with your health care provider. Document Released: 11/03/2008 Document Revised: 06/15/2015 Document Reviewed: 02/11/2014 Elsevier Interactive Patient Education  2017 [employer].

## 2017-08-28 NOTE — Progress Notes (Signed)
Subjective:   Natalie Bray is a 83 y.o. female who presents for Medicare Annual (Subsequent) preventive examination.  Review of Systems:  N/A  Cardiac Risk Factors include: advanced age (>90men, >57 women);diabetes mellitus;dyslipidemia;hypertension     Objective:     Vitals: BP (!) 136/58 (BP Location: Right Arm)   Pulse 74   Temp 98.4 F (36.9 C) (Oral)   Ht 5\' 5"  (1.651 m)   Wt 157 lb 6.4 oz (71.4 kg)   BMI 26.19 kg/m   Body mass index is 26.19 kg/m.  Advanced Directives 08/28/2017 02/19/2015  Does Patient Have a Medical Advance Directive? No No  Does patient want to make changes to medical advance directive? No - Patient declined -  Would patient like information on creating a medical advance directive? - No - patient declined information    Tobacco Social History   Tobacco Use  Smoking Status Never Smoker  Smokeless Tobacco Never Used     Counseling given: Not Answered   Clinical Intake:  Pre-visit preparation completed: Yes  Pain : No/denies pain Pain Score: 0-No pain     Nutritional Status: BMI 25 -29 Overweight Nutritional Risks: None Diabetes: Yes(type 2) CBG done?: No Did pt. bring in CBG monitor from home?: No  How often do you need to have someone help you when you read instructions, pamphlets, or other written materials from your doctor or pharmacy?: 1 - Never  Interpreter Needed?: No  Information entered by :: [facility], LPN  Past Medical History:  Diagnosis Date  . Anemia   . Anemia, iron deficiency 08/08/2014  . Diabetes mellitus without complication ([place])   . History of chicken pox   . History of skin cancer   . Hyperlipidemia   . Hypertension    Past Surgical History:  Procedure Laterality Date  . Prolapsed bladder  03/2013   Dr. Zigmund Daniel  . REPLACEMENT TOTAL KNEE Left 01/2011   [facility]  . REPLACEMENT TOTAL KNEE Right 03/2010  . TOTAL ABDOMINAL HYSTERECTOMY W/ BILATERAL SALPINGOOPHORECTOMY  1974   BSO   Family History    Problem Relation Age of Onset  . Kidney failure Mother   . Aneurysm Father   . Hypertension Sister   . Diabetes Sister   . Diabetes Brother   . Hypertension Brother   . Diabetes Sister   . Hypertension Sister   . Diabetes Brother   . Hypertension Brother    Social History   Socioeconomic History  . Marital status: Married    Spouse name: Not on file  . Number of children: 2  . Years of education: 26  . Highest education level: 12th grade  Occupational History  . Occupation: retired    Fish farm manager: [employer]    Comment: people greeter  Social Needs  . Financial resource strain: Not hard at all  . Food insecurity:    Worry: Never true    Inability: Never true  . Transportation needs:    Medical: No    Non-medical: No  Tobacco Use  . Smoking status: Never Smoker  . Smokeless tobacco: Never Used  Substance and Sexual Activity  . Alcohol use: No    Alcohol/week: 0.0 standard drinks  . Drug use: No  . Sexual activity: Not on file  Lifestyle  . Physical activity:    Days per week: Not on file    Minutes per session: Not on file  . Stress: Not at all  Relationships  . Social connections:    Talks on  phone: Not on file    Gets together: Not on file    Attends religious service: Not on file    Active member of club or organization: Not on file    Attends meetings of clubs or organizations: Not on file    Relationship status: Not on file  Other Topics Concern  . Not on file  Social History Narrative  . Not on file    Outpatient Encounter Medications as of 08/28/2017  Medication Sig  . ACCU-CHEK AVIVA PLUS test strip TEST BLOOD SUGAR EVERY DAY  . Acetaminophen (TYLENOL ARTHRITIS PAIN PO) Take by mouth daily as needed.  Marland Kitchen amLODipine (NORVASC) 10 MG tablet TAKE 1 TABLET EVERY DAY  . aspirin 81 MG chewable tablet Chew 81 mg by mouth daily.  . calcium-vitamin D (CALCIUM 500/D) 500-200 MG-UNIT per tablet Take 1 tablet by mouth daily.  . Cholecalciferol (VITAMIN D3) 1000  units CAPS Take by mouth.  . cloNIDine (CATAPRES) 0.1 MG tablet Take one tablet as needed for blood pressure over 180. May repeat in 30 minutes. Do not take more than 3 in a day  . docusate sodium (COLACE) 100 MG capsule Take 100 mg by mouth daily.  Marland Kitchen glipiZIDE (GLUCOTROL) 10 MG tablet TAKE 1 TABLET EVERY DAY BEFORE BREAKFAST  . lisinopril-hydrochlorothiazide (PRINZIDE,ZESTORETIC) 20-12.5 MG tablet TAKE 2 TABLETS EVERY DAY (Patient taking differently: TAKE 1 TABLETS EVERY DAY)  . lovastatin (MEVACOR) 40 MG tablet TAKE 1 TABLET EVERY DAY  . metFORMIN (GLUCOPHAGE) 1000 MG tablet TAKE 1 TABLET TWICE DAILY  . Nutritional Supplements (VITAMIN D MAINTENANCE) THERAPY PACK MISC Take by mouth.  . Omega-3 1000 MG CAPS Take 1 capsule by mouth daily.   Marland Kitchen omeprazole (PRILOSEC) 20 MG capsule TAKE 1 CAPSULE EVERY DAY  . vitamin E (E-400) 400 UNIT capsule Take 1 capsule by mouth daily.  . metoprolol succinate (TOPROL-XL) 25 MG 24 hr tablet TAKE 1/2 (ONE-HALF) TABLET BY MOUTH ONCE DAILY (Patient not taking: Reported on 08/28/2017)   No facility-administered encounter medications on file as of 08/28/2017.     Activities of Daily Living In your present state of health, do you have any difficulty performing the following activities: 08/28/2017  Hearing? N  Vision? N  Difficulty concentrating or making decisions? N  Walking or climbing stairs? N  Dressing or bathing? N  Doing errands, shopping? N  Preparing Food and eating ? N  Using the Toilet? N  In the past six months, have you accidently leaked urine? N  Do you have problems with loss of bowel control? N  Managing your Medications? N  Managing your Finances? N  Housekeeping or managing your Housekeeping? N  Some recent data might be hidden    Patient Care Team: Natalie Sons, MD as PCP - General (Family Medicine) Natalie Bray, Natalie Record, MD as Consulting Physician (Orthopedic Surgery) Natalie Bray, Natalie Slim, MD as Consulting Physician (Obstetrics and  Gynecology) Natalie Bray, Natalie Char, MD (Dermatology) Natalie Bray, Natalie Bray (Optometry)    Assessment:   This is a routine wellness examination for Natalie Bray.  Exercise Activities and Dietary recommendations Current Exercise Habits: The patient does not participate in regular exercise at present, Exercise limited by: None identified  Goals   None     Fall Risk Fall Risk  08/28/2017 06/12/2016 02/12/2016 01/10/2015  Falls in the past year? No No No No   Is the patient's home free of loose throw rugs in walkways, pet beds, electrical cords, etc?   yes  Grab bars in the bathroom? yes      Handrails on the stairs?   no      Adequate lighting?   yes  Timed Get Up and Go performed: N/A  Depression Screen PHQ 2/9 Scores 08/28/2017 06/12/2016 01/10/2015  PHQ - 2 Score 0 0 0  PHQ- 9 Score - 0 -     Cognitive Function: Pt declined screening today.         Immunization History  Administered Date(s) Administered  . Influenza, High Dose Seasonal PF 10/06/2014, 10/05/2015, 10/26/2016  . Pneumococcal Conjugate-13 04/28/2013  . Pneumococcal Polysaccharide-23 10/26/2011    Qualifies for Shingles Vaccine? Due for Shingles vaccine. Declined my offer to administer today. Education has been provided regarding the importance of this vaccine. Pt has been advised to call her insurance company to determine her out of pocket expense. Advised she may also receive this vaccine at her local pharmacy or Health Dept. Verbalized acceptance and understanding.  Screening Tests Health Maintenance  Topic Date Due  . TETANUS/TDAP  10/03/1953  . FOOT EXAM  01/10/2016  . OPHTHALMOLOGY EXAM  08/10/2016  . INFLUENZA VACCINE  08/21/2017  . HEMOGLOBIN A1C  11/23/2017  . DEXA SCAN  03/14/2018  . PNA vac Low Risk Adult  Completed    Cancer Screenings: Lung: Low Dose CT Chest recommended if Age 45-80 years, 30 pack-year currently smoking OR have quit w/in 15years. Patient does not qualify. Breast:  Up to date on  Mammogram? Yes   Up to date of Bone Density/Dexa? Yes Colorectal: Up to date  Additional Screenings:  Hepatitis C Screening: N/A     Plan:  I have personally reviewed and addressed the Medicare Annual Wellness questionnaire and have noted the following in the patient's chart:  A. Medical and social history B. Use of alcohol, tobacco or illicit drugs  C. Current medications and supplements D. Functional ability and status E.  Nutritional status F.  Physical activity G. Advance directives H. List of other physicians I.  Hospitalizations, surgeries, and ER visits in previous 12 months J.  [facility] such as hearing and vision if needed, cognitive and depression L. Referrals and appointments - none  In addition, I have reviewed and discussed with patient certain preventive protocols, quality metrics, and best practice recommendations. A written personalized care plan for preventive services as well as general preventive health recommendations were provided to patient.  See attached scanned questionnaire for additional information.   Signed,  Fabio Neighbors, LPN Nurse Health Advisor   Nurse Recommendations: Pt needs a diabetic foot exam at next OV. Pt plans to have an eye exam this fall. Requested records to be faxed once completed. Pt declined the tetanus vaccine today.

## 2017-09-16 ENCOUNTER — Ambulatory Visit: Payer: Medicare HMO | Admitting: Obstetrics and Gynecology

## 2017-09-16 ENCOUNTER — Encounter: Payer: Self-pay | Admitting: Obstetrics and Gynecology

## 2017-09-16 VITALS — BP 167/76 | HR 88 | Ht 65.0 in | Wt 158.6 lb

## 2017-09-16 DIAGNOSIS — R531 Weakness: Secondary | ICD-10-CM

## 2017-09-16 DIAGNOSIS — K4090 Unilateral inguinal hernia, without obstruction or gangrene, not specified as recurrent: Secondary | ICD-10-CM

## 2017-09-16 DIAGNOSIS — R202 Paresthesia of skin: Secondary | ICD-10-CM | POA: Diagnosis not present

## 2017-09-16 DIAGNOSIS — M5136 Other intervertebral disc degeneration, lumbar region: Secondary | ICD-10-CM

## 2017-09-16 NOTE — Progress Notes (Signed)
GYN ENCOUNTER NOTE  Subjective:       Natalie Bray is a 83 y.o. G62P2002 female is here for gynecologic evaluation of the following issues:  1.  Lower abdominal mass.    History of TAH/BSO years ago. Long history of subsequent pelvic organ prolapse. Status post anterior/posterior colporrhaphy with enterocele ligation in 2013. Status post repair of recurrent enterocele at [facility] in 2015. Patient presents with 75-month history of a bulge in her right lower abdomen adjacent to her mons pubis.  She reports no fever chills or sweats.  She reports no nausea vomiting or diarrhea.  She reports no change in bowel function.  Bladder function is normal.  She denies vaginal discharge.  Significant associated symptoms include occasional sudden onset of lower abdominal pain with development of bilateral leg numbness and tingling with loss of strength.  There are no exacerbating or alleviating factors.  She has history of bilateral knee replacements by Dr. Marry Guan.  She does have history of arthritis  Gynecologic History No LMP recorded. Patient has had a hysterectomy.  Obstetric History OB History  Gravida Para Term Preterm AB Living  2 2 2     2   SAB TAB Ectopic Multiple Live Births          2    # Outcome Date GA Lbr Len/2nd Weight Sex Delivery Anes PTL Lv  2 Term 1968    F Vag-Spont   LIV  1 Term 53    F Vag-Spont   LIV    Past Medical History:  Diagnosis Date  . Anemia   . Anemia, iron deficiency 08/08/2014  . Diabetes mellitus without complication ([place])   . History of chicken pox   . History of skin cancer   . Hyperlipidemia   . Hypertension     Past Surgical History:  Procedure Laterality Date  . ABDOMINAL HYSTERECTOMY    . Prolapsed bladder  03/2013   Dr. Zigmund Daniel  . REPLACEMENT TOTAL KNEE Left 01/2011   [facility]  . REPLACEMENT TOTAL KNEE Right 03/2010  . TOTAL ABDOMINAL HYSTERECTOMY W/ BILATERAL SALPINGOOPHORECTOMY  1974   BSO    Current Outpatient Medications on  File Prior to Visit  Medication Sig Dispense Refill  . ACCU-CHEK AVIVA PLUS test strip TEST BLOOD SUGAR EVERY DAY 100 each 4  . Acetaminophen (TYLENOL ARTHRITIS PAIN PO) Take by mouth daily as needed.    Marland Kitchen amLODipine (NORVASC) 10 MG tablet TAKE 1 TABLET EVERY DAY 90 tablet 4  . aspirin 81 MG chewable tablet Chew 81 mg by mouth daily.    . calcium-vitamin D (CALCIUM 500/D) 500-200 MG-UNIT per tablet Take 1 tablet by mouth daily.    . Cholecalciferol (VITAMIN D3) 1000 units CAPS Take by mouth.    . cloNIDine (CATAPRES) 0.1 MG tablet Take one tablet as needed for blood pressure over 180. May repeat in 30 minutes. Do not take more than 3 in a day 30 tablet 1  . docusate sodium (COLACE) 100 MG capsule Take 100 mg by mouth daily.    Marland Kitchen glipiZIDE (GLUCOTROL) 10 MG tablet TAKE 1 TABLET EVERY DAY BEFORE BREAKFAST 90 tablet 4  . lisinopril-hydrochlorothiazide (PRINZIDE,ZESTORETIC) 20-12.5 MG tablet TAKE 2 TABLETS EVERY DAY (Patient taking differently: TAKE 1 TABLETS EVERY DAY) 180 tablet 4  . lovastatin (MEVACOR) 40 MG tablet TAKE 1 TABLET EVERY DAY 90 tablet 4  . metFORMIN (GLUCOPHAGE) 1000 MG tablet TAKE 1 TABLET TWICE DAILY 180 tablet 4  . Nutritional Supplements (VITAMIN D MAINTENANCE)  THERAPY PACK MISC Take by mouth.    . Omega-3 1000 MG CAPS Take 1 capsule by mouth daily.     Marland Kitchen omeprazole (PRILOSEC) 20 MG capsule TAKE 1 CAPSULE EVERY DAY 90 capsule 4  . vitamin E (E-400) 400 UNIT capsule Take 1 capsule by mouth daily.     No current facility-administered medications on file prior to visit.     Allergies  Allergen Reactions  . Celecoxib Rash and Other (See Comments)    Flushed red flushing Flushed red     Social History   Socioeconomic History  . Marital status: Married    Spouse name: Not on file  . Number of children: 2  . Years of education: 53  . Highest education level: 12th grade  Occupational History  . 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 Financial resource strain: Not hard at all  . Food insecurity:    Worry: Never true    Inability: Never true  . Transportation needs:    Medical: No    Non-medical: No  Tobacco Use  . Smoking status: Never Smoker  . Smokeless tobacco: Never Used  Substance and Sexual Activity  . Alcohol use: No    Alcohol/week: 0.0 standard drinks  . Drug use: No  . Sexual activity: Not Currently  Lifestyle  . Physical activity:    Days per week: Not on file    Minutes per session: Not on file  . Stress: Not at all  Relationships  . Social connections:    Talks on phone: Not on file    Gets together: Not on file    Attends religious service: Not on file    Active member of club or organization: Not on file    Attends meetings of clubs or organizations: Not on file    Relationship status: Not on file  . Intimate partner violence:    Fear of current or ex partner: Not on file    Emotionally abused: Not on file    Physically abused: Not on file    Forced sexual activity: Not on file  Other Topics Concern  . Not on file  Social History Narrative  . Not on file    Family History  Problem Relation Age of Onset  . Kidney failure Mother   . Aneurysm Father   . Hypertension Sister   . Diabetes Sister   . Diabetes Brother   . Hypertension Brother   . Diabetes Sister   . Hypertension Sister   . Diabetes Brother   . Hypertension Brother     The following portions of the patient's history were reviewed and updated as appropriate: allergies, current medications, past family history, past medical history, past social history, past surgical history and problem list.  Review of Systems Review of Systems -see HPI regarding GI and GU symptomatology as well as musculoskeletal symptomatology  Objective:   BP (!) 167/76   Pulse 88   Ht 5\' 5"  (1.651 m)   Wt 158 lb 9.6 oz (71.9 kg)   BMI 26.39 kg/m  CONSTITUTIONAL: Well-developed, well-nourished female in no acute  distress.  HENT:  Normocephalic, atraumatic.  NECK: Normal range of motion, supple, no masses.  Normal thyroid.  SKIN: Skin is warm and dry. No rash noted. Not diaphoretic. No erythema. No pallor. [place]: Alert and oriented to person, place, and time.  PSYCHIATRIC: Normal mood and affect. Normal behavior. Normal judgment and thought  content. CARDIOVASCULAR:Not Examined RESPIRATORY: Not Examined BREASTS: Not Examined ABDOMEN: Soft, non distended; Non tender.  No Organomegaly.  In the standing position, there is a notable right lower quadrant abdominal wall defect with a bulge pushing out over the right mons pubis region consistent with hernia.  The bulge is made worse with cough.  Left lower abdomen is normal PELVIC:  External Genitalia: Normal  BUS: Normal  Vagina: Normal vault support without significant prolapse; mild to moderate atrophy consistent with age  Cervix: Surgically absent  Uterus: Surgically absent  Adnexa: Surgically absent  Bimanual: No palpable pelvic masses  RV: Normal external exam  Bladder: Nontender MUSCULOSKELETAL: Normal range of motion. No tenderness.  No cyanosis, clubbing, or edema.     Assessment:   1. Inguinal hernia of right side without obstruction or gangrene - Ambulatory referral to General Surgery   2.  History of recurrent pelvic organ prolapse, status post surgical repair, successful  3.  Degenerative disc disease, lumbar spine, with symptoms of leg paresthesias and weakness, intermittent  Plan:   1.  Referral to general surgery for further evaluation and management 2.  Consider orthopedic referral for further investigation of musculoskeletal symptoms of lower extremities.  Patient has seen Dr. Marry Guan in the past for knee replacements. 3.  Return as needed for any pelvic gynecologic complaints.  Brayton Mars, MD  Note: This dictation was prepared with Dragon dictation along with smaller phrase technology. Any transcriptional  errors that result from this process are unintentional.

## 2017-09-16 NOTE — Patient Instructions (Addendum)
1.  Referral to general surgery for evaluation of inguinal hernia-Dr. Jeannette Corpus 2.  Consider follow-up with orthopedics for further evaluation of leg paresthesias with subjective weakness, possibly related to lumbar disc disease.   Inguinal Hernia, Adult An inguinal hernia is when fat or the intestines push through the area where the leg meets the lower abdomen (groin) and create a rounded lump (bulge). This condition develops over time. There are three types of inguinal hernias. These types include:  Hernias that can be pushed back into the belly (are reducible).  Hernias that are not reducible (are incarcerated).  Hernias that are not reducible and lose their blood supply (are strangulated). This type of hernia requires emergency surgery.  What are the causes? This condition is caused by having a weak spot in the muscles or tissue. This weakness lets the hernia poke through. This condition can be triggered by:  Suddenly straining the muscles of the lower abdomen.  Lifting heavy objects.  Straining to have a bowel movement. Difficult bowel movements (constipation) can lead to this.  Coughing.  What increases the risk? This condition is more likely to develop in:  Men.  Pregnant women.  People who: ? Are overweight. ? Work in jobs that require long periods of standing or heavy lifting. ? Have had an inguinal hernia before. ? Smoke or have lung disease. These factors can lead to long-lasting (chronic) coughing.  What are the signs or symptoms? Symptoms can depend on the size of the hernia. Often, a small inguinal hernia has no symptoms. Symptoms of a larger hernia include:  A lump in the groin. This is easier to see when the person is standing. It might not be visible when he or she is lying down.  Pain or burning in the groin. This occurs especially when lifting, straining, or coughing.  A dull ache or a feeling of pressure in the groin.  A lump in the scrotum in  men.  Symptoms of a strangulated inguinal hernia can include:  A bulge in the groin that is very painful and tender to the touch.  A bulge that turns red or purple.  Fever, nausea, and vomiting.  The inability to have a bowel movement or to pass gas.  How is this diagnosed? This condition is diagnosed with a medical history and physical exam. Your health care provider may feel your groin area and ask you to cough. How is this treated? Treatment for this condition varies depending on the size of your hernia and whether you have symptoms. If you do not have symptoms, your health care provider may have you watch your hernia carefully and come in for follow-up visits. If your hernia is larger or if you have symptoms, your treatment will include surgery. Follow these instructions at home: Lifestyle  Drink enough fluid to keep your urine clear or pale yellow.  Eat a diet that includes a lot of fiber. Eat plenty of fruits, vegetables, and whole grains. Talk with your health care provider if you have questions.  Avoid lifting heavy objects.  Avoid standing for long periods of time.  Do not use tobacco products, including cigarettes, chewing tobacco, or e-cigarettes. If you need help quitting, ask your health care provider.  Maintain a healthy weight. General instructions  Do not try to force the hernia back in.  Watch your hernia for any changes in color or size. Let your health care provider know if any changes occur.  Take over-the-counter and prescription medicines only as  told by your health care provider.  Keep all follow-up visits as told by your health care provider. This is important. Contact a health care provider if:  You have a fever.  You have new symptoms.  Your symptoms get worse. Get help right away if:  You have pain in the groin that suddenly gets worse.  A bulge in the groin gets bigger suddenly and does not go down.  You are a man and you have a sudden  pain in the scrotum, or the size of your scrotum suddenly changes.  A bulge in the groin area becomes red or purple and is painful to the touch.  You have nausea or vomiting that does not go away.  You feel your heart beating a lot more quickly than normal.  You cannot have a bowel movement or pass gas. This information is not intended to replace advice given to you by your health care provider. Make sure you discuss any questions you have with your health care provider. Document Released: 05/26/2008 Document Revised: 06/15/2015 Document Reviewed: 11/17/2013 Elsevier Interactive Patient Education  2018 [employer].

## 2017-09-23 ENCOUNTER — Encounter: Payer: Self-pay | Admitting: Family Medicine

## 2017-09-23 ENCOUNTER — Ambulatory Visit (INDEPENDENT_AMBULATORY_CARE_PROVIDER_SITE_OTHER): Payer: Medicare HMO | Admitting: Family Medicine

## 2017-09-23 VITALS — BP 125/72 | HR 58 | Temp 97.7°F | Resp 16 | Ht 65.0 in | Wt 158.0 lb

## 2017-09-23 DIAGNOSIS — E1122 Type 2 diabetes mellitus with diabetic chronic kidney disease: Secondary | ICD-10-CM | POA: Diagnosis not present

## 2017-09-23 DIAGNOSIS — N181 Chronic kidney disease, stage 1: Secondary | ICD-10-CM

## 2017-09-23 DIAGNOSIS — I10 Essential (primary) hypertension: Secondary | ICD-10-CM

## 2017-09-23 DIAGNOSIS — Z23 Encounter for immunization: Secondary | ICD-10-CM

## 2017-09-23 LAB — POCT GLYCOSYLATED HEMOGLOBIN (HGB A1C)
ESTIMATED AVERAGE GLUCOSE: 134
Hemoglobin A1C: 6.3 % — AB (ref 4.0–5.6)

## 2017-09-23 MED ORDER — GLIPIZIDE ER 5 MG PO TB24: 5.0000 mg | ORAL_TABLET | Freq: Every day | ORAL | 3 refills | Status: DC

## 2017-09-23 NOTE — Progress Notes (Signed)
Patient: Natalie Bray Female    DOB: [birth year]   83 y.o.   MRN: [id number] Visit Date: 09/23/2017  Today's Provider: Lelon Huh, MD   Chief Complaint  Patient presents with  . Diabetes  . Hypertension   Subjective:    HPI   Diabetes Mellitus Type II, Follow-up:   Lab Results  Component Value Date   HGBA1C 6.1 05/23/2017   HGBA1C 6.1 02/18/2017   HGBA1C 6.6 10/15/2016   Last seen for diabetes 3 months ago.  Management since then includes; no changes. She reports good compliance with treatment. She states she doesn't always take her evening dose of metformin due to sugars sometimes being to low in the evenings.  She is not having side effects.  Current symptoms include none and have been stable. Home blood sugar records: trend: stable; averaging 150  Episodes of hypoglycemia? About once a week in the evening.    Current Insulin Regimen: none Most Recent Eye Exam: 01/2017 Weight trend: stable Prior visit with dietician: no Current diet: well balanced Current exercise: none  ---------------------------------------------------------------   Hypertension, follow-up:  BP Readings from Last 3 Encounters:  09/23/17 125/72  09/16/17 (!) 167/76  08/28/17 (!) 136/58    She was last seen for hypertension 3 months ago.  BP at that visit was 124/60. Management since that visit includes; no changes.She reports good compliance with treatment. She is not having side effects.  She is not exercising. She is adherent to low salt diet.   Outside blood pressures are 140-160/70. She is experiencing lower extremity edema.  Patient denies chest pain, chest pressure/discomfort, claudication, dyspnea, exertional chest pressure/discomfort, fatigue, irregular heart beat, near-syncope, orthopnea, palpitations, paroxysmal nocturnal dyspnea, syncope and tachypnea.   Cardiovascular risk factors include advanced age (older than 62 for men, 64 for women), diabetes mellitus and  hypertension.  Use of agents associated with hypertension: NSAIDS.   ---------------------------------------------------------------    Allergies  Allergen Reactions  . Celecoxib Rash and Other (See Comments)    Flushed red flushing Flushed red      Current Outpatient Medications:  .  ACCU-CHEK AVIVA PLUS test strip, TEST BLOOD SUGAR EVERY DAY, Disp: 100 each, Rfl: 4 .  Acetaminophen (TYLENOL ARTHRITIS PAIN PO), Take by mouth daily as needed., Disp: , Rfl:  .  amLODipine (NORVASC) 10 MG tablet, TAKE 1 TABLET EVERY DAY, Disp: 90 tablet, Rfl: 4 .  aspirin 81 MG chewable tablet, Chew 81 mg by mouth daily., Disp: , Rfl:  .  calcium-vitamin D (CALCIUM 500/D) 500-200 MG-UNIT per tablet, Take 1 tablet by mouth daily., Disp: , Rfl:  .  Cholecalciferol (VITAMIN D3) 1000 units CAPS, Take by mouth., Disp: , Rfl:  .  cloNIDine (CATAPRES) 0.1 MG tablet, Take one tablet as needed for blood pressure over 180. May repeat in 30 minutes. Do not take more than 3 in a day, Disp: 30 tablet, Rfl: 1 .  docusate sodium (COLACE) 100 MG capsule, Take 100 mg by mouth daily., Disp: , Rfl:  .  glipiZIDE (GLUCOTROL) 10 MG tablet, TAKE 1 TABLET EVERY DAY BEFORE BREAKFAST, Disp: 90 tablet, Rfl: 4 .  lisinopril-hydrochlorothiazide (PRINZIDE,ZESTORETIC) 20-12.5 MG tablet, TAKE 2 TABLETS EVERY DAY (Patient taking differently: TAKE 1 TABLETS EVERY DAY), Disp: 180 tablet, Rfl: 4 .  lovastatin (MEVACOR) 40 MG tablet, TAKE 1 TABLET EVERY DAY, Disp: 90 tablet, Rfl: 4 .  metFORMIN (GLUCOPHAGE) 1000 MG tablet, TAKE 1 TABLET TWICE DAILY, Disp: 180 tablet, Rfl: 4 .  Nutritional Supplements (VITAMIN D MAINTENANCE) THERAPY PACK MISC, Take by mouth., Disp: , Rfl:  .  Omega-3 1000 MG CAPS, Take 1 capsule by mouth daily. , Disp: , Rfl:  .  omeprazole (PRILOSEC) 20 MG capsule, TAKE 1 CAPSULE EVERY DAY, Disp: 90 capsule, Rfl: 4 .  vitamin E (E-400) 400 UNIT capsule, Take 1 capsule by mouth daily., Disp: , Rfl:   Review of Systems    Constitutional: Negative for appetite change, chills, fatigue and fever.  Respiratory: Negative for chest tightness and shortness of breath.   Cardiovascular: Positive for leg swelling. Negative for chest pain and palpitations.  Gastrointestinal: Negative for abdominal pain, nausea and vomiting.  Neurological: Negative for dizziness and weakness.    Social History   Tobacco Use  . Smoking status: Never Smoker  . Smokeless tobacco: Never Used  Substance Use Topics  . Alcohol use: No    Alcohol/week: 0.0 standard drinks   Objective:   BP 125/72 (BP Location: Left Arm, Patient Position: Sitting, Cuff Size: Large)   Pulse (!) 58   Temp 97.7 F (36.5 C) (Oral)   Resp 16   Ht 5\' 5"  (1.651 m)   Wt 158 lb (71.7 kg)   SpO2 97% Comment: room air  BMI 26.29 kg/m  Vitals:   09/23/17 0813  BP: 125/72  Pulse: (!) 58  Resp: 16  Temp: 97.7 F (36.5 C)  TempSrc: Oral  SpO2: 97%  Weight: 158 lb (71.7 kg)  Height: 5\' 5"  (1.651 m)     Physical Exam   General Appearance:    Alert, cooperative, no distress  Eyes:    PERRL, conjunctiva/corneas clear, EOM's intact       Lungs:     Clear to auscultation bilaterally, respirations unlabored  Heart:    Regular rate and rhythm  Neurologic:   Awake, alert, oriented x 3. No apparent focal neurological           defect.       Results for orders placed or performed in visit on 09/23/17  POCT HgB A1C  Result Value Ref Range   Hemoglobin A1C 6.3 (A) 4.0 - 5.6 %   HbA1c POC (<> result, manual entry)     HbA1c, POC (prediabetic range)     HbA1c, POC (controlled diabetic range)     Est. average glucose Bld gHb Est-mCnc 134        Assessment & Plan:     1. Type 2 diabetes mellitus with stage 1 chronic kidney disease, without long-term current use of insulin (HCC) Well controlled, but having some hypoglycemia at least once a week. Will reduce glipizide to 5mg  and advised to take evening dose of metformin more consistently.  - POCT HgB  A1C  2. Need for influenza vaccination  - Flu vaccine HIGH DOSE PF (Fluzone High dose)  3. Essential hypertension Well controlled.  Continue current medications.    Return in about 4 months (around 01/26/2018).        Lelon Huh, MD  [facility]

## 2017-09-25 ENCOUNTER — Encounter: Payer: Self-pay | Admitting: Surgery

## 2017-09-25 ENCOUNTER — Ambulatory Visit (INDEPENDENT_AMBULATORY_CARE_PROVIDER_SITE_OTHER): Payer: Medicare HMO | Admitting: Surgery

## 2017-09-25 VITALS — BP 138/74 | HR 72 | Temp 97.7°F | Ht 64.0 in | Wt 159.4 lb

## 2017-09-25 DIAGNOSIS — K4090 Unilateral inguinal hernia, without obstruction or gangrene, not specified as recurrent: Secondary | ICD-10-CM

## 2017-09-25 NOTE — Progress Notes (Signed)
Surgical Clinic History and Physical  Referring provider:  Birdie Sons, MD [address] [place], [place] [address]  HISTORY OF PRESENT ILLNESS (HPI):  83 y.o. female presents for evaluation of her Right inguinal hernia. Patient reports she's intermittently noticed a Right groin bulge when she stands over the past 6 months - 1 year. She denies any pain and says she has noticed the bulge more often when she develops constipation, for which she drinks "a lot of water", takes a daily stool softener and Miralax or similar every 2 weeks as needed for constipation. When she has noticed the Right groin bulge, she says it's always disappeared when she lays down. She says her hernia doesn't particularly bother her, but she does complain of B/L lower extremity numbness and tingling, for which she's been advised to seek consultation with a spine specialist. Patient says she is not interested in surgery, but rather presents today to ask questions about her hernia, for what to be aware, and how to manage it. She denies abdominal pain or distention, N/V, fever/chills, CP, or SOB.  PAST MEDICAL HISTORY (PMH):  Past Medical History:  Diagnosis Date  . Anemia   . Anemia, iron deficiency 08/08/2014  . Diabetes mellitus without complication ([place])   . History of chicken pox   . History of skin cancer   . Hyperlipidemia   . Hypertension      PAST SURGICAL HISTORY ([place]):  Past Surgical History:  Procedure Laterality Date  . ABDOMINAL HYSTERECTOMY    . Prolapsed bladder  03/2013   Dr. Zigmund Daniel  . REPLACEMENT TOTAL KNEE Left 01/2011   [facility]  . REPLACEMENT TOTAL KNEE Right 03/2010  . TOTAL ABDOMINAL HYSTERECTOMY W/ BILATERAL SALPINGOOPHORECTOMY  1974   BSO     MEDICATIONS:  Prior to Admission medications   Medication Sig Start Date End Date Taking? Authorizing Provider  ACCU-CHEK AVIVA PLUS test strip TEST BLOOD SUGAR EVERY DAY 04/08/17  Yes Birdie Sons, MD  Acetaminophen (TYLENOL  ARTHRITIS PAIN PO) Take by mouth daily as needed.   Yes [provider]  amLODipine (NORVASC) 10 MG tablet TAKE 1 TABLET EVERY DAY 11/04/16  Yes Birdie Sons, MD  aspirin 81 MG chewable tablet Chew 81 mg by mouth daily.   Yes [provider]  calcium-vitamin D (CALCIUM 500/D) 500-200 MG-UNIT per tablet Take 1 tablet by mouth daily.   Yes [provider]  Cholecalciferol (VITAMIN D3) 1000 units CAPS Take by mouth.   Yes [provider]  cloNIDine (CATAPRES) 0.1 MG tablet Take one tablet as needed for blood pressure over 180. May repeat in 30 minutes. Do not take more than 3 in a day 04/28/17  Yes Fisher, Kirstie Peri, MD  docusate sodium (COLACE) 100 MG capsule Take 100 mg by mouth daily. 03/25/13  Yes [provider]  glipiZIDE (GLUCOTROL XL) 5 MG 24 hr tablet Take 1 tablet (5 mg total) by mouth daily with breakfast. 09/23/17  Yes Birdie Sons, MD  lisinopril-hydrochlorothiazide (PRINZIDE,ZESTORETIC) 20-12.5 MG tablet TAKE 2 TABLETS EVERY DAY Patient taking differently: TAKE 1 TABLETS EVERY DAY 11/04/16  Yes Birdie Sons, MD  lovastatin (MEVACOR) 40 MG tablet TAKE 1 TABLET EVERY DAY 11/04/16  Yes Birdie Sons, MD  metFORMIN (GLUCOPHAGE) 1000 MG tablet TAKE 1 TABLET TWICE DAILY 11/18/16  Yes Birdie Sons, MD  Nutritional Supplements (VITAMIN D MAINTENANCE) THERAPY PACK MISC Take by mouth.   Yes [provider]  Omega-3 1000 MG CAPS  Take 1 capsule by mouth daily.    Yes [provider]  omeprazole (PRILOSEC) 20 MG capsule TAKE 1 CAPSULE EVERY DAY 08/13/17  Yes Birdie Sons, MD  vitamin E (E-400) 400 UNIT capsule Take 1 capsule by mouth daily.   Yes [provider]     ALLERGIES:  Allergies  Allergen Reactions  . Celecoxib Rash and Other (See Comments)    Flushed red flushing Flushed red      SOCIAL HISTORY:  Social History   Socioeconomic History  . Marital status: Married    Spouse name: Not on file   . Number of children: 2  . Years of education: 69  . Highest education level: 12th grade  Occupational History  . Occupation: retired    Fish farm manager: [employer]    Comment: people greeter  Social Needs  . Financial resource strain: Not hard at all  . Food insecurity:    Worry: Never true    Inability: Never true  . Transportation needs:    Medical: No    Non-medical: No  Tobacco Use  . Smoking status: Never Smoker  . Smokeless tobacco: Never Used  Substance and Sexual Activity  . Alcohol use: No    Alcohol/week: 0.0 standard drinks  . Drug use: No  . Sexual activity: Not Currently  Lifestyle  . Physical activity:    Days per week: Not on file    Minutes per session: Not on file  . Stress: Not at all  Relationships  . Social connections:    Talks on phone: Not on file    Gets together: Not on file    Attends religious service: Not on file    Active member of club or organization: Not on file    Attends meetings of clubs or organizations: Not on file    Relationship status: Not on file  . Intimate partner violence:    Fear of current or ex partner: Not on file    Emotionally abused: Not on file    Physically abused: Not on file    Forced sexual activity: Not on file  Other Topics Concern  . Not on file  Social History Narrative  . Not on file    The patient currently resides (home / rehab facility / nursing home): Home The patient normally is (ambulatory / bedbound): Ambulatory  FAMILY HISTORY:  Family History  Problem Relation Age of Onset  . Kidney failure Mother   . Aneurysm Father   . Hypertension Sister   . Diabetes Sister   . Diabetes Brother   . Hypertension Brother   . Diabetes Sister   . Hypertension Sister   . Diabetes Brother   . Hypertension Brother     Otherwise negative/non-contributory.  REVIEW OF SYSTEMS:  Constitutional: denies any other weight loss, fever, chills, or sweats  Eyes: denies any other vision changes, history of eye injury   ENT: denies sore throat, hearing problems  Respiratory: denies shortness of breath, wheezing  Cardiovascular: denies chest pain, palpitations  Gastrointestinal: abdominal pain, N/V, and bowel function as per HPI Musculoskeletal: denies any other joint pains or cramps except as per HPI Skin: Denies any other rashes or skin discolorations Neurological: denies any other headache, dizziness, weakness  Psychiatric: Denies any other depression, anxiety   All other review of systems were otherwise negative   VITAL SIGNS:  BP 138/74   Pulse 72   Temp 97.7 F (36.5 C) (Temporal)   Ht 5\' 4"  (1.626 m)  Wt 159 lb 6.4 oz (72.3 kg)   BMI 27.36 kg/m   PHYSICAL EXAM:  Constitutional:  -- Normal body habitus  -- Awake, alert, and oriented x3  Eyes:  -- Pupils equally round and reactive to light  -- No scleral icterus  Ear, nose, throat:  -- No jugular venous distension -- No nasal drainage, bleeding Pulmonary:  -- No crackles  -- Equal breath sounds bilaterally -- Breathing non-labored at rest Cardiovascular:  -- S1, S2 present  -- No pericardial rubs  Gastrointestinal:  -- Small non-tender easily reducible subtle Right inguinal hernia -- Abdomen soft, nontender, non-distended, no guarding/rebound  -- No other abdominal masses appreciated, pulsatile or otherwise  Musculoskeletal and Integumentary:  -- Wounds or skin discoloration:  -- Extremities: B/L UE and LE FROM, hands and feet warm, no edema  Neurologic:  -- Motor function: Intact and symmetric -- Sensation: Intact and symmetric  Labs:  CBC:  Lab Results  Component Value Date   WBC 6.9 04/23/2017   RBC 4.62 04/23/2017   BMP:  Lab Results  Component Value Date   GLUCOSE 134 (H) 04/23/2017   GLUCOSE 148 (H) 07/23/2011   CO2 29 04/23/2017   CO2 27 07/23/2011   BUN 22 (H) 04/23/2017   BUN 14 02/18/2017   BUN 8 07/23/2011   CREATININE 0.63 04/23/2017   CREATININE 0.59 (L) 07/23/2011   CALCIUM 10.7 (H) 04/23/2017    CALCIUM 8.5 07/23/2011     Imaging studies: No new pertinent imaging studies available for review   Assessment/Plan: (ICD-10's: K40.9) 83 y.o. female with minimally symptomatic easily reducible small Right inguinal hernia, complicated by pertinent comorbidities including advanced chronological age, DM, HTN, HLD, chronic anemia, and history of skin cancer (not otherwise specified).               - discussed with patient signs and symptoms of hernia incarceration and obstruction             - strategies for manual self-reduction of patient's hernia also reviewed and discussed  - maintain hydration with high fiber heart healthy diet to reduce/minimize constipation + daily stool softener and Miralax as needed             - all risks, benefits, and alternatives to repair of Right inguinal hernia with mesh were discussed with the patient, all of her questions were answered to patient's expressed satisfaction, but patient expresses she is not interested in surgery at this time.             - return to clinic as needed, instructed to call if any questions or concerns  All of the above recommendations were discussed with the patient, and all of patient's questions were answered to her expressed satisfaction.  Thank you for the opportunity to participate in this patient's care.  -- Marilynne Drivers Rosana Hoes, MD, [place]: [facility] - Partnering for exceptional care. Office: [phone]

## 2017-09-25 NOTE — Patient Instructions (Signed)
Constipation, Adult Constipation is when a person:  Poops (has a bowel movement) fewer times in a week than normal.  Has a hard time pooping.  Has poop that is dry, hard, or bigger than normal.  Follow these instructions at home: Eating and drinking   Eat foods that have a lot of fiber, such as: ? Fresh fruits and vegetables. ? Whole grains. ? Beans.  Eat less of foods that are high in fat, low in fiber, or overly processed, such as: ? [place] fries. ? Hamburgers. ? Cookies. ? Candy. ? Soda.  Drink enough fluid to keep your pee (urine) clear or pale yellow. General instructions  Exercise regularly or as told by your doctor.  Go to the restroom when you feel like you need to poop. Do not hold it in.  Take over-the-counter and prescription medicines only as told by your doctor. These include any fiber supplements.  Do pelvic floor retraining exercises, such as: ? Doing deep breathing while relaxing your lower belly (abdomen). ? Relaxing your pelvic floor while pooping.  Watch your condition for any changes.  Keep all follow-up visits as told by your doctor. This is important. Contact a doctor if:  You have pain that gets worse.  You have a fever.  You have not pooped for 4 days.  You throw up (vomit).  You are not hungry.  You lose weight.  You are bleeding from the anus.  You have thin, pencil-like poop (stool). Get help right away if:  You have a fever, and your symptoms suddenly get worse.  You leak poop or have blood in your poop.  Your belly feels hard or bigger than normal (is bloated).  You have very bad belly pain.  You feel dizzy or you faint. This information is not intended to replace advice given to you by your health care provider. Make sure you discuss any questions you have with your health care provider. Document Released: 06/26/2007 Document Revised: 07/28/2015 Document Reviewed: 06/28/2015 Elsevier Interactive Patient Education   2018 [place].   Inguinal Hernia, Adult An inguinal hernia is when fat or the intestines push through the area where the leg meets the lower belly (groin) and make a rounded lump (bulge). This condition happens over time. There are three types of inguinal hernias. These types include:  Hernias that can be pushed back into the belly (are reducible).  Hernias that cannot be pushed back into the belly (are incarcerated).  Hernias that cannot be pushed back into the belly and lose their blood supply (get strangulated). This type needs emergency surgery.  Follow these instructions at home: Lifestyle  Drink enough fluid to keep your urine (pee) clear or pale yellow.  Eat plenty of fruits, vegetables, and whole grains. These have a lot of fiber. Talk with your doctor if you have questions.  Avoid lifting heavy objects.  Avoid standing for long periods of time.  Do not use tobacco products. These include cigarettes, chewing tobacco, or e-cigarettes. If you need help quitting, ask your doctor.  Try to stay at a healthy weight. General instructions  Do not try to force the hernia back in.  Watch your hernia for any changes in color or size. Let your doctor know if there are any changes.  Take over-the-counter and prescription medicines only as told by your doctor.  Keep all follow-up visits as told by your doctor. This is important. Contact a doctor if:  You have a fever.  You have new symptoms.  Your symptoms get worse. Get help right away if:  The area where the legs meets the lower belly has: ? Pain that gets worse suddenly. ? A bulge that gets bigger suddenly and does not go down. ? A bulge that turns red or purple. ? A bulge that is painful to the touch.  You are a man and your scrotum: ? Suddenly feels painful. ? Suddenly changes in size.  You feel sick to your stomach (nauseous) and this feeling does not go away.  You throw up (vomit) and this keeps  happening.  You feel your heart beating a lot more quickly than normal.  You cannot poop (have a bowel movement) or pass gas. This information is not intended to replace advice given to you by your health care provider. Make sure you discuss any questions you have with your health care provider. Document Released: 02/07/2006 Document Revised: 06/15/2015 Document Reviewed: 11/17/2013 Elsevier Interactive Patient Education  2018 [employer].

## 2017-10-03 ENCOUNTER — Encounter: Payer: Self-pay | Admitting: Surgery

## 2017-10-10 ENCOUNTER — Encounter: Payer: Self-pay | Admitting: Family Medicine

## 2017-10-10 ENCOUNTER — Ambulatory Visit (INDEPENDENT_AMBULATORY_CARE_PROVIDER_SITE_OTHER): Payer: Medicare HMO | Admitting: Family Medicine

## 2017-10-10 VITALS — BP 135/68 | HR 72 | Temp 97.8°F | Resp 16 | Wt 159.0 lb

## 2017-10-10 DIAGNOSIS — J301 Allergic rhinitis due to pollen: Secondary | ICD-10-CM | POA: Diagnosis not present

## 2017-10-10 DIAGNOSIS — M79672 Pain in left foot: Secondary | ICD-10-CM

## 2017-10-10 DIAGNOSIS — G8929 Other chronic pain: Secondary | ICD-10-CM | POA: Diagnosis not present

## 2017-10-10 NOTE — Progress Notes (Signed)
Patient: Natalie Bray Female    DOB: [birth year]   83 y.o.   MRN: [id number] Visit Date: 10/10/2017  Today's Provider: Lelon Huh, MD   No chief complaint on file.  Subjective:    HPI  Patient is here to discuss getting referral to podiatry, for left foot pain. Patient states she has been seeing Dr. Milinda Pointer at [facility] for years, last seen a few years ago Pain is across left lateral foot and ankle off and on for many years. Due to remote injury, but has been bothering her much more lately. Not responding to Tylenol. She states she made appointment to see Dr. Milinda Pointer next Wednesday, but the require referral apparently since it has been several years since she was last seen.    She is also having trouble with drainage down the back of throat making her cough for the last few weeks. She hasn't taken allergy medications since she is not sure what is safe to take with her diabetes and high blood pressure.   Allergies  Allergen Reactions  . Celecoxib Rash and Other (See Comments)    Flushed red flushing Flushed red      Current Outpatient Medications:  .  ACCU-CHEK AVIVA PLUS test strip, TEST BLOOD SUGAR EVERY DAY, Disp: 100 each, Rfl: 4 .  Acetaminophen (TYLENOL ARTHRITIS PAIN PO), Take by mouth daily as needed., Disp: , Rfl:  .  amLODipine (NORVASC) 10 MG tablet, TAKE 1 TABLET EVERY DAY, Disp: 90 tablet, Rfl: 4 .  aspirin 81 MG chewable tablet, Chew 81 mg by mouth daily., Disp: , Rfl:  .  calcium-vitamin D (CALCIUM 500/D) 500-200 MG-UNIT per tablet, Take 1 tablet by mouth daily., Disp: , Rfl:  .  Cholecalciferol (VITAMIN D3) 1000 units CAPS, Take by mouth., Disp: , Rfl:  .  cloNIDine (CATAPRES) 0.1 MG tablet, Take one tablet as needed for blood pressure over 180. May repeat in 30 minutes. Do not take more than 3 in a day, Disp: 30 tablet, Rfl: 1 .  docusate sodium (COLACE) 100 MG capsule, Take 100 mg by mouth daily., Disp: , Rfl:  .  glipiZIDE (GLUCOTROL XL) 5 MG 24 hr  tablet, Take 1 tablet (5 mg total) by mouth daily with breakfast., Disp: 90 tablet, Rfl: 3 .  lisinopril-hydrochlorothiazide (PRINZIDE,ZESTORETIC) 20-12.5 MG tablet, TAKE 2 TABLETS EVERY DAY (Patient taking differently: TAKE 1 TABLETS EVERY DAY), Disp: 180 tablet, Rfl: 4 .  lovastatin (MEVACOR) 40 MG tablet, TAKE 1 TABLET EVERY DAY, Disp: 90 tablet, Rfl: 4 .  metFORMIN (GLUCOPHAGE) 1000 MG tablet, TAKE 1 TABLET TWICE DAILY, Disp: 180 tablet, Rfl: 4 .  Nutritional Supplements (VITAMIN D MAINTENANCE) THERAPY PACK MISC, Take by mouth., Disp: , Rfl:  .  Omega-3 1000 MG CAPS, Take 1 capsule by mouth daily. , Disp: , Rfl:  .  omeprazole (PRILOSEC) 20 MG capsule, TAKE 1 CAPSULE EVERY DAY, Disp: 90 capsule, Rfl: 4 .  vitamin E (E-400) 400 UNIT capsule, Take 1 capsule by mouth daily., Disp: , Rfl:   Review of Systems  Constitutional: Negative for appetite change, chills, fatigue and fever.  Respiratory: Negative for chest tightness and shortness of breath.   Cardiovascular: Negative for chest pain and palpitations.  Gastrointestinal: Negative for abdominal pain, nausea and vomiting.  Neurological: Negative for dizziness and weakness.    Social History   Tobacco Use  . Smoking status: Never Smoker  . Smokeless tobacco: Never Used  Substance Use Topics  . Alcohol  use: No    Alcohol/week: 0.0 standard drinks   Objective:   BP 135/68 (BP Location: Right Arm, Cuff Size: Large)   Pulse 72   Temp 97.8 F (36.6 C) (Oral)   Resp 16   Wt 159 lb (72.1 kg)   SpO2 97%   BMI 27.29 kg/m     Physical Exam  General Appearance:    Alert, cooperative, no distress  HENT:    nasal mucosa pale and congested. Trace post nasal drainage.   MS:    Tender left lateral foot. No gross deformities.              Assessment & Plan:     1. Chronic foot pain, left Has appointment scheduled with Dr. Milinda Pointer next week. Needs referral for insurance.  - Ambulatory referral to Podiatry  2. Allergic rhinitis due to  pollen, unspecified seasonality Patient Instructions   Try OTC fexofenadine (Allegra) for sinus drainage and allergies.    Call for prescription for montelukast if Allegra doesn't control allergy symptoms.         Lelon Huh, MD  [facility]

## 2017-10-10 NOTE — Patient Instructions (Signed)
   Try OTC fexofenadine (Allegra) for sinus drainage and allergies.    Call for prescription for montelukast if Allegra doesn't control allergy symptoms.

## 2017-10-15 ENCOUNTER — Ambulatory Visit: Payer: Medicare HMO | Admitting: Podiatry

## 2017-10-15 ENCOUNTER — Encounter: Payer: Self-pay | Admitting: Podiatry

## 2017-10-15 ENCOUNTER — Ambulatory Visit: Payer: Medicare HMO | Admitting: Orthotics

## 2017-10-15 DIAGNOSIS — M7752 Other enthesopathy of left foot: Secondary | ICD-10-CM

## 2017-10-15 DIAGNOSIS — Q6650 Congenital pes planus, unspecified foot: Secondary | ICD-10-CM

## 2017-10-15 DIAGNOSIS — M722 Plantar fascial fibromatosis: Secondary | ICD-10-CM

## 2017-10-15 DIAGNOSIS — M779 Enthesopathy, unspecified: Secondary | ICD-10-CM | POA: Diagnosis not present

## 2017-10-15 NOTE — Progress Notes (Signed)
She presents today for follow-up of her pes planus plan a fasciitis left foot.  She states that it feels like the foot is rolling and that she refers that the orthotics are not high enough.  Objective: Vital signs are stable alert and oriented x3.  Pulses are palpable.  She has pain on palpation medial calcaneal tubercle of the left heel she also has a very prominent navicular tuberosity and pain on end range of motion of the subtalar joint and on palpation of the sinus tarsi.  Assessment: Pes planus resulting in impingement of the subtalar joint and plantar fasciitis.  Plan: Injected the plantar fascia today as well as the subtalar joint after sterile Betadine skin prep with 20 mg of Kenalog 5 mg Marcaine were injected to each site.  Tolerated procedure well without complications.  Natalie Bray will fabricate her a knee brace for her medial collapse.  She will follow-up with [place] once that comes in.

## 2017-10-15 NOTE — Progress Notes (Signed)
Patient presents today with a hx of PTTD/AAF.  Upon assessment, patient has pronounced pes planus w/ a valgus RF deformity.  Patient has medially shifted talus/navicular.  Goal is provide longitudinal arch support and RF stability.    Patient has RF fixed valgus, with prominent navicular.  Plan on [place] brace via [place]

## 2017-11-12 ENCOUNTER — Other Ambulatory Visit: Payer: Self-pay | Admitting: Family Medicine

## 2017-11-12 NOTE — Telephone Encounter (Signed)
Please clarify with patient how much lisinopril-hctz she is taking. Prescription is written for 2 a day, but chart says she is taking 1 a day.

## 2017-11-14 NOTE — Telephone Encounter (Signed)
Pt reports she take two tablets every day.   Thanks,   -Mickel Baas

## 2017-11-19 ENCOUNTER — Encounter

## 2017-11-19 ENCOUNTER — Other Ambulatory Visit: Payer: Medicare HMO | Admitting: Orthotics

## 2017-12-10 ENCOUNTER — Other Ambulatory Visit: Payer: Medicare HMO | Admitting: Orthotics

## 2017-12-15 DIAGNOSIS — D044 Carcinoma in situ of skin of scalp and neck: Secondary | ICD-10-CM | POA: Diagnosis not present

## 2017-12-15 DIAGNOSIS — D485 Neoplasm of uncertain behavior of skin: Secondary | ICD-10-CM | POA: Diagnosis not present

## 2017-12-15 DIAGNOSIS — R202 Paresthesia of skin: Secondary | ICD-10-CM | POA: Diagnosis not present

## 2017-12-15 DIAGNOSIS — R208 Other disturbances of skin sensation: Secondary | ICD-10-CM | POA: Diagnosis not present

## 2017-12-15 DIAGNOSIS — L570 Actinic keratosis: Secondary | ICD-10-CM | POA: Diagnosis not present

## 2017-12-15 DIAGNOSIS — L821 Other seborrheic keratosis: Secondary | ICD-10-CM | POA: Diagnosis not present

## 2017-12-15 DIAGNOSIS — Z85828 Personal history of other malignant neoplasm of skin: Secondary | ICD-10-CM | POA: Diagnosis not present

## 2017-12-15 DIAGNOSIS — D0462 Carcinoma in situ of skin of left upper limb, including shoulder: Secondary | ICD-10-CM | POA: Diagnosis not present

## 2017-12-15 DIAGNOSIS — Z08 Encounter for follow-up examination after completed treatment for malignant neoplasm: Secondary | ICD-10-CM | POA: Diagnosis not present

## 2017-12-24 ENCOUNTER — Ambulatory Visit (INDEPENDENT_AMBULATORY_CARE_PROVIDER_SITE_OTHER): Payer: Medicare HMO | Admitting: Orthotics

## 2017-12-24 DIAGNOSIS — Q6650 Congenital pes planus, unspecified foot: Secondary | ICD-10-CM

## 2017-12-24 DIAGNOSIS — M722 Plantar fascial fibromatosis: Secondary | ICD-10-CM

## 2017-12-24 DIAGNOSIS — Q6652 Congenital pes planus, left foot: Secondary | ICD-10-CM

## 2017-12-24 NOTE — Progress Notes (Signed)
Patient  came in to pick up [place].  The brace fit well and immediately patient's  gait approved.  The brace provided desired m-l stability in frontal/transverse planes and aided in dorsiflexion in saggital plane. Patient was able to don and doff brace with minimal difficulty.  Overall patient pleased with fit and functionality of brace.

## 2018-01-30 DIAGNOSIS — I129 Hypertensive chronic kidney disease with stage 1 through stage 4 chronic kidney disease, or unspecified chronic kidney disease: Secondary | ICD-10-CM | POA: Insufficient documentation

## 2018-01-30 DIAGNOSIS — E1122 Type 2 diabetes mellitus with diabetic chronic kidney disease: Secondary | ICD-10-CM | POA: Insufficient documentation

## 2018-01-30 DIAGNOSIS — Z79899 Other long term (current) drug therapy: Secondary | ICD-10-CM | POA: Insufficient documentation

## 2018-01-30 DIAGNOSIS — I10 Essential (primary) hypertension: Secondary | ICD-10-CM | POA: Diagnosis not present

## 2018-01-30 DIAGNOSIS — N189 Chronic kidney disease, unspecified: Secondary | ICD-10-CM | POA: Diagnosis not present

## 2018-01-30 DIAGNOSIS — Z85828 Personal history of other malignant neoplasm of skin: Secondary | ICD-10-CM | POA: Insufficient documentation

## 2018-01-30 DIAGNOSIS — Z7982 Long term (current) use of aspirin: Secondary | ICD-10-CM | POA: Diagnosis not present

## 2018-01-30 DIAGNOSIS — D044 Carcinoma in situ of skin of scalp and neck: Secondary | ICD-10-CM | POA: Diagnosis not present

## 2018-01-30 DIAGNOSIS — Z940 Kidney transplant status: Secondary | ICD-10-CM | POA: Insufficient documentation

## 2018-01-30 DIAGNOSIS — Z7984 Long term (current) use of oral hypoglycemic drugs: Secondary | ICD-10-CM | POA: Insufficient documentation

## 2018-01-30 DIAGNOSIS — L7621 Postprocedural hemorrhage and hematoma of skin and subcutaneous tissue following a dermatologic procedure: Secondary | ICD-10-CM | POA: Diagnosis not present

## 2018-01-30 DIAGNOSIS — L7622 Postprocedural hemorrhage and hematoma of skin and subcutaneous tissue following other procedure: Secondary | ICD-10-CM | POA: Diagnosis not present

## 2018-01-31 ENCOUNTER — Other Ambulatory Visit: Payer: Self-pay

## 2018-01-31 ENCOUNTER — Emergency Department
Admission: EM | Admit: 2018-01-31 | Discharge: 2018-01-31 | Disposition: A | Discharge status: Home / Self Care | Payer: Medicare HMO | Attending: Emergency Medicine | Admitting: Emergency Medicine

## 2018-01-31 DIAGNOSIS — L7621 Postprocedural hemorrhage and hematoma of skin and subcutaneous tissue following a dermatologic procedure: Secondary | ICD-10-CM

## 2018-01-31 MED ORDER — TRANEXAMIC ACID 1000 MG/10ML IV SOLN
500.0000 mg | Freq: Once | INTRAVENOUS | Status: AC
  Administered 2018-01-31: 500 mg via TOPICAL
  Filled 2018-01-31: qty 10

## 2018-01-31 MED ORDER — SILVER NITRATE-POT NITRATE 75-25 % EX MISC
1.0000 "application " | Freq: Once | CUTANEOUS | Status: AC
  Administered 2018-01-31: 1 via TOPICAL
  Filled 2018-01-31: qty 1

## 2018-01-31 MED ORDER — LIDOCAINE-EPINEPHRINE 2 %-1:100000 IJ SOLN
20.0000 mL | Freq: Once | INTRAMUSCULAR | Status: AC
  Administered 2018-01-31: 5 mL via INTRADERMAL
  Filled 2018-01-31: qty 1

## 2018-01-31 NOTE — ED Notes (Signed)
MD at bedside. 

## 2018-01-31 NOTE — ED Provider Notes (Signed)
[facility] Emergency Department Provider Note  ____________________________________________   First MD Initiated Contact with Patient 01/31/18 0230     (approximate)  I have reviewed the triage vital signs and the nursing notes.   HISTORY  Chief Complaint Post-op Problem    HPI Natalie Bray is a 84 y.o. female who self presents to the emergency department with bleeding to her left neck.  Earlier today she had a mass removed by her dermatologist and had no bleeding for several hours.   She takes an aspirin daily but no other blood thinning medication.  Her symptoms came on gradually are now constant and moderate severity.  She has been unable to get the bleeding to stop.  No history of bleeding diatheses.   Past Medical History:  Diagnosis Date  . Anemia   . Anemia, iron deficiency 08/08/2014  . Diabetes mellitus without complication ([place])   . History of chicken pox   . History of skin cancer   . Hyperlipidemia   . Hypertension     Patient Active Problem List   Diagnosis Date Noted  . Inguinal hernia of right side without obstruction or gangrene 09/16/2017  . Microalbuminuria 10/15/2016  . Osteoporosis 03/14/2016  . Degenerative disc disease, lumbar 08/08/2014  . Hyponatremia 08/08/2014  . Hypokalemia 08/08/2014  . Varicose vein 08/08/2014  . Allergic rhinitis 08/08/2014  . GERD (gastroesophageal reflux disease) 08/08/2014  . Osteoarthritis 08/08/2014  . Type 2 diabetes mellitus with diabetic chronic kidney disease ([place]) 03/08/2013  . Hypertension 03/08/2013  . Hyperlipidemia 03/08/2013  . Post menopausal syndrome 03/08/2013    Past Surgical History:  Procedure Laterality Date  . ABDOMINAL HYSTERECTOMY    . Prolapsed bladder  03/2013   Dr. Zigmund Daniel  . REPLACEMENT TOTAL KNEE Left 01/2011   [place]  . REPLACEMENT TOTAL KNEE Right 03/2010  . TOTAL ABDOMINAL HYSTERECTOMY W/ BILATERAL SALPINGOOPHORECTOMY  1974   BSO    Prior to  Admission medications   Medication Sig Start Date End Date Taking? Authorizing Provider  ACCU-CHEK AVIVA PLUS test strip TEST BLOOD SUGAR EVERY DAY 04/08/17   Birdie Sons, MD  Acetaminophen (TYLENOL ARTHRITIS PAIN PO) Take by mouth daily as needed.    [provider]  amLODipine (NORVASC) 10 MG tablet TAKE 1 TABLET EVERY DAY 11/12/17   Birdie Sons, MD  aspirin 81 MG chewable tablet Chew 81 mg by mouth daily.    [provider]  calcium-vitamin D (CALCIUM 500/D) 500-200 MG-UNIT per tablet Take 1 tablet by mouth daily.    [provider]  Cholecalciferol (VITAMIN D3) 1000 units CAPS Take by mouth.    [provider]  cloNIDine (CATAPRES) 0.1 MG tablet Take one tablet as needed for blood pressure over 180. May repeat in 30 minutes. Do not take more than 3 in a day 04/28/17   Birdie Sons, MD  docusate sodium (COLACE) 100 MG capsule Take 100 mg by mouth daily. 03/25/13   [provider]  glipiZIDE (GLUCOTROL XL) 5 MG 24 hr tablet Take 1 tablet (5 mg total) by mouth daily with breakfast. 09/23/17   Birdie Sons, MD  lisinopril-hydrochlorothiazide (PRINZIDE,ZESTORETIC) 20-12.5 MG tablet Take 2 tablets by mouth daily. 11/14/17   Birdie Sons, MD  lovastatin (MEVACOR) 40 MG tablet TAKE 1 TABLET EVERY DAY 11/12/17   Birdie Sons, MD  metFORMIN (GLUCOPHAGE) 1000 MG tablet TAKE 1 TABLET TWICE DAILY 11/18/16   Birdie Sons, MD  Nutritional Supplements (VITAMIN  D MAINTENANCE) THERAPY PACK MISC Take by mouth.    [provider]  Omega-3 1000 MG CAPS Take 1 capsule by mouth daily.     [provider]  omeprazole (PRILOSEC) 20 MG capsule TAKE 1 CAPSULE EVERY DAY 08/13/17   Birdie Sons, MD  vitamin E (E-400) 400 UNIT capsule Take 1 capsule by mouth daily.    [provider]    Allergies Celecoxib  Family History  Problem Relation Age of Onset  . Kidney failure Mother   . Aneurysm Father   . Hypertension  Sister   . Diabetes Sister   . Diabetes Brother   . Hypertension Brother   . Diabetes Sister   . Hypertension Sister   . Diabetes Brother   . Hypertension Brother     Social History Social History   Tobacco Use  . Smoking status: Never Smoker  . Smokeless tobacco: Never Used  Substance Use Topics  . Alcohol use: No    Alcohol/week: 0.0 standard drinks  . Drug use: No    Review of Systems Constitutional: No fever/chills ENT: No sore throat. Cardiovascular: Denies chest pain. Respiratory: Denies shortness of breath. Gastrointestinal: No abdominal pain.  No nausea, no vomiting.   Skin: Positive for bleeding wound Neurological: Negative for headaches   ____________________________________________   PHYSICAL EXAM:  VITAL SIGNS: ED Triage Vitals  Enc Vitals Group     BP 01/31/18 0018 (!) 163/84     Pulse Rate 01/31/18 0018 88     Resp 01/31/18 0018 16     Temp 01/31/18 0018 97.9 F (36.6 C)     Temp Source 01/31/18 0018 Oral     SpO2 01/31/18 0018 94 %     Weight 01/31/18 0014 162 lb (73.5 kg)     Height 01/31/18 0014 5\' 4"  (1.626 m)     Head Circumference --      Peak Flow --      Pain Score 01/31/18 0013 5     Pain Loc --      Pain Edu? --      Excl. in [place]? --     Constitutional: Alert and oriented x4 nontoxic no diaphoresis speaks in full clear sentences Head: Atraumatic. Nose: No congestion/rhinnorhea. Mouth/Throat: No trismus Neck: No stridor.  See below Cardiovascular: Regular rate and rhythm Respiratory: Normal respiratory effort.  No retractions. Neurologic:  Normal speech and language. No gross focal neurologic deficits are appreciated.  Skin: Roughly 4 cm incision from her surgical site well approximated with sutures with slow venous ooze primarily coming from the superior aspect of the wound    ____________________________________________  LABS (all labs ordered are listed, but only abnormal results are displayed)  Labs Reviewed - No data  to display   __________________________________________  EKG   ____________________________________________  RADIOLOGY   ____________________________________________   DIFFERENTIAL includes but not limited to  Aneurysm, bleeding diathesis, thrombocytopenia, bleeding wound   PROCEDURES  Procedure(s) performed: no  Procedures  Critical Care performed: no  ____________________________________________   INITIAL IMPRESSION / ASSESSMENT AND PLAN / ED COURSE  Pertinent labs & imaging results that were available during my care of the patient were reviewed by me and considered in my medical decision making (see chart for details).   As part of my medical decision making, I reviewed the following data within the [place] History obtained from family if available, nursing notes, old chart and ekg, as well as notes from prior ED visits.  The  patient comes to the emergency department oozing from her surgical site.  I initially anesthetized the area with lidocaine with epinephrine with improvement in her symptoms and then used tranexamic acid soaked 4 x 4 and put it on the wound for 30 minutes.  This resolved most of the bleeding.  I then used silver nitrate sticks and direct pressure which resolved all bleeding.  She was dressed and discharged in stable condition.      ____________________________________________   FINAL CLINICAL IMPRESSION(S) / ED DIAGNOSES  Final diagnoses:  Postoperative hemorrhage of skin following dermatologic procedure      NEW MEDICATIONS STARTED DURING THIS VISIT:  Discharge Medication List as of 01/31/2018  4:47 AM       Note:  This document was prepared using Dragon voice recognition software and may include unintentional dictation errors.     Darel Hong, MD 02/06/18 [phone]

## 2018-01-31 NOTE — ED Triage Notes (Signed)
Pt arrives to ED via POV from home with c/o bleeding from surgical site. Pt reports having surgery yesterday to remove skin cancer, states she has both internal and external sutures. Pt states bleeding started while she was sleeping and woke up when she felt something wet on her pillow. Pt denies use of anticoagulants, but dose take Aspirin daily. Pt has a clear bandage with a noticeable amount of blood underneath.

## 2018-01-31 NOTE — Discharge Instructions (Signed)
If your wound starts to bleed again, please put a single finger of direct pressure and don't release it for 10 minutes.  If it continues to bleed try this one more time but for 15 minutes.  If it still is bleeding, come back to the ER.  It was a pleasure to take care of you today, and thank you for coming to our emergency department.  If you have any questions or concerns before leaving please ask the nurse to grab me and I'm more than happy to go through your aftercare instructions again.  If you have any concerns once you are home that you are not improving or are in fact getting worse before you can make it to your follow-up appointment, please do not hesitate to call [phone] and come back for further evaluation.  Darel Hong, MD

## 2018-02-04 ENCOUNTER — Encounter: Payer: Self-pay | Admitting: Family Medicine

## 2018-02-04 ENCOUNTER — Ambulatory Visit (INDEPENDENT_AMBULATORY_CARE_PROVIDER_SITE_OTHER): Payer: Medicare HMO | Admitting: Family Medicine

## 2018-02-04 VITALS — BP 128/60 | HR 80 | Temp 97.8°F | Resp 16 | Ht 65.0 in | Wt 157.0 lb

## 2018-02-04 DIAGNOSIS — E1122 Type 2 diabetes mellitus with diabetic chronic kidney disease: Secondary | ICD-10-CM

## 2018-02-04 DIAGNOSIS — E785 Hyperlipidemia, unspecified: Secondary | ICD-10-CM | POA: Diagnosis not present

## 2018-02-04 DIAGNOSIS — N181 Chronic kidney disease, stage 1: Secondary | ICD-10-CM | POA: Diagnosis not present

## 2018-02-04 DIAGNOSIS — I10 Essential (primary) hypertension: Secondary | ICD-10-CM

## 2018-02-04 LAB — POCT GLYCOSYLATED HEMOGLOBIN (HGB A1C): Hemoglobin A1C: 6.6 % — AB (ref 4.0–5.6)

## 2018-02-04 LAB — POCT UA - MICROALBUMIN: Microalbumin Ur, POC: 100 mg/L

## 2018-02-04 NOTE — Progress Notes (Signed)
Patient: Natalie Bray Female    DOB: [birth year]   84 y.o.   MRN: [id number] Visit Date: 02/04/2018  Today's Provider: Lelon Huh, MD   Chief Complaint  Patient presents with  . Hypertension  . Hyperlipidemia  . Diabetes   Subjective:     HPI     Diabetes Mellitus Type II, Follow-up:   Lab Results  Component Value Date   HGBA1C 6.3 (A) 09/23/2017   HGBA1C 6.1 05/23/2017   HGBA1C 6.1 02/18/2017   Last seen for diabetes 4 months ago.  Management since then includes Reduce Glipizide to 5mg  daily due to hypoglycemic episodes and take evening dose of Metformin more consistently. She reports excellent compliance with treatment. She is not having side effects.  Current symptoms include none and have been stable. Home blood sugar records: trend: stable  Episodes of hypoglycemia? rarely   Current Insulin Regimen: Not taking Most Recent Eye Exam: Pt reports her last eye exam was 06/2017 Weight trend: stable Current diet: in general, a "healthy" diet   Current exercise: none  ------------------------------------------------------------------------   Hypertension, follow-up:  BP Readings from Last 3 Encounters:  02/04/18 128/60  01/31/18 133/71  10/10/17 135/68    She was last seen for hypertension 4 months ago.  BP at that visit was 125/72. Management since that visit includes No changes She reports excellent compliance with treatment. She is not having side effects.  She is not exercising. She is adherent to low salt diet.   Outside blood pressures are pt states her BP usually runs normal and only has to take Clonidine occasionally. She is experiencing lower extremity edema.  Patient denies chest pain, claudication, dyspnea, exertional chest pressure/discomfort, fatigue and palpitations.   Cardiovascular risk factors include advanced age (older than 20 for men, 82 for women), diabetes mellitus, dyslipidemia and hypertension.  Use of agents associated  with hypertension: none.   ------------------------------------------------------------------------    Lipid/Cholesterol, Follow-up:   Last seen for this 1 years ago.  Management since that visit includes No changes.  Last Lipid Panel:    Component Value Date/Time   CHOL 177 02/18/2017 0905   TRIG 210 (H) 02/18/2017 0905   HDL 59 02/18/2017 0905   CHOLHDL 3.0 02/18/2017 0905   LDLCALC 76 02/18/2017 0905    She reports excellent compliance with treatment. She is not having side effects.   Wt Readings from Last 3 Encounters:  02/04/18 157 lb (71.2 kg)  01/31/18 162 lb (73.5 kg)  10/10/17 159 lb (72.1 kg)    ------------------------------------------------------------------------    Allergies  Allergen Reactions  . Celecoxib Rash and Other (See Comments)    Flushed red flushing Flushed red      Current Outpatient Medications:  .  ACCU-CHEK AVIVA PLUS test strip, TEST BLOOD SUGAR EVERY DAY, Disp: 100 each, Rfl: 4 .  Acetaminophen (TYLENOL ARTHRITIS PAIN PO), Take by mouth daily as needed., Disp: , Rfl:  .  amLODipine (NORVASC) 10 MG tablet, TAKE 1 TABLET EVERY DAY, Disp: 90 tablet, Rfl: 4 .  aspirin 81 MG chewable tablet, Chew 81 mg by mouth daily., Disp: , Rfl:  .  calcium-vitamin D (CALCIUM 500/D) 500-200 MG-UNIT per tablet, Take 1 tablet by mouth daily., Disp: , Rfl:  .  Cholecalciferol (VITAMIN D3) 1000 units CAPS, Take by mouth., Disp: , Rfl:  .  cloNIDine (CATAPRES) 0.1 MG tablet, Take one tablet as needed for blood pressure over 180. May repeat in 30 minutes. Do not take more  than 3 in a day, Disp: 30 tablet, Rfl: 1 .  docusate sodium (COLACE) 100 MG capsule, Take 100 mg by mouth daily., Disp: , Rfl:  .  glipiZIDE (GLUCOTROL XL) 5 MG 24 hr tablet, Take 1 tablet (5 mg total) by mouth daily with breakfast., Disp: 90 tablet, Rfl: 3 .  lisinopril-hydrochlorothiazide (PRINZIDE,ZESTORETIC) 20-12.5 MG tablet, Take 2 tablets by mouth daily., Disp: 180 tablet, Rfl:  4 .  lovastatin (MEVACOR) 40 MG tablet, TAKE 1 TABLET EVERY DAY, Disp: 90 tablet, Rfl: 4 .  metFORMIN (GLUCOPHAGE) 1000 MG tablet, TAKE 1 TABLET TWICE DAILY, Disp: 180 tablet, Rfl: 4 .  Omega-3 1000 MG CAPS, Take 1 capsule by mouth daily. , Disp: , Rfl:  .  omeprazole (PRILOSEC) 20 MG capsule, TAKE 1 CAPSULE EVERY DAY, Disp: 90 capsule, Rfl: 4 .  vitamin E (E-400) 400 UNIT capsule, Take 1 capsule by mouth daily., Disp: , Rfl:  .  Nutritional Supplements (VITAMIN D MAINTENANCE) THERAPY PACK MISC, Take by mouth., Disp: , Rfl:   Review of Systems  Constitutional: Negative for appetite change, chills, fatigue and fever.  Respiratory: Negative for chest tightness and shortness of breath.   Cardiovascular: Negative for chest pain and palpitations.  Gastrointestinal: Negative for abdominal pain, nausea and vomiting.  Neurological: Negative for dizziness and weakness.    Social History   Tobacco Use  . Smoking status: Never Smoker  . Smokeless tobacco: Never Used  Substance Use Topics  . Alcohol use: No    Alcohol/week: 0.0 standard drinks      Objective:   BP 128/60 (BP Location: Right Arm, Patient Position: Sitting, Cuff Size: Normal)   Pulse 80   Temp 97.8 F (36.6 C) (Oral)   Resp 16   Ht 5\' 5"  (1.651 m)   Wt 157 lb (71.2 kg)   BMI 26.13 kg/m  Vitals:   02/04/18 0825  BP: 128/60  Pulse: 80  Resp: 16  Temp: 97.8 F (36.6 C)  TempSrc: Oral  Weight: 157 lb (71.2 kg)  Height: 5\' 5"  (1.651 m)     Physical Exam   General Appearance:    Alert, cooperative, no distress  Eyes:    PERRL, conjunctiva/corneas clear, EOM's intact       Lungs:     Clear to auscultation bilaterally, respirations unlabored  Heart:    Regular rate and rhythm  Neurologic:   Awake, alert, oriented x 3. No apparent focal neurological           defect.       Results for orders placed or performed in visit on 02/04/18  POCT glycosylated hemoglobin (Hb A1C)  Result Value Ref Range   Hemoglobin A1C  6.6 (A) 4.0 - 5.6 %  POCT UA - Microalbumin  Result Value Ref Range   Microalbumin Ur, POC 100 mg/L       Assessment & Plan    1. Type 2 diabetes mellitus with stage 1 chronic kidney disease, without long-term current use of insulin (HCC) Well controlled.  Hypoglycemia mostly resolved since reducing glipizide dose. Continue current medications.   - POCT glycosylated hemoglobin (Hb A1C) - POCT UA - Microalbumin  2. Essential hypertension Well controlled.  Continue current medications.    3. Hyperlipidemia, unspecified hyperlipidemia type She is tolerating lovastatin well with no adverse effects.   - Comprehensive metabolic panel - Lipid panel  Follow up 4-5 months.     Lelon Huh, MD  [facility]

## 2018-02-04 NOTE — Patient Instructions (Signed)
.   Please bring all of your medications to every appointment so we can make sure that our medication list is the same as yours.   

## 2018-02-05 ENCOUNTER — Telehealth: Payer: Self-pay

## 2018-02-05 LAB — COMPREHENSIVE METABOLIC PANEL
ALBUMIN: 4.4 g/dL (ref 3.5–4.7)
ALK PHOS: 72 IU/L (ref 39–117)
ALT: 18 IU/L (ref 0–32)
AST: 16 IU/L (ref 0–40)
Albumin/Globulin Ratio: 1.8 (ref 1.2–2.2)
BILIRUBIN TOTAL: 0.2 mg/dL (ref 0.0–1.2)
BUN / CREAT RATIO: 20 (ref 12–28)
BUN: 15 mg/dL (ref 8–27)
CHLORIDE: 101 mmol/L (ref 96–106)
CO2: 27 mmol/L (ref 20–29)
Calcium: 10.6 mg/dL — ABNORMAL HIGH (ref 8.7–10.3)
Creatinine, Ser: 0.74 mg/dL (ref 0.57–1.00)
GFR calc Af Amer: 88 mL/min/{1.73_m2} (ref >59)
GFR calc non Af Amer: 76 mL/min/{1.73_m2} (ref >59)
GLUCOSE: 111 mg/dL — AB (ref 65–99)
Globulin, Total: 2.5 g/dL (ref 1.5–4.5)
Potassium: 4 mmol/L (ref 3.5–5.2)
Sodium: 144 mmol/L (ref 134–144)
Total Protein: 6.9 g/dL (ref 6.0–8.5)

## 2018-02-05 LAB — LIPID PANEL
CHOLESTEROL TOTAL: 190 mg/dL (ref 100–199)
Chol/HDL Ratio: 3.5 ratio (ref 0.0–4.4)
HDL: 55 mg/dL (ref >39)
LDL Calculated: 72 mg/dL (ref 0–99)
Triglycerides: 317 mg/dL — ABNORMAL HIGH (ref 0–149)
VLDL CHOLESTEROL CAL: 63 mg/dL — AB (ref 5–40)

## 2018-02-05 NOTE — Telephone Encounter (Signed)
Pt advised.   Thanks,   -Laura  

## 2018-02-05 NOTE — Telephone Encounter (Signed)
-----   Message from Birdie Sons, MD sent at 02/05/2018  8:15 AM EST ----- Cholesterol is very good at 190. Normal blood sugar, kidney and liver functions. Continue current medications.  Follow up 06-24-2018 as scheduled.

## 2018-02-10 DIAGNOSIS — D0462 Carcinoma in situ of skin of left upper limb, including shoulder: Secondary | ICD-10-CM | POA: Diagnosis not present

## 2018-03-12 DIAGNOSIS — E119 Type 2 diabetes mellitus without complications: Secondary | ICD-10-CM | POA: Diagnosis not present

## 2018-03-12 DIAGNOSIS — H353131 Nonexudative age-related macular degeneration, bilateral, early dry stage: Secondary | ICD-10-CM | POA: Diagnosis not present

## 2018-03-12 DIAGNOSIS — H04123 Dry eye syndrome of bilateral lacrimal glands: Secondary | ICD-10-CM | POA: Diagnosis not present

## 2018-06-08 ENCOUNTER — Other Ambulatory Visit: Payer: Self-pay | Admitting: Family Medicine

## 2018-06-11 DIAGNOSIS — D0471 Carcinoma in situ of skin of right lower limb, including hip: Secondary | ICD-10-CM | POA: Diagnosis not present

## 2018-06-11 DIAGNOSIS — L570 Actinic keratosis: Secondary | ICD-10-CM | POA: Diagnosis not present

## 2018-06-11 DIAGNOSIS — D485 Neoplasm of uncertain behavior of skin: Secondary | ICD-10-CM | POA: Diagnosis not present

## 2018-06-11 DIAGNOSIS — D0472 Carcinoma in situ of skin of left lower limb, including hip: Secondary | ICD-10-CM | POA: Diagnosis not present

## 2018-06-11 DIAGNOSIS — L538 Other specified erythematous conditions: Secondary | ICD-10-CM | POA: Diagnosis not present

## 2018-06-11 DIAGNOSIS — Z85828 Personal history of other malignant neoplasm of skin: Secondary | ICD-10-CM | POA: Diagnosis not present

## 2018-06-11 DIAGNOSIS — L820 Inflamed seborrheic keratosis: Secondary | ICD-10-CM | POA: Diagnosis not present

## 2018-06-11 DIAGNOSIS — R208 Other disturbances of skin sensation: Secondary | ICD-10-CM | POA: Diagnosis not present

## 2018-06-11 DIAGNOSIS — Z08 Encounter for follow-up examination after completed treatment for malignant neoplasm: Secondary | ICD-10-CM | POA: Diagnosis not present

## 2018-06-11 DIAGNOSIS — X32XXXA Exposure to sunlight, initial encounter: Secondary | ICD-10-CM | POA: Diagnosis not present

## 2018-06-11 DIAGNOSIS — C44719 Basal cell carcinoma of skin of left lower limb, including hip: Secondary | ICD-10-CM | POA: Diagnosis not present

## 2018-06-11 DIAGNOSIS — C4441 Basal cell carcinoma of skin of scalp and neck: Secondary | ICD-10-CM | POA: Diagnosis not present

## 2018-06-19 DIAGNOSIS — D0472 Carcinoma in situ of skin of left lower limb, including hip: Secondary | ICD-10-CM | POA: Diagnosis not present

## 2018-06-19 DIAGNOSIS — C4441 Basal cell carcinoma of skin of scalp and neck: Secondary | ICD-10-CM | POA: Diagnosis not present

## 2018-06-19 DIAGNOSIS — D044 Carcinoma in situ of skin of scalp and neck: Secondary | ICD-10-CM | POA: Diagnosis not present

## 2018-06-23 ENCOUNTER — Emergency Department
Admission: EM | Admit: 2018-06-23 | Discharge: 2018-06-23 | Disposition: A | Discharge status: Home / Self Care | Payer: Medicare HMO | Attending: Emergency Medicine | Admitting: Emergency Medicine

## 2018-06-23 ENCOUNTER — Emergency Department: Payer: Medicare HMO

## 2018-06-23 ENCOUNTER — Other Ambulatory Visit: Payer: Self-pay

## 2018-06-23 DIAGNOSIS — E119 Type 2 diabetes mellitus without complications: Secondary | ICD-10-CM | POA: Insufficient documentation

## 2018-06-23 DIAGNOSIS — Z96653 Presence of artificial knee joint, bilateral: Secondary | ICD-10-CM | POA: Insufficient documentation

## 2018-06-23 DIAGNOSIS — E876 Hypokalemia: Secondary | ICD-10-CM | POA: Diagnosis not present

## 2018-06-23 DIAGNOSIS — R531 Weakness: Secondary | ICD-10-CM | POA: Diagnosis not present

## 2018-06-23 DIAGNOSIS — Z7982 Long term (current) use of aspirin: Secondary | ICD-10-CM | POA: Insufficient documentation

## 2018-06-23 DIAGNOSIS — Z79899 Other long term (current) drug therapy: Secondary | ICD-10-CM | POA: Diagnosis not present

## 2018-06-23 DIAGNOSIS — J9811 Atelectasis: Secondary | ICD-10-CM | POA: Diagnosis not present

## 2018-06-23 DIAGNOSIS — I10 Essential (primary) hypertension: Secondary | ICD-10-CM | POA: Insufficient documentation

## 2018-06-23 DIAGNOSIS — N3000 Acute cystitis without hematuria: Secondary | ICD-10-CM | POA: Diagnosis not present

## 2018-06-23 DIAGNOSIS — Z7984 Long term (current) use of oral hypoglycemic drugs: Secondary | ICD-10-CM | POA: Diagnosis not present

## 2018-06-23 LAB — URINALYSIS, COMPLETE (UACMP) WITH MICROSCOPIC
Bacteria, UA: NONE SEEN
Bilirubin Urine: NEGATIVE
Glucose, UA: NEGATIVE mg/dL
Hgb urine dipstick: NEGATIVE
Ketones, ur: NEGATIVE mg/dL
Nitrite: NEGATIVE
Protein, ur: NEGATIVE mg/dL
Specific Gravity, Urine: 1.003 — ABNORMAL LOW (ref 1.005–1.030)
pH: 7 (ref 5.0–8.0)

## 2018-06-23 LAB — CBC
HCT: 37.3 % (ref 36.0–46.0)
Hemoglobin: 12 g/dL (ref 12.0–15.0)
MCH: 27.4 pg (ref 26.0–34.0)
MCHC: 32.2 g/dL (ref 30.0–36.0)
MCV: 85.2 fL (ref 80.0–100.0)
Platelets: 350 10*3/uL (ref 150–400)
RBC: 4.38 MIL/uL (ref 3.87–5.11)
RDW: 14.7 % (ref 11.5–15.5)
WBC: 8.1 10*3/uL (ref 4.0–10.5)
nRBC: 0 % (ref 0.0–0.2)

## 2018-06-23 LAB — BASIC METABOLIC PANEL
Anion gap: 12 (ref 5–15)
BUN: 16 mg/dL (ref 8–23)
CO2: 25 mmol/L (ref 22–32)
Calcium: 10.2 mg/dL (ref 8.9–10.3)
Chloride: 100 mmol/L (ref 98–111)
Creatinine, Ser: 0.81 mg/dL (ref 0.44–1.00)
GFR calc Af Amer: >60 mL/min (ref >60)
GFR calc non Af Amer: >60 mL/min (ref >60)
Glucose, Bld: 84 mg/dL (ref 70–99)
Potassium: 3.2 mmol/L — ABNORMAL LOW (ref 3.5–5.1)
Sodium: 137 mmol/L (ref 135–145)

## 2018-06-23 LAB — BRAIN NATRIURETIC PEPTIDE: B Natriuretic Peptide: 60 pg/mL (ref 0.0–100.0)

## 2018-06-23 LAB — TROPONIN I: Troponin I: <0.03 ng/mL (ref <0.03)

## 2018-06-23 LAB — TSH: TSH: 2.886 u[IU]/mL (ref 0.350–4.500)

## 2018-06-23 MED ORDER — POTASSIUM CHLORIDE CRYS ER 20 MEQ PO TBCR: 20.0000 meq | EXTENDED_RELEASE_TABLET | Freq: Two times a day (BID) | ORAL | 0 refills | Status: DC

## 2018-06-23 MED ORDER — CEPHALEXIN 500 MG PO CAPS: 500.0000 mg | ORAL_CAPSULE | Freq: Three times a day (TID) | ORAL | 0 refills | Status: AC

## 2018-06-23 MED ORDER — POTASSIUM CHLORIDE CRYS ER 20 MEQ PO TBCR
40.0000 meq | EXTENDED_RELEASE_TABLET | Freq: Once | ORAL | Status: AC
  Administered 2018-06-23: 40 meq via ORAL
  Filled 2018-06-23: qty 2

## 2018-06-23 NOTE — ED Notes (Signed)
Pt reports noting her BP was elevated approx 1.5 hours ago. Pt called PCP and was told to take an extra Clonidine 0.1mg . Pt took pill at 13:00 and reports a relief in symptoms at this time. Immediately after taking the pill pt reports having a near syncopal episode. Pt heard her "heart racing" in her right ear, her face became hot and she became weak.

## 2018-06-23 NOTE — ED Notes (Signed)
Pt reporting concern for her blood glucose level. Meal tray provided.

## 2018-06-23 NOTE — ED Provider Notes (Addendum)
[facility] Provider Note   CSN: [id number] Arrival date & time: 06/23/18  1324    History   Chief Complaint Chief Complaint  Patient presents with  . Weakness    HPI Natalie Bray is a 84 y.o. female.     HPI   84 yo F with PMHx DM, HTN, HLD here with elevated blood pressure with transient weakness.  Patient states she is in her usual state of health until she was eating today.  She developed acute onset of a flushing sensation with generalized weakness.  She had no chest pain with it.  She states she felt shaky and like her sugar was potentially low.  She did not check it.  She checked her blood pressure and it was 2 02-6 20 systolic over 37C.  She also had a elevation in her heart rate.  Since then, she took a clonidine which she has for as needed high blood pressure from her PCP.  She now feels completely better.  She denies any palpitations during the episode.  She denies any other medical complaints.  No fevers or chills.  No recent changes in her medication.  She denies any focal numbness or weakness.  No headache.  She states she feels much better and would like to return home.  Past Medical History:  Diagnosis Date  . Anemia   . Anemia, iron deficiency 08/08/2014  . Diabetes mellitus without complication ([place])   . History of chicken pox   . History of skin cancer   . Hyperlipidemia   . Hypertension     Patient Active Problem List   Diagnosis Date Noted  . Inguinal hernia of right side without obstruction or gangrene 09/16/2017  . Microalbuminuria 10/15/2016  . Osteoporosis 03/14/2016  . Degenerative disc disease, lumbar 08/08/2014  . Hyponatremia 08/08/2014  . Hypokalemia 08/08/2014  . Varicose vein 08/08/2014  . Allergic rhinitis 08/08/2014  . GERD (gastroesophageal reflux disease) 08/08/2014  . Osteoarthritis 08/08/2014  . Type 2 diabetes mellitus with diabetic chronic kidney disease ([place]) 03/08/2013  . Hypertension  03/08/2013  . Hyperlipidemia 03/08/2013  . Post menopausal syndrome 03/08/2013    Past Surgical History:  Procedure Laterality Date  . ABDOMINAL HYSTERECTOMY    . Prolapsed bladder  03/2013   Dr. Zigmund Daniel  . REPLACEMENT TOTAL KNEE Left 01/2011   [facility]  . REPLACEMENT TOTAL KNEE Right 03/2010  . TOTAL ABDOMINAL HYSTERECTOMY W/ BILATERAL SALPINGOOPHORECTOMY  1974   BSO     OB History    Gravida  2   Para  2   Term  2   Preterm      AB      Living  2     SAB      TAB      Ectopic      Multiple      Live Births  2            Home Medications    Prior to Admission medications   Medication Sig Start Date End Date Taking? Authorizing Provider  ACCU-CHEK AVIVA PLUS test strip TEST BLOOD SUGAR EVERY DAY 06/08/18   Birdie Sons, MD  Acetaminophen (TYLENOL ARTHRITIS PAIN PO) Take by mouth daily as needed.    [provider]  amLODipine (NORVASC) 10 MG tablet TAKE 1 TABLET EVERY DAY 11/12/17   Birdie Sons, MD  aspirin 81 MG chewable tablet Chew 81 mg by mouth daily.    [provider]  calcium-vitamin D (CALCIUM 500/D) 500-200 MG-UNIT per tablet Take 1 tablet by mouth daily.    [provider]  cephALEXin (KEFLEX) 500 MG capsule Take 1 capsule (500 mg total) by mouth 3 (three) times daily for 7 days. 06/23/18 06/30/18  Duffy Bruce, MD  Cholecalciferol (VITAMIN D3) 1000 units CAPS Take by mouth.    [provider]  cloNIDine (CATAPRES) 0.1 MG tablet Take one tablet as needed for blood pressure over 180. May repeat in 30 minutes. Do not take more than 3 in a day 04/28/17   Birdie Sons, MD  docusate sodium (COLACE) 100 MG capsule Take 100 mg by mouth daily. 03/25/13   [provider]  glipiZIDE (GLUCOTROL XL) 5 MG 24 hr tablet Take 1 tablet (5 mg total) by mouth daily with breakfast. 09/23/17   Birdie Sons, MD  lisinopril-hydrochlorothiazide (PRINZIDE,ZESTORETIC) 20-12.5 MG tablet Take 2 tablets by mouth daily.  11/14/17   Birdie Sons, MD  lovastatin (MEVACOR) 40 MG tablet TAKE 1 TABLET EVERY DAY 11/12/17   Birdie Sons, MD  metFORMIN (GLUCOPHAGE) 1000 MG tablet TAKE 1 TABLET TWICE DAILY 11/18/16   Birdie Sons, MD  Nutritional Supplements (VITAMIN D MAINTENANCE) THERAPY PACK MISC Take by mouth.    [provider]  Omega-3 1000 MG CAPS Take 1 capsule by mouth daily.     [provider]  omeprazole (PRILOSEC) 20 MG capsule TAKE 1 CAPSULE EVERY DAY 08/13/17   Birdie Sons, MD  potassium chloride SA (K-DUR) 20 MEQ tablet Take 1 tablet (20 mEq total) by mouth 2 (two) times daily for 2 days. 06/23/18 06/25/18  Duffy Bruce, MD  vitamin E (E-400) 400 UNIT capsule Take 1 capsule by mouth daily.    [provider]    Family History Family History  Problem Relation Age of Onset  . Kidney failure Mother   . Aneurysm Father   . Hypertension Sister   . Diabetes Sister   . Diabetes Brother   . Hypertension Brother   . Diabetes Sister   . Hypertension Sister   . Diabetes Brother   . Hypertension Brother     Social History Social History   Tobacco Use  . Smoking status: Never Smoker  . Smokeless tobacco: Never Used  Substance Use Topics  . Alcohol use: No    Alcohol/week: 0.0 standard drinks  . Drug use: No     Allergies   Celecoxib   Review of Systems Review of Systems  Constitutional: Positive for fatigue. Negative for chills and fever.  HENT: Negative for congestion and rhinorrhea.   Eyes: Negative for visual disturbance.  Respiratory: Negative for cough, shortness of breath and wheezing.   Cardiovascular: Negative for chest pain and leg swelling.  Gastrointestinal: Negative for abdominal pain, diarrhea, nausea and vomiting.  Genitourinary: Negative for dysuria and flank pain.  Musculoskeletal: Negative for neck pain and neck stiffness.  Skin: Negative for rash and wound.  Allergic/Immunologic: Negative for immunocompromised state.   Neurological: Positive for weakness. Negative for syncope and headaches.  All other systems reviewed and are negative.    Physical Exam Updated Vital Signs BP 130/65 (BP Location: Right Arm)   Pulse 80   Temp 98 F (36.7 C) (Oral)   Resp 16   Ht 5\' 4"  (1.626 m)   Wt 72.6 kg   SpO2 99%   BMI 27.46 kg/m   Physical Exam Vitals signs and nursing note reviewed.  Constitutional:      General:  She is not in acute distress.    Appearance: She is well-developed.  HENT:     Head: Normocephalic and atraumatic.  Eyes:     Conjunctiva/sclera: Conjunctivae normal.  Neck:     Musculoskeletal: Neck supple.  Cardiovascular:     Rate and Rhythm: Normal rate and regular rhythm.     Heart sounds: Normal heart sounds. No murmur. No friction rub.  Pulmonary:     Effort: Pulmonary effort is normal. No respiratory distress.     Breath sounds: Normal breath sounds. No wheezing or rales.  Abdominal:     General: There is no distension.     Palpations: Abdomen is soft.     Tenderness: There is no abdominal tenderness.  Skin:    General: Skin is warm.     Capillary Refill: Capillary refill takes less than 2 seconds.  Neurological:     Mental Status: She is alert and oriented to person, place, and time.     Motor: No abnormal muscle tone.     Gait: Gait normal.      ED Treatments / Results  Labs (all labs ordered are listed, but only abnormal results are displayed) Labs Reviewed  BASIC METABOLIC PANEL - Abnormal; Notable for the following components:      Result Value   Potassium 3.2 (*)    All other components within normal limits  URINALYSIS, COMPLETE (UACMP) WITH MICROSCOPIC - Abnormal; Notable for the following components:   Color, Urine STRAW (*)    APPearance CLEAR (*)    Specific Gravity, Urine 1.003 (*)    Leukocytes,Ua SMALL (*)    All other components within normal limits  CBC  TROPONIN I  TSH  BRAIN NATRIURETIC PEPTIDE    EKG None  Radiology Dg Chest 2 View   Result Date: 06/23/2018 CLINICAL DATA:  Elevated blood pressure with near syncopal episode and tachycardia. EXAM: CHEST - 2 VIEW COMPARISON:  Chest x-ray 02/25/2009 FINDINGS: The cardiac silhouette, mediastinal and hilar contours are within normal limits and stable. Mild tortuosity of the thoracic aorta. Minimal streaky bibasilar atelectasis but no infiltrates, edema or effusions. The bony thorax is intact. IMPRESSION: Minimal streaky bibasilar atelectasis but no infiltrates, edema or effusions. Electronically Signed   By: Marijo Sanes M.D.   On: 06/23/2018 15:18    Procedures Procedures (including critical care time)  Medications Ordered in ED Medications  potassium chloride SA (K-DUR) CR tablet 40 mEq (40 mEq Oral Given 06/23/18 1525)     Initial Impression / Assessment and Plan / ED Course  I have reviewed the triage vital signs and the nursing notes.  Pertinent labs & imaging results that were available during my care of the patient were reviewed by me and considered in my medical decision making (see chart for details).        84 year old female here with transient sensation of feeling generally weak with hypertension and tachycardia.  Differential includes possible transient arrhythmia, symptomatic hypertension, anxiety.  She is adamant that she had no chest pain and EKG is nonischemic here.  I have a low concern for ACS, PE, or dissection.  She had no focal neurological symptoms and I do not suspect TIA or CVA.  She is at her mental baseline now.  Will check screening labs.  If labs are unremarkable, I think it is reasonable to discharge her with outpatient follow-up for possible outpatient Holter monitoring and further work-up.  She would like to return home, which I think is reasonable based  on shared decision-making discussion.  Will follow up labs.  Labs reassuring. UA with possible UTI - will treat given age. K low and has been replaced, renal function normal. Trop neg, reassuring  and EKG is non-ischemic. Pt does have some frequent pACs on monitor.   Discussed observation in hospital versus d/c with follow-up. Pt states her sx are resolved and she would like to return home. She actually has a PCP appt tomorrow AM. Given her reassuring telemetry and labs, absence of any CP, feel this is reasonable. Will have her hydrate, f/u in AM. Return precautions given.  Final Clinical Impressions(s) / ED Diagnoses   Final diagnoses:  Weakness  Acute cystitis without hematuria  Hypokalemia    ED Discharge Orders         Ordered    potassium chloride SA (K-DUR) 20 MEQ tablet  2 times daily     06/23/18 1557    cephALEXin (KEFLEX) 500 MG capsule  3 times daily     06/23/18 1557           Duffy Bruce, MD 06/23/18 Paulette Blanch    Duffy Bruce, MD 06/23/18 1901

## 2018-06-23 NOTE — ED Triage Notes (Signed)
Pt c/o sudden onset feeling weak, shaking , b/p elevated ad feeling flushed for the past hour.

## 2018-06-24 ENCOUNTER — Ambulatory Visit (INDEPENDENT_AMBULATORY_CARE_PROVIDER_SITE_OTHER): Payer: Medicare HMO | Admitting: Family Medicine

## 2018-06-24 ENCOUNTER — Other Ambulatory Visit: Payer: Self-pay

## 2018-06-24 ENCOUNTER — Encounter: Payer: Self-pay | Admitting: Family Medicine

## 2018-06-24 VITALS — BP 132/62 | HR 66 | Temp 98.2°F | Resp 16 | Wt 159.0 lb

## 2018-06-24 DIAGNOSIS — E1122 Type 2 diabetes mellitus with diabetic chronic kidney disease: Secondary | ICD-10-CM | POA: Diagnosis not present

## 2018-06-24 DIAGNOSIS — E876 Hypokalemia: Secondary | ICD-10-CM | POA: Diagnosis not present

## 2018-06-24 DIAGNOSIS — N3090 Cystitis, unspecified without hematuria: Secondary | ICD-10-CM | POA: Diagnosis not present

## 2018-06-24 DIAGNOSIS — N181 Chronic kidney disease, stage 1: Secondary | ICD-10-CM | POA: Diagnosis not present

## 2018-06-24 LAB — POCT GLYCOSYLATED HEMOGLOBIN (HGB A1C)
Est. average glucose Bld gHb Est-mCnc: 143
Hemoglobin A1C: 6.6 % — AB (ref 4.0–5.6)

## 2018-06-24 MED ORDER — POTASSIUM CHLORIDE CRYS ER 20 MEQ PO TBCR: 20.0000 meq | EXTENDED_RELEASE_TABLET | Freq: Every day | ORAL | 0 refills | Status: DC

## 2018-06-24 NOTE — Progress Notes (Signed)
Patient: Natalie Bray Female    DOB: Nov 01, 1934   84 y.o.   MRN: [id number] Visit Date: 06/24/2018  Today's Provider: Lelon Huh, MD   Chief Complaint  Patient presents with  . Diabetes  . Hypertension   Subjective:     HPI  Diabetes Mellitus Type II, Follow-up:   Lab Results  Component Value Date   HGBA1C 6.6 (A) 02/04/2018   HGBA1C 6.3 (A) 09/23/2017   HGBA1C 6.1 05/23/2017    Last seen for diabetes 4 months ago.  Management since then includes no changes. She reports good compliance with treatment. She is not having side effects.  Current symptoms include none and have been stable. Home blood sugar records: fasting range: 129 or lower  Episodes of hypoglycemia? no   Current Insulin Regimen: none Most Recent Eye Exam: >1 year ago Weight trend: fluctuating a bit Prior visit with dietician: No Current exercise: none Current diet habits: well balanced  Pertinent Labs:    Component Value Date/Time   CHOL 190 02/04/2018 0846   TRIG 317 (H) 02/04/2018 0846   HDL 55 02/04/2018 0846   LDLCALC 72 02/04/2018 0846   CREATININE 0.81 06/23/2018 1406   CREATININE 0.59 (L) 07/23/2011 0606    Wt Readings from Last 3 Encounters:  06/24/18 159 lb (72.1 kg)  06/23/18 160 lb (72.6 kg)  02/04/18 157 lb (71.2 kg)    ------------------------------------------------------------------------  Hypertension, follow-up:  BP Readings from Last 3 Encounters:  06/24/18 132/62  06/23/18 130/65  02/04/18 128/60    She was last seen for hypertension 4 months ago.  BP at that visit was 128/60. Management since that visit includes no changes. She reports good compliance with treatment. She is not having side effects.  She is not exercising. She is adherent to low salt diet.   Outside blood pressures are 150-160/ 60 . She is experiencing fatigue.  Patient denies chest pain, chest pressure/discomfort, claudication, dyspnea, exertional chest pressure/discomfort,  irregular heart beat, lower extremity edema, near-syncope, orthopnea, palpitations, paroxysmal nocturnal dyspnea, syncope and tachypnea.   Cardiovascular risk factors include advanced age (older than 15 for men, 98 for women) and hypertension.  Use of agents associated with hypertension: NSAIDS.     Weight trend: fluctuating a bit Wt Readings from Last 3 Encounters:  06/24/18 159 lb (72.1 kg)  06/23/18 160 lb (72.6 kg)  02/04/18 157 lb (71.2 kg)    Current diet: well balanced  ------------------------------------------------------------------------  Follow up ER visit  Patient was seen in ER for weakness, tachycardia, cystitis and Hypokalemia on 06/23/2018. Treatment for this included starting K Dur for low potassium of 3.2, and prescribed cephalexin for cystitis which she has not started yet. U/a was remarkable for 11-20 wbc/hpf, otherwise normal. She was given potassium in the ER but has not picked up prescription yet.  She reports this condition is resolved.  ------------------------------------------------------------------------------------   Allergies  Allergen Reactions  . Celecoxib Rash and Other (See Comments)    Flushed red flushing Flushed red      Current Outpatient Medications:  .  ACCU-CHEK AVIVA PLUS test strip, TEST BLOOD SUGAR EVERY DAY, Disp: 100 each, Rfl: 0 .  Acetaminophen (TYLENOL ARTHRITIS PAIN PO), Take by mouth daily as needed., Disp: , Rfl:  .  amLODipine (NORVASC) 10 MG tablet, TAKE 1 TABLET EVERY DAY, Disp: 90 tablet, Rfl: 4 .  aspirin 81 MG chewable tablet, Chew 81 mg by mouth daily., Disp: , Rfl:  .  calcium-vitamin D (  CALCIUM 500/D) 500-200 MG-UNIT per tablet, Take 1 tablet by mouth daily., Disp: , Rfl:  .  Cholecalciferol (VITAMIN D3) 1000 units CAPS, Take by mouth., Disp: , Rfl:  .  cloNIDine (CATAPRES) 0.1 MG tablet, Take one tablet as needed for blood pressure over 180. May repeat in 30 minutes. Do not take more than 3 in a day, Disp: 30  tablet, Rfl: 1 .  docusate sodium (COLACE) 100 MG capsule, Take 100 mg by mouth daily., Disp: , Rfl:  .  glipiZIDE (GLUCOTROL XL) 5 MG 24 hr tablet, Take 1 tablet (5 mg total) by mouth daily with breakfast., Disp: 90 tablet, Rfl: 3 .  lisinopril-hydrochlorothiazide (PRINZIDE,ZESTORETIC) 20-12.5 MG tablet, Take 2 tablets by mouth daily., Disp: 180 tablet, Rfl: 4 .  lovastatin (MEVACOR) 40 MG tablet, TAKE 1 TABLET EVERY DAY, Disp: 90 tablet, Rfl: 4 .  metFORMIN (GLUCOPHAGE) 1000 MG tablet, TAKE 1 TABLET TWICE DAILY, Disp: 180 tablet, Rfl: 4 .  Nutritional Supplements (VITAMIN D MAINTENANCE) THERAPY PACK MISC, Take by mouth., Disp: , Rfl:  .  Omega-3 1000 MG CAPS, Take 1 capsule by mouth daily. , Disp: , Rfl:  .  omeprazole (PRILOSEC) 20 MG capsule, TAKE 1 CAPSULE EVERY DAY, Disp: 90 capsule, Rfl: 4 .  vitamin E (E-400) 400 UNIT capsule, Take 1 capsule by mouth daily., Disp: , Rfl:  .  cephALEXin (KEFLEX) 500 MG capsule, Take 1 capsule (500 mg total) by mouth 3 (three) times daily for 7 days. (Patient not taking: Reported on 06/24/2018), Disp: 21 capsule, Rfl: 0 .  potassium chloride SA (K-DUR) 20 MEQ tablet, Take 1 tablet (20 mEq total) by mouth 2 (two) times daily for 2 days. (Patient not taking: Reported on 06/24/2018), Disp: 4 tablet, Rfl: 0  Review of Systems  Constitutional: Positive for fatigue. Negative for appetite change, chills and fever.  Respiratory: Negative for chest tightness and shortness of breath.   Cardiovascular: Negative for chest pain and palpitations.  Gastrointestinal: Negative for abdominal pain, nausea and vomiting.  Neurological: Positive for weakness. Negative for dizziness.    Social History   Tobacco Use  . Smoking status: Never Smoker  . Smokeless tobacco: Never Used  Substance Use Topics  . Alcohol use: No    Alcohol/week: 0.0 standard drinks      Objective:   BP 132/62 (BP Location: Left Arm, Cuff Size: Large)   Pulse 66   Temp 98.2 F (36.8 C) (Oral)    Resp 16   Wt 159 lb (72.1 kg)   SpO2 95% Comment: room air  BMI 27.29 kg/m  Vitals:   06/24/18 0826 06/24/18 0837  BP: 140/70 132/62  Pulse: 66   Resp: 16   Temp: 98.2 F (36.8 C)   TempSrc: Oral   SpO2: 95%   Weight: 159 lb (72.1 kg)      Physical Exam   General Appearance:    Alert, cooperative, no distress  Eyes:    PERRL, conjunctiva/corneas clear, EOM's intact       Lungs:     Clear to auscultation bilaterally, respirations unlabored  Heart:    Regular rate and rhythm  Neurologic:   Awake, alert, oriented x 3. No apparent focal neurological           defect.       Results for orders placed or performed in visit on 06/24/18  POCT HgB A1C  Result Value Ref Range   Hemoglobin A1C 6.6 (A) 4.0 - 5.6 %   HbA1c POC (<>  result, manual entry)     HbA1c, POC (prediabetic range)     HbA1c, POC (controlled diabetic range)     Est. average glucose Bld gHb Est-mCnc 143        Assessment & Plan    1. Type 2 diabetes mellitus with stage 1 chronic kidney disease, without long-term current use of insulin (HCC) Well controlled.  Continue current medications.   - POCT HgB A1C  2. Hypokalemia Multifactorial, likely exacerbated by cystitis. Will start daily potassium supplement, check potassium and magnesium in about 10 days.   3. Cystitis Is to start cephalexin prescribed from ER today.     The entirety of the information documented in the History of Present Illness, Review of Systems and Physical Exam were personally obtained by me. Portions of this information were initially documented by Meyer Cory, CMA and reviewed by me for thoroughness and accuracy.   Lelon Huh, MD  [facility]

## 2018-06-24 NOTE — Patient Instructions (Signed)
.   Please review the attached list of medications and notify my office if there are any errors.   . Please bring all of your medications to every appointment so we can make sure that our medication list is the same as yours.   

## 2018-07-01 DIAGNOSIS — D0472 Carcinoma in situ of skin of left lower limb, including hip: Secondary | ICD-10-CM | POA: Diagnosis not present

## 2018-07-01 DIAGNOSIS — D0471 Carcinoma in situ of skin of right lower limb, including hip: Secondary | ICD-10-CM | POA: Diagnosis not present

## 2018-07-01 DIAGNOSIS — C4441 Basal cell carcinoma of skin of scalp and neck: Secondary | ICD-10-CM | POA: Diagnosis not present

## 2018-07-07 ENCOUNTER — Other Ambulatory Visit: Payer: Self-pay

## 2018-07-07 ENCOUNTER — Encounter: Payer: Self-pay | Admitting: Family Medicine

## 2018-07-07 ENCOUNTER — Ambulatory Visit (INDEPENDENT_AMBULATORY_CARE_PROVIDER_SITE_OTHER): Payer: Medicare HMO | Admitting: Family Medicine

## 2018-07-07 VITALS — BP 146/73 | HR 80 | Temp 98.2°F | Wt 162.0 lb

## 2018-07-07 DIAGNOSIS — N3090 Cystitis, unspecified without hematuria: Secondary | ICD-10-CM | POA: Diagnosis not present

## 2018-07-07 DIAGNOSIS — I10 Essential (primary) hypertension: Secondary | ICD-10-CM | POA: Diagnosis not present

## 2018-07-07 DIAGNOSIS — E876 Hypokalemia: Secondary | ICD-10-CM | POA: Diagnosis not present

## 2018-07-07 LAB — POCT URINALYSIS DIPSTICK
Bilirubin, UA: NEGATIVE
Blood, UA: NEGATIVE
Glucose, UA: NEGATIVE
Ketones, UA: NEGATIVE
Leukocytes, UA: NEGATIVE
Nitrite, UA: NEGATIVE
Protein, UA: NEGATIVE
Spec Grav, UA: 1.01 (ref 1.010–1.025)
Urobilinogen, UA: 0.2 E.U./dL
pH, UA: 7 (ref 5.0–8.0)

## 2018-07-07 NOTE — Progress Notes (Signed)
Patient: Natalie Bray Female    DOB: [birth year]   84 y.o.   MRN: [id number] Visit Date: 07/07/2018  Today's Provider: Lelon Huh, MD   Chief Complaint  Patient presents with  . Follow-up    Hypokalemia; UTI   Subjective:     HPI     Follow up for Hypokalemia  The patient was last seen for this 2 weeks ago. Changes made at last visit include continuing a daily potassium supplement.  Lab Results  Component Value Date   K 3.2 (L) 06/23/2018     She reports excellent compliance with treatment. She feels that condition is Improved. She is not having side effects.  She was diagnosed with uti and treated with antibiotic on 6/02-2018. She feels back to her baseline with no complaints today.  ------------------------------------------------------------------------------------   Allergies  Allergen Reactions  . Celecoxib Rash and Other (See Comments)    Flushed red flushing Flushed red      Current Outpatient Medications:  .  ACCU-CHEK AVIVA PLUS test strip, TEST BLOOD SUGAR EVERY DAY, Disp: 100 each, Rfl: 0 .  Acetaminophen (TYLENOL ARTHRITIS PAIN PO), Take by mouth daily as needed., Disp: , Rfl:  .  amLODipine (NORVASC) 10 MG tablet, TAKE 1 TABLET EVERY DAY, Disp: 90 tablet, Rfl: 4 .  aspirin 81 MG chewable tablet, Chew 81 mg by mouth daily., Disp: , Rfl:  .  calcium-vitamin D (CALCIUM 500/D) 500-200 MG-UNIT per tablet, Take 1 tablet by mouth daily., Disp: , Rfl:  .  Cholecalciferol (VITAMIN D3) 1000 units CAPS, Take by mouth., Disp: , Rfl:  .  cloNIDine (CATAPRES) 0.1 MG tablet, Take one tablet as needed for blood pressure over 180. May repeat in 30 minutes. Do not take more than 3 in a day, Disp: 30 tablet, Rfl: 1 .  docusate sodium (COLACE) 100 MG capsule, Take 100 mg by mouth daily., Disp: , Rfl:  .  glipiZIDE (GLUCOTROL XL) 5 MG 24 hr tablet, Take 1 tablet (5 mg total) by mouth daily with breakfast., Disp: 90 tablet, Rfl: 3 .   lisinopril-hydrochlorothiazide (PRINZIDE,ZESTORETIC) 20-12.5 MG tablet, Take 2 tablets by mouth daily., Disp: 180 tablet, Rfl: 4 .  lovastatin (MEVACOR) 40 MG tablet, TAKE 1 TABLET EVERY DAY, Disp: 90 tablet, Rfl: 4 .  metFORMIN (GLUCOPHAGE) 1000 MG tablet, TAKE 1 TABLET TWICE DAILY, Disp: 180 tablet, Rfl: 4 .  Omega-3 1000 MG CAPS, Take 1 capsule by mouth daily. , Disp: , Rfl:  .  omeprazole (PRILOSEC) 20 MG capsule, TAKE 1 CAPSULE EVERY DAY, Disp: 90 capsule, Rfl: 4 .  potassium chloride SA (K-DUR) 20 MEQ tablet, Take 1 tablet (20 mEq total) by mouth daily., Disp: 30 tablet, Rfl: 0 .  vitamin E (E-400) 400 UNIT capsule, Take 1 capsule by mouth daily., Disp: , Rfl:  .  Nutritional Supplements (VITAMIN D MAINTENANCE) THERAPY PACK MISC, Take by mouth., Disp: , Rfl:   Review of Systems  Constitutional: Negative.   Respiratory: Negative.   Cardiovascular: Positive for leg swelling. Negative for chest pain and palpitations.  Gastrointestinal: Negative.   Genitourinary: Negative.     Social History   Tobacco Use  . Smoking status: Never Smoker  . Smokeless tobacco: Never Used  Substance Use Topics  . Alcohol use: No    Alcohol/week: 0.0 standard drinks      Objective:   BP (!) 146/73 (BP Location: Right Arm, Patient Position: Sitting, Cuff Size: Normal)   Pulse 80  Temp 98.2 F (36.8 C) (Oral)   Wt 162 lb (73.5 kg)   BMI 27.81 kg/m  Vitals:   07/07/18 0940  BP: (!) 146/73  Pulse: 80  Temp: 98.2 F (36.8 C)  TempSrc: Oral  Weight: 162 lb (73.5 kg)     Physical Exam  General appearance: alert, well developed, well nourished, cooperative and in no distress Head: Normocephalic, without obvious abnormality, atraumatic Respiratory: Respirations even and unlabored, normal respiratory rate Extremities: No gross deformities Skin: Skin color, texture, turgor normal. No rashes seen  Psych: Appropriate mood and affect. Neurologic: Mental status: Alert, oriented to person,  place, and time, thought content appropriate. Results for orders placed or performed in visit on 07/07/18  POCT urinalysis dipstick  Result Value Ref Range   Color, UA     Clarity, UA     Glucose, UA Negative Negative   Bilirubin, UA Negative    Ketones, UA Negative    Spec Grav, UA 1.010 1.010 - 1.025   Blood, UA Negative    pH, UA 7.0 5.0 - 8.0   Protein, UA Negative Negative   Urobilinogen, UA 0.2 0.2 or 1.0 E.U./dL   Nitrite, UA Negative    Leukocytes, UA Negative Negative   Appearance     Odor         Assessment & Plan    1. Hypokalemia Still on potassium supplement - Renal function panel - Magnesium  2. Cystitis Resolved.  - POCT urinalysis dipstick  3. Essential hypertension bp has been consistently in the 140s to 150s. She endorses compliance with medications.  Consider adding low dose of spironolactone and d/c potassium supplement if electrolytes are stable.     The entirety of the information documented in the History of Present Illness, Review of Systems and Physical Exam were personally obtained by me. Portions of this information were initially documented by Ashley Royalty, CMA and reviewed by me for thoroughness and accuracy.   Lelon Huh, MD  [facility]

## 2018-07-07 NOTE — Patient Instructions (Signed)
.   Please review the attached list of medications and notify my office if there are any errors.   . Please bring all of your medications to every appointment so we can make sure that our medication list is the same as yours.   

## 2018-07-08 LAB — RENAL FUNCTION PANEL
Albumin: 4.5 g/dL (ref 3.6–4.6)
BUN/Creatinine Ratio: 23 (ref 12–28)
BUN: 21 mg/dL (ref 8–27)
CO2: 22 mmol/L (ref 20–29)
Calcium: 10.3 mg/dL (ref 8.7–10.3)
Chloride: 100 mmol/L (ref 96–106)
Creatinine, Ser: 0.93 mg/dL (ref 0.57–1.00)
GFR calc Af Amer: 67 mL/min/{1.73_m2} (ref >59)
GFR calc non Af Amer: 58 mL/min/{1.73_m2} — ABNORMAL LOW (ref >59)
Glucose: 200 mg/dL — ABNORMAL HIGH (ref 65–99)
Phosphorus: 3.9 mg/dL (ref 3.0–4.3)
Potassium: 4 mmol/L (ref 3.5–5.2)
Sodium: 140 mmol/L (ref 134–144)

## 2018-07-08 LAB — MAGNESIUM: Magnesium: 1.9 mg/dL (ref 1.6–2.3)

## 2018-07-09 ENCOUNTER — Telehealth: Payer: Self-pay

## 2018-07-09 MED ORDER — SPIRONOLACTONE 25 MG PO TABS: 25.0000 mg | ORAL_TABLET | Freq: Every day | ORAL | 0 refills | Status: DC

## 2018-07-09 NOTE — Telephone Encounter (Signed)
Pt advised.  RX sent to [facility] [address]   Thanks,   Kasie Leccese  

## 2018-07-09 NOTE — Telephone Encounter (Signed)
-----   Message from Birdie Sons, MD sent at 07/08/2018  7:56 AM EDT ----- Potassium is back to normal. She can stop potassium supplement. Recommend she start spironolactone 25mg  once daily, #30, rf x 0 to get BP down. Follow up 3-4 weeks.

## 2018-08-07 ENCOUNTER — Other Ambulatory Visit: Payer: Self-pay

## 2018-08-07 ENCOUNTER — Encounter: Payer: Self-pay | Admitting: Family Medicine

## 2018-08-07 ENCOUNTER — Ambulatory Visit (INDEPENDENT_AMBULATORY_CARE_PROVIDER_SITE_OTHER): Payer: Medicare HMO | Admitting: Family Medicine

## 2018-08-07 VITALS — BP 130/62 | HR 65 | Temp 98.4°F | Resp 16 | Wt 160.0 lb

## 2018-08-07 DIAGNOSIS — I10 Essential (primary) hypertension: Secondary | ICD-10-CM | POA: Diagnosis not present

## 2018-08-07 DIAGNOSIS — E876 Hypokalemia: Secondary | ICD-10-CM | POA: Diagnosis not present

## 2018-08-07 MED ORDER — SPIRONOLACTONE 25 MG PO TABS: 25.0000 mg | ORAL_TABLET | Freq: Every day | ORAL | 3 refills | Status: DC

## 2018-08-07 MED ORDER — SPIRONOLACTONE 25 MG PO TABS: 25.0000 mg | ORAL_TABLET | Freq: Every day | ORAL | 0 refills | Status: DC

## 2018-08-07 NOTE — Progress Notes (Signed)
Patient: Natalie Bray Female    DOB: 05/15/1934   84 y.o.   MRN: [id number] Visit Date: 08/07/2018  Today's Provider: Lelon Huh, MD   Chief Complaint  Patient presents with  . Hypertension   Subjective:     HPI  Hypertension, follow-up:  BP Readings from Last 3 Encounters:  08/07/18 130/62  07/07/18 (!) 146/73  06/24/18 132/62    She was last seen for hypertension 1 months ago.  BP at that visit was 146/73. Management since that visit includes discontinuing Potassium supplement and starting Spironolactone 25mg  daily. She reports good compliance with treatment. She is not having side effects.     Outside blood pressures are 150/60. She is experiencing none.  Patient denies chest pain, chest pressure/discomfort, claudication, dyspnea, exertional chest pressure/discomfort, fatigue, irregular heart beat, lower extremity edema, near-syncope, orthopnea, palpitations, paroxysmal nocturnal dyspnea, syncope and tachypnea.   Cardiovascular risk factors include advanced age (older than 28 for men, 67 for women) and hypertension.  Use of agents associated with hypertension: NSAIDS.     Weight trend: stable Wt Readings from Last 3 Encounters:  08/07/18 160 lb (72.6 kg)  07/07/18 162 lb (73.5 kg)  06/24/18 159 lb (72.1 kg)    Current diet: well balanced  ------------------------------------------------------------------------  Allergies  Allergen Reactions  . Celecoxib Rash and Other (See Comments)    Flushed red flushing Flushed red      Current Outpatient Medications:  .  ACCU-CHEK AVIVA PLUS test strip, TEST BLOOD SUGAR EVERY DAY, Disp: 100 each, Rfl: 0 .  Acetaminophen (TYLENOL ARTHRITIS PAIN PO), Take by mouth daily as needed., Disp: , Rfl:  .  amLODipine (NORVASC) 10 MG tablet, TAKE 1 TABLET EVERY DAY, Disp: 90 tablet, Rfl: 4 .  aspirin 81 MG chewable tablet, Chew 81 mg by mouth daily., Disp: , Rfl:  .  calcium-vitamin D (CALCIUM 500/D) 500-200  MG-UNIT per tablet, Take 1 tablet by mouth daily., Disp: , Rfl:  .  Cholecalciferol (VITAMIN D3) 1000 units CAPS, Take by mouth., Disp: , Rfl:  .  cloNIDine (CATAPRES) 0.1 MG tablet, Take one tablet as needed for blood pressure over 180. May repeat in 30 minutes. Do not take more than 3 in a day, Disp: 30 tablet, Rfl: 1 .  docusate sodium (COLACE) 100 MG capsule, Take 100 mg by mouth daily., Disp: , Rfl:  .  glipiZIDE (GLUCOTROL XL) 5 MG 24 hr tablet, Take 1 tablet (5 mg total) by mouth daily with breakfast., Disp: 90 tablet, Rfl: 3 .  lisinopril-hydrochlorothiazide (PRINZIDE,ZESTORETIC) 20-12.5 MG tablet, Take 2 tablets by mouth daily., Disp: 180 tablet, Rfl: 4 .  lovastatin (MEVACOR) 40 MG tablet, TAKE 1 TABLET EVERY DAY, Disp: 90 tablet, Rfl: 4 .  metFORMIN (GLUCOPHAGE) 1000 MG tablet, TAKE 1 TABLET TWICE DAILY, Disp: 180 tablet, Rfl: 4 .  Nutritional Supplements (VITAMIN D MAINTENANCE) THERAPY PACK MISC, Take by mouth., Disp: , Rfl:  .  Omega-3 1000 MG CAPS, Take 1 capsule by mouth daily. , Disp: , Rfl:  .  omeprazole (PRILOSEC) 20 MG capsule, TAKE 1 CAPSULE EVERY DAY, Disp: 90 capsule, Rfl: 4 .  spironolactone (ALDACTONE) 25 MG tablet, Take 1 tablet (25 mg total) by mouth daily., Disp: 30 tablet, Rfl: 0 .  vitamin E (E-400) 400 UNIT capsule, Take 1 capsule by mouth daily., Disp: , Rfl:  .  potassium chloride SA (K-DUR) 20 MEQ tablet, Take 1 tablet (20 mEq total) by mouth daily. (Patient not taking: Reported  on 08/07/2018), Disp: 30 tablet, Rfl: 0  Review of Systems  Constitutional: Negative for appetite change, chills, fatigue and fever.  Respiratory: Negative for chest tightness and shortness of breath.   Cardiovascular: Negative for chest pain and palpitations.  Gastrointestinal: Negative for abdominal pain, nausea and vomiting.  Neurological: Negative for dizziness and weakness.    Social History   Tobacco Use  . Smoking status: Never Smoker  . Smokeless tobacco: Never Used   Substance Use Topics  . Alcohol use: No    Alcohol/week: 0.0 standard drinks      Objective:   BP 130/62 (BP Location: Left Arm, Patient Position: Sitting, Cuff Size: Normal)   Pulse 65   Temp 98.4 F (36.9 C) (Oral)   Resp 16   Wt 160 lb (72.6 kg)   SpO2 97% Comment: room air  BMI 27.46 kg/m  Vitals:   08/07/18 1116  BP: 130/62  Pulse: 65  Resp: 16  Temp: 98.4 F (36.9 C)  TempSrc: Oral  SpO2: 97%  Weight: 160 lb (72.6 kg)     Physical Exam  General appearance: alert, well developed, well nourished, cooperative and in no distress Head: Normocephalic, without obvious abnormality, atraumatic Respiratory: Respirations even and unlabored, normal respiratory rate Extremities: No gross deformities Skin: Skin color, texture, turgor normal. No rashes seen  Psych: Appropriate mood and affect. Neurologic: Mental status: Alert, oriented to person, place, and time, thought content appropriate.      Assessment & Plan    1. Hypokalemia Off potassium since starting spironolactone - Potassium  2. Essential hypertension Much better since starting spironolactone.  - spironolactone (ALDACTONE) 25 MG tablet; Take 1 tablet (25 mg total) by mouth daily.  Dispense: 90 tablet; Refill: 3  The entirety of the information documented in the History of Present Illness, Review of Systems and Physical Exam were personally obtained by me. Portions of this information were initially documented by Meyer Cory, CMA and reviewed by me for thoroughness and accuracy.      Lelon Huh, MD  [facility]

## 2018-08-07 NOTE — Patient Instructions (Signed)
.   Please review the attached list of medications and notify my office if there are any errors.   . Please bring all of your medications to every appointment so we can make sure that our medication list is the same as yours.   . We will have flu vaccines available after Labor Day. Please go to your pharmacy or call the office in early September to schedule you flu shot.   

## 2018-08-08 LAB — POTASSIUM: Potassium: 4.5 mmol/L (ref 3.5–5.2)

## 2018-08-10 ENCOUNTER — Telehealth: Payer: Self-pay

## 2018-08-10 NOTE — Telephone Encounter (Signed)
Pt advised.   Thanks,   -Laura  

## 2018-08-10 NOTE — Telephone Encounter (Signed)
-----   Message from Birdie Sons, MD sent at 08/08/2018  8:37 PM EDT ----- Potassium is very good. Can stay OFF of potassium the supplements. The spironolactone should keep the potassium levels up.

## 2018-08-11 ENCOUNTER — Other Ambulatory Visit: Payer: Self-pay | Admitting: Family Medicine

## 2018-08-11 MED ORDER — METFORMIN HCL 1000 MG PO TABS: 1000.0000 mg | ORAL_TABLET | Freq: Two times a day (BID) | ORAL | 4 refills | Status: DC

## 2018-08-11 NOTE — Telephone Encounter (Signed)
[place] faxed refill request for the following medications:  metFORMIN (GLUCOPHAGE) 1000 MG tablet   90 day supply Last Rx: 11/18/2016 Please advise. Thanks TNP

## 2018-08-31 ENCOUNTER — Other Ambulatory Visit: Payer: Self-pay | Admitting: Family Medicine

## 2018-09-01 ENCOUNTER — Telehealth: Payer: Self-pay | Admitting: Family Medicine

## 2018-09-01 NOTE — Chronic Care Management (AMB) (Signed)
Chronic Care Management   Note  09/01/2018 Name: Natalie Bray MRN: [id number] DOB: 1934-11-10  [name] is a 84 y.o. year old female who is a primary care patient of Fisher, Kirstie Peri, MD. I reached out to Natalie Bray by phone today in response to a referral sent by Natalie Bray's health plan.    Natalie Bray was given information about Chronic Care Management services today including:  1. CCM service includes personalized support from designated clinical staff supervised by her physician, including individualized plan of care and coordination with other care providers 2. 24/7 contact phone numbers for assistance for urgent and routine care needs. 3. Service will only be billed when office clinical staff spend 20 minutes or more in a month to coordinate care. 4. Only one practitioner may furnish and bill the service in a calendar month. 5. The patient may stop CCM services at any time (effective at the end of the month) by phone call to the office staff. 6. The patient will be responsible for cost sharing (co-pay) of up to 20% of the service fee (after annual deductible is met).  Patient agreed to services and verbal consent obtained.   Follow up plan: Telephone appointment with CCM team member scheduled for: 09/07/2018  [place]  ??[email or web address]   ??[phone]

## 2018-09-07 ENCOUNTER — Ambulatory Visit (INDEPENDENT_AMBULATORY_CARE_PROVIDER_SITE_OTHER): Payer: Medicare HMO | Admitting: Pharmacist

## 2018-09-07 DIAGNOSIS — I10 Essential (primary) hypertension: Secondary | ICD-10-CM | POA: Diagnosis not present

## 2018-09-07 DIAGNOSIS — E785 Hyperlipidemia, unspecified: Secondary | ICD-10-CM | POA: Diagnosis not present

## 2018-09-11 NOTE — Patient Instructions (Signed)
  Thank you allowing the Chronic Care Management Team to be a part of your care!   Please call a member of the CCM (Chronic Care Management) Team with any questions or case management needs:   Vanetta Mulders, BSN Nurse Care Coordinator  [phone]  Ruben Reason, PharmD  Clinical Pharmacist  [phone]  Elliot Gurney, LCSW Clinical Social Worker [phone]  Goals Addressed            This Visit's Progress   . "I'm doing well I think" (pt-stated)       Current Barriers:  . High pill burden . adherence  Pharmacist Clinical Goal(s):  Over the next 90 days, patient will continue to take all medications as prescribed, with adherence indicated by patient report and pharmacy fill history  Interventions: . Comprehensive medication review . Updated EMR medication list . Emphasized importance of high blood pressure and cholesterol medications . Counseled patient on checking BP and blood glucose at home using home monitors (available through [place])  Patient Self Care Activities:  . Self administers medications as prescribed . Attends all scheduled provider appointments . Calls pharmacy for medication refills . Calls provider office for new concerns or questions  Initial goal documentation        The patient verbalized understanding of instructions provided today and declined a print copy of patient instruction materials.

## 2018-09-11 NOTE — Chronic Care Management (AMB) (Signed)
Chronic Care Management   Follow Up Note   09/11/2018 - Late entry Name: Natalie Bray MRN: [id number] DOB: 12-11-1934  Subjective Natalie Bray is a 84 y.o. year old female who is a primary care patient of Fisher, Kirstie Peri, MD. The CCM pharmacist was consulted by patient's health plan. Initial pharmacy CCM appointment today conducted by telephone, HIPAA identifiers verified.    Assessment Review of patient status, including review of consultants reports, relevant laboratory and other test results, and collaboration with appropriate care team members and the patient's provider was performed as part of comprehensive patient evaluation and provision of chronic care management services.    Patient reports no financial strain in affording prescriptions. Aware of OTC benefit through [place] Encounter Medications as of 09/07/2018  Medication Sig  . Acetaminophen (TYLENOL ARTHRITIS PAIN PO) Take by mouth daily as needed.  Marland Kitchen amLODipine (NORVASC) 10 MG tablet TAKE 1 TABLET EVERY DAY  . aspirin 81 MG chewable tablet Chew 81 mg by mouth daily.  . Blood Glucose Monitoring Suppl (ACCU-CHEK AVIVA PLUS) w/Device KIT   . calcium-vitamin D (CALCIUM 500/D) 500-200 MG-UNIT per tablet Take 1 tablet by mouth daily.  . Cholecalciferol (VITAMIN D3) 1000 units CAPS Take by mouth.  . docusate sodium (COLACE) 100 MG capsule Take 100 mg by mouth daily.  Marland Kitchen glipiZIDE (GLUCOTROL XL) 5 MG 24 hr tablet Take 1 tablet (5 mg total) by mouth daily with breakfast.  . lisinopril-hydrochlorothiazide (PRINZIDE,ZESTORETIC) 20-12.5 MG tablet Take 2 tablets by mouth daily.  Marland Kitchen lovastatin (MEVACOR) 40 MG tablet TAKE 1 TABLET EVERY DAY  . metFORMIN (GLUCOPHAGE) 1000 MG tablet Take 1 tablet (1,000 mg total) by mouth 2 (two) times daily.  . Omega-3 1000 MG CAPS Take 1 capsule by mouth daily.   Marland Kitchen omeprazole (PRILOSEC) 20 MG capsule TAKE 1 CAPSULE EVERY DAY  . spironolactone (ALDACTONE) 25 MG tablet Take 1 tablet (25 mg  total) by mouth daily.  . vitamin E (E-400) 400 UNIT capsule Take 1 capsule by mouth daily.  . cloNIDine (CATAPRES) 0.1 MG tablet Take one tablet as needed for blood pressure over 180. May repeat in 30 minutes. Do not take more than 3 in a day (Patient not taking: Reported on 09/07/2018)  . [DISCONTINUED] ACCU-CHEK AVIVA PLUS test strip TEST BLOOD SUGAR EVERY DAY  . [DISCONTINUED] Nutritional Supplements (VITAMIN D MAINTENANCE) THERAPY PACK MISC Take by mouth.   No facility-administered encounter medications on file as of 09/07/2018.      Goals Addressed            This Visit's Progress   . "I'm doing well I think" (pt-stated)       Current Barriers:  . High pill burden . adherence  Pharmacist Clinical Goal(s):  Over the next 90 days, patient will continue to take all medications as prescribed, with adherence indicated by patient report and pharmacy fill history  Interventions: . Comprehensive medication review . Updated EMR medication list . Emphasized importance of high blood pressure and cholesterol medications . Counseled patient on checking BP and blood glucose at home using home monitors (available through [place])  Patient Self Care Activities:  . Self administers medications as prescribed . Attends all scheduled provider appointments . Calls pharmacy for medication refills . Calls provider office for new concerns or questions  Initial goal documentation         Telephone follow up appointment with care management team member scheduled for: 90 days  Ruben Reason, PharmD  [place] [phone]

## 2018-10-13 ENCOUNTER — Other Ambulatory Visit: Payer: Self-pay | Admitting: Family Medicine

## 2018-10-28 ENCOUNTER — Telehealth: Payer: Self-pay | Admitting: Family Medicine

## 2018-11-02 ENCOUNTER — Ambulatory Visit (INDEPENDENT_AMBULATORY_CARE_PROVIDER_SITE_OTHER): Payer: Medicare HMO | Admitting: Family Medicine

## 2018-11-02 ENCOUNTER — Encounter: Payer: Self-pay | Admitting: Family Medicine

## 2018-11-02 ENCOUNTER — Other Ambulatory Visit: Payer: Self-pay

## 2018-11-02 VITALS — BP 116/58 | HR 61 | Temp 97.1°F | Resp 16 | Wt 161.0 lb

## 2018-11-02 DIAGNOSIS — I10 Essential (primary) hypertension: Secondary | ICD-10-CM | POA: Diagnosis not present

## 2018-11-02 DIAGNOSIS — E876 Hypokalemia: Secondary | ICD-10-CM | POA: Diagnosis not present

## 2018-11-02 DIAGNOSIS — Z23 Encounter for immunization: Secondary | ICD-10-CM

## 2018-11-02 DIAGNOSIS — H9311 Tinnitus, right ear: Secondary | ICD-10-CM

## 2018-11-02 NOTE — Progress Notes (Signed)
Patient: Natalie Bray Female    DOB: 1934-09-05   84 y.o.   MRN: [id number] Visit Date: 11/02/2018  Today's Provider: Lelon Huh, MD   Chief Complaint  Patient presents with  . Tinnitus   Subjective:     HPI Tinnitus: Patient comes in complaining of a roaring sound in her right ear. This has occurred for the past year. Patient states the sound is constant and sound like her heartbeat. Patient denies any pain or decrease in hearing.   He is also due for follow up hyponatremia likely due to thiazide diuretics now changed to spirolactone which she is tolerating well.   Allergies  Allergen Reactions  . Celecoxib Rash and Other (See Comments)    Flushed red flushing Flushed red      Current Outpatient Medications:  .  Acetaminophen (TYLENOL ARTHRITIS PAIN PO), Take by mouth daily as needed., Disp: , Rfl:  .  amLODipine (NORVASC) 10 MG tablet, TAKE 1 TABLET EVERY DAY, Disp: 90 tablet, Rfl: 4 .  aspirin 81 MG chewable tablet, Chew 81 mg by mouth daily., Disp: , Rfl:  .  Blood Glucose Monitoring Suppl (ACCU-CHEK AVIVA PLUS) w/Device KIT, , Disp: , Rfl:  .  calcium-vitamin D (CALCIUM 500/D) 500-200 MG-UNIT per tablet, Take 1 tablet by mouth daily., Disp: , Rfl:  .  Cholecalciferol (VITAMIN D3) 1000 units CAPS, Take by mouth., Disp: , Rfl:  .  cloNIDine (CATAPRES) 0.1 MG tablet, Take one tablet as needed for blood pressure over 180. May repeat in 30 minutes. Do not take more than 3 in a day, Disp: 30 tablet, Rfl: 1 .  docusate sodium (COLACE) 100 MG capsule, Take 100 mg by mouth daily., Disp: , Rfl:  .  glipiZIDE (GLUCOTROL XL) 5 MG 24 hr tablet, TAKE 1 TABLET (5 MG TOTAL) BY MOUTH DAILY WITH BREAKFAST., Disp: 90 tablet, Rfl: 4 .  lisinopril-hydrochlorothiazide (PRINZIDE,ZESTORETIC) 20-12.5 MG tablet, Take 2 tablets by mouth daily., Disp: 180 tablet, Rfl: 4 .  lovastatin (MEVACOR) 40 MG tablet, TAKE 1 TABLET EVERY DAY, Disp: 90 tablet, Rfl: 4 .  metFORMIN (GLUCOPHAGE)  1000 MG tablet, Take 1 tablet (1,000 mg total) by mouth 2 (two) times daily., Disp: 180 tablet, Rfl: 4 .  Omega-3 1000 MG CAPS, Take 1 capsule by mouth daily. , Disp: , Rfl:  .  omeprazole (PRILOSEC) 20 MG capsule, TAKE 1 CAPSULE EVERY DAY, Disp: 90 capsule, Rfl: 4 .  spironolactone (ALDACTONE) 25 MG tablet, Take 1 tablet (25 mg total) by mouth daily., Disp: 90 tablet, Rfl: 3 .  vitamin E (E-400) 400 UNIT capsule, Take 1 capsule by mouth daily., Disp: , Rfl:   Review of Systems  Constitutional: Negative for appetite change, chills, fatigue and fever.  HENT: Positive for tinnitus.   Respiratory: Negative for chest tightness and shortness of breath.   Cardiovascular: Negative for chest pain and palpitations.  Gastrointestinal: Negative for abdominal pain, nausea and vomiting.  Neurological: Negative for dizziness and weakness.    Social History   Tobacco Use  . Smoking status: Never Smoker  . Smokeless tobacco: Never Used  Substance Use Topics  . Alcohol use: No    Alcohol/week: 0.0 standard drinks      Objective:   BP (!) 116/58 (Cuff Size: Large)   Pulse 61   Temp (!) 97.1 F (36.2 C) (Temporal)   Resp 16   Wt 161 lb (73 kg)   SpO2 98%   BMI 27.64 kg/m  Vitals:   11/02/18 0809 11/02/18 0816  BP: (!) 112/56 (!) 116/58  Pulse: 61   Resp: 16   Temp: (!) 97.1 F (36.2 C)   TempSrc: Temporal   SpO2: 98%   Weight: 161 lb (73 kg)   Body mass index is 27.64 kg/m.     Hearing Screening   '125Hz'  '250Hz'  '500Hz'  '1000Hz'  '2000Hz'  '3000Hz'  '4000Hz'  '6000Hz'  '8000Hz'   Right ear:   40 40 40  not heard    Left ear:   40 40 40  not heard     Physical Exam  General Appearance:    Well developed, well nourished female, alert, cooperative, in no acute distress  HENT:   Right ear canal and TM clear, Left ear canal with excessive cerumen  Eyes:    PERRL, conjunctiva/corneas clear, EOM's intact       Lungs:     Clear to auscultation bilaterally, respirations unlabored  Heart:    Normal heart  rate. Normal rhythm. No murmurs, rubs, or gallops.   Neurologic:   Awake, alert, oriented x 3. No apparent focal neurological           defect.          Assessment & Plan    1. Tinnitus of right ear  - Ambulatory referral to ENT  2. Hypokalemia Improved since change to spironolacton.  - Renal function panel  3. Need for influenza vaccination  - Flu Vaccine QUAD High Dose(Fluad)  4. Essential hypertension Well controlled.    The entirety of the information documented in the History of Present Illness, Review of Systems and Physical Exam were personally obtained by me. Portions of this information were initially documented by Meyer Cory, CMA and reviewed by me for thoroughness and accuracy.      Lelon Huh, MD  [facility]

## 2018-11-02 NOTE — Patient Instructions (Signed)
.   Please review the attached list of medications and notify my office if there are any errors.   . Please bring all of your medications to every appointment so we can make sure that our medication list is the same as yours.   . It is especially important to get the annual flu vaccine this year. If you haven't had it already, please go to your pharmacy or call the office as soon as possible to schedule you flu shot.  

## 2018-11-03 LAB — RENAL FUNCTION PANEL
Albumin: 4.4 g/dL (ref 3.6–4.6)
BUN/Creatinine Ratio: 26 (ref 12–28)
BUN: 24 mg/dL (ref 8–27)
CO2: 22 mmol/L (ref 20–29)
Calcium: 10.3 mg/dL (ref 8.7–10.3)
Chloride: 100 mmol/L (ref 96–106)
Creatinine, Ser: 0.91 mg/dL (ref 0.57–1.00)
GFR calc Af Amer: 68 mL/min/{1.73_m2} (ref >59)
GFR calc non Af Amer: 59 mL/min/{1.73_m2} — ABNORMAL LOW (ref >59)
Glucose: 122 mg/dL — ABNORMAL HIGH (ref 65–99)
Phosphorus: 3.5 mg/dL (ref 3.0–4.3)
Potassium: 4.5 mmol/L (ref 3.5–5.2)
Sodium: 136 mmol/L (ref 134–144)

## 2018-11-04 ENCOUNTER — Telehealth: Payer: Self-pay

## 2018-11-04 DIAGNOSIS — D485 Neoplasm of uncertain behavior of skin: Secondary | ICD-10-CM | POA: Diagnosis not present

## 2018-11-04 DIAGNOSIS — R208 Other disturbances of skin sensation: Secondary | ICD-10-CM | POA: Diagnosis not present

## 2018-11-04 DIAGNOSIS — Z85828 Personal history of other malignant neoplasm of skin: Secondary | ICD-10-CM | POA: Diagnosis not present

## 2018-11-04 DIAGNOSIS — L570 Actinic keratosis: Secondary | ICD-10-CM | POA: Diagnosis not present

## 2018-11-04 DIAGNOSIS — C44622 Squamous cell carcinoma of skin of right upper limb, including shoulder: Secondary | ICD-10-CM | POA: Diagnosis not present

## 2018-11-04 DIAGNOSIS — D2271 Melanocytic nevi of right lower limb, including hip: Secondary | ICD-10-CM | POA: Diagnosis not present

## 2018-11-04 DIAGNOSIS — D2261 Melanocytic nevi of right upper limb, including shoulder: Secondary | ICD-10-CM | POA: Diagnosis not present

## 2018-11-04 DIAGNOSIS — X32XXXA Exposure to sunlight, initial encounter: Secondary | ICD-10-CM | POA: Diagnosis not present

## 2018-11-04 DIAGNOSIS — D0439 Carcinoma in situ of skin of other parts of face: Secondary | ICD-10-CM | POA: Diagnosis not present

## 2018-11-04 DIAGNOSIS — D2262 Melanocytic nevi of left upper limb, including shoulder: Secondary | ICD-10-CM | POA: Diagnosis not present

## 2018-11-04 DIAGNOSIS — L821 Other seborrheic keratosis: Secondary | ICD-10-CM | POA: Diagnosis not present

## 2018-11-04 NOTE — Telephone Encounter (Signed)
Patient has been advised. KW 

## 2018-11-04 NOTE — Telephone Encounter (Signed)
-----   Message from Birdie Sons, MD sent at 11/03/2018  7:43 AM EDT ----- Potassium levels and kidney functions very good. Continue current medications.  Schedule follow up 3-4 months for diabetes and htn.

## 2018-11-12 ENCOUNTER — Other Ambulatory Visit: Payer: Self-pay | Admitting: Otolaryngology

## 2018-11-12 DIAGNOSIS — H903 Sensorineural hearing loss, bilateral: Secondary | ICD-10-CM | POA: Diagnosis not present

## 2018-11-12 DIAGNOSIS — H9311 Tinnitus, right ear: Secondary | ICD-10-CM | POA: Diagnosis not present

## 2018-11-12 DIAGNOSIS — H6123 Impacted cerumen, bilateral: Secondary | ICD-10-CM | POA: Diagnosis not present

## 2018-11-12 DIAGNOSIS — H93A1 Pulsatile tinnitus, right ear: Secondary | ICD-10-CM

## 2018-11-17 ENCOUNTER — Other Ambulatory Visit: Payer: Self-pay | Admitting: Family Medicine

## 2018-11-25 ENCOUNTER — Other Ambulatory Visit: Payer: Self-pay

## 2018-11-25 ENCOUNTER — Ambulatory Visit
Admission: RE | Admit: 2018-11-25 | Discharge: 2018-11-25 | Disposition: A | Discharge status: Home / Self Care | Payer: Medicare HMO | Source: Ambulatory Visit | Attending: Otolaryngology | Admitting: Otolaryngology

## 2018-11-25 DIAGNOSIS — I6621 Occlusion and stenosis of right posterior cerebral artery: Secondary | ICD-10-CM | POA: Diagnosis not present

## 2018-11-25 DIAGNOSIS — H93A1 Pulsatile tinnitus, right ear: Secondary | ICD-10-CM | POA: Diagnosis not present

## 2018-12-07 ENCOUNTER — Other Ambulatory Visit: Payer: Self-pay | Admitting: Family Medicine

## 2018-12-07 ENCOUNTER — Ambulatory Visit: Payer: Self-pay | Admitting: Pharmacist

## 2018-12-11 ENCOUNTER — Telehealth: Payer: Self-pay

## 2018-12-14 ENCOUNTER — Ambulatory Visit: Payer: Self-pay | Admitting: Pharmacist

## 2018-12-14 NOTE — Chronic Care Management (AMB) (Signed)
  Chronic Care Management   Note  12/14/2018 Name: Natalie Bray MRN: [id number] DOB: 1934/05/05  84 y.o. year old female referred to Chronic Care Management by her health plan. Today's outreach is 3 month CCM follow up. Last office visit with Birdie Sons, MD was 11/02/18.   Spoke with patient, HIPAA identifiers verified, but unfortunately she states she was "sitting down for lunch" and unable to speak to pharmacist at this time.  (unsuccessful outreach #1).  Follow up plan: The care management team will reach out to the patient again over the next 5-7 days.   Ruben Reason, PharmD Clinical Pharmacist [place] [phone]

## 2018-12-15 NOTE — Chronic Care Management (AMB) (Signed)
  Chronic Care Management   Note  12/15/2018 Name: Natalie Bray MRN: [id number] DOB: 08-13-1934  84 y.o. year old female referred to Chronic Care Management by her health plan. Today's outreach is 3 month CCM follow up. Last office visit with Birdie Sons, MD was 11/02/18.   Left HIPAA complaint voice message as patient was unavailable (unsuccessful outreach #2).  Follow up plan: The care management team will reach out to the patient again over the next 5-7 days.   Ruben Reason, PharmD Clinical Pharmacist [place] [phone]

## 2018-12-21 DIAGNOSIS — D0439 Carcinoma in situ of skin of other parts of face: Secondary | ICD-10-CM | POA: Diagnosis not present

## 2018-12-24 ENCOUNTER — Ambulatory Visit: Payer: Self-pay | Admitting: Pharmacist

## 2018-12-28 DIAGNOSIS — C44622 Squamous cell carcinoma of skin of right upper limb, including shoulder: Secondary | ICD-10-CM | POA: Diagnosis not present

## 2019-01-04 NOTE — Chronic Care Management (AMB) (Signed)
  Chronic Care Management   Note  01/04/2019 Name: Natalie Bray MRN: [id number] DOB: 1934-12-16  84 y.o. year old female referred to Chronic Care Management team for pharmacy services by her health plan. Today's outreach was for 3 month follow up.   CCM team services are being closed due to three unsuccessful outreach attempts.   Patient has been provided CCM contact information if he/she wishes to engage with care managers in the future.   Ruben Reason, PharmD Clinical Pharmacist [place] [phone]

## 2019-01-27 ENCOUNTER — Telehealth: Payer: Self-pay

## 2019-01-27 NOTE — Telephone Encounter (Signed)
Copied from [place] [phone]. Topic: General - Other >> Jan 27, 2019  4:01 PM Celene Kras wrote: Reason for CRM: Pt called and is requesting to  speak with Judson Roch and is needing to know if she needs a referral to see her eye doctor and foot doctor. Please advise.

## 2019-01-27 NOTE — Telephone Encounter (Signed)
I spoke with patient. She is going to check with her specialist  and find out if she needs a referral.

## 2019-03-05 ENCOUNTER — Ambulatory Visit (INDEPENDENT_AMBULATORY_CARE_PROVIDER_SITE_OTHER): Payer: Medicare HMO | Admitting: Physician Assistant

## 2019-03-05 ENCOUNTER — Other Ambulatory Visit: Payer: Self-pay

## 2019-03-05 ENCOUNTER — Encounter: Payer: Self-pay | Admitting: Physician Assistant

## 2019-03-05 VITALS — BP 147/72 | HR 90 | Temp 96.8°F | Wt 160.4 lb

## 2019-03-05 DIAGNOSIS — K5904 Chronic idiopathic constipation: Secondary | ICD-10-CM

## 2019-03-05 DIAGNOSIS — K649 Unspecified hemorrhoids: Secondary | ICD-10-CM

## 2019-03-05 MED ORDER — LUBIPROSTONE 8 MCG PO CAPS: 8.0000 ug | ORAL_CAPSULE | Freq: Two times a day (BID) | ORAL | 0 refills | Status: DC

## 2019-03-05 MED ORDER — HYDROCORTISONE ACETATE 25 MG RE SUPP: 25.0000 mg | Freq: Two times a day (BID) | RECTAL | 0 refills | Status: DC

## 2019-03-05 NOTE — Patient Instructions (Signed)

## 2019-03-05 NOTE — Progress Notes (Signed)
Patient: Natalie Bray Female    DOB: 13-Aug-1934   85 y.o.   MRN: [id number] Visit Date: 03/05/2019  Today's Provider: Trinna Post, PA-C   Chief Complaint  Patient presents with  . Hemorrhoids   Subjective:     HPI   Patient presents today for hemorrhoids. Last night she had straining bowel movements and noticed some mass coming out of her rectum. Small amount of blood on tissue when she wiped. Patient says she feels like she has to go to the bathroom and doesn't know if its coming from the hemorrhoid or if she really has to go. Says she first noticed the mass last night. Patient takes colace and metamucil for constipation and says that its not working for her. Miralax did not help her. Reports there is a dollar store liquid laxative, the name of which she does not recall, which helps her.   Allergies  Allergen Reactions  . Celecoxib Rash and Other (See Comments)    Flushed red flushing Flushed red      Current Outpatient Medications:  .  Acetaminophen (TYLENOL ARTHRITIS PAIN PO), Take by mouth daily as needed., Disp: , Rfl:  .  amLODipine (NORVASC) 10 MG tablet, TAKE 1 TABLET EVERY DAY, Disp: 90 tablet, Rfl: 1 .  aspirin 81 MG chewable tablet, Chew 81 mg by mouth daily., Disp: , Rfl:  .  Blood Glucose Monitoring Suppl (ACCU-CHEK AVIVA PLUS) w/Device KIT, , Disp: , Rfl:  .  calcium-vitamin D (CALCIUM 500/D) 500-200 MG-UNIT per tablet, Take 1 tablet by mouth daily., Disp: , Rfl:  .  Cholecalciferol (VITAMIN D3) 1000 units CAPS, Take by mouth., Disp: , Rfl:  .  cloNIDine (CATAPRES) 0.1 MG tablet, Take one tablet as needed for blood pressure over 180. May repeat in 30 minutes. Do not take more than 3 in a day, Disp: 30 tablet, Rfl: 1 .  docusate sodium (COLACE) 100 MG capsule, Take 100 mg by mouth daily., Disp: , Rfl:  .  glipiZIDE (GLUCOTROL XL) 5 MG 24 hr tablet, TAKE 1 TABLET (5 MG TOTAL) BY MOUTH DAILY WITH BREAKFAST., Disp: 90 tablet, Rfl: 4 .   lisinopril-hydrochlorothiazide (ZESTORETIC) 20-12.5 MG tablet, TAKE 2 TABLETS EVERY DAY, Disp: 180 tablet, Rfl: 4 .  lovastatin (MEVACOR) 40 MG tablet, TAKE 1 TABLET EVERY DAY, Disp: 90 tablet, Rfl: 1 .  metFORMIN (GLUCOPHAGE) 1000 MG tablet, Take 1 tablet (1,000 mg total) by mouth 2 (two) times daily., Disp: 180 tablet, Rfl: 4 .  Omega-3 1000 MG CAPS, Take 1 capsule by mouth daily. , Disp: , Rfl:  .  omeprazole (PRILOSEC) 20 MG capsule, TAKE 1 CAPSULE EVERY DAY, Disp: 90 capsule, Rfl: 1 .  spironolactone (ALDACTONE) 25 MG tablet, Take 1 tablet (25 mg total) by mouth daily., Disp: 90 tablet, Rfl: 3 .  vitamin E (E-400) 400 UNIT capsule, Take 1 capsule by mouth daily., Disp: , Rfl:   Review of Systems  Social History   Tobacco Use  . Smoking status: Never Smoker  . Smokeless tobacco: Never Used  Substance Use Topics  . Alcohol use: No    Alcohol/week: 0.0 standard drinks      Objective:   There were no vitals taken for this visit. There were no vitals filed for this visit.There is no height or weight on file to calculate BMI.   Physical Exam Constitutional:      Appearance: Normal appearance.  Genitourinary:    Rectum: Normal. No mass, anal  fissure or external hemorrhoid.  Neurological:     Mental Status: She is alert.      No results found for any visits on 03/05/19.     Assessment & Plan    1. Constipation idiopathic constipation  Will try amitiza as below. May have had prolapsed hemorrhoid or prolapsed rectum but rectal exam looks quite normal today. Counseled in either event preventing constipation will be key. If the issues worsen, counseled she may have to see a GI specialist and in that case she would like to see [facility].  - lubiprostone (AMITIZA) 8 MCG capsule; Take 1 capsule (8 mcg total) by mouth 2 (two) times daily with a meal.  Dispense: 180 capsule; Refill: 0  2. Hemorrhoids, unspecified hemorrhoid type  - hydrocortisone (ANUSOL-HC) 25 MG  suppository; Place 1 suppository (25 mg total) rectally 2 (two) times daily.  Dispense: 12 suppository; Refill: 0  The entirety of the information documented in the History of Present Illness, Review of Systems and Physical Exam were personally obtained by me. Portions of this information were initially documented by Elonda Husky, CMA and reviewed by me for thoroughness and accuracy.       Trinna Post, PA-C  [facility]

## 2019-03-15 DIAGNOSIS — E119 Type 2 diabetes mellitus without complications: Secondary | ICD-10-CM | POA: Diagnosis not present

## 2019-03-15 DIAGNOSIS — H353131 Nonexudative age-related macular degeneration, bilateral, early dry stage: Secondary | ICD-10-CM | POA: Diagnosis not present

## 2019-03-15 DIAGNOSIS — H04123 Dry eye syndrome of bilateral lacrimal glands: Secondary | ICD-10-CM | POA: Diagnosis not present

## 2019-05-05 ENCOUNTER — Encounter: Payer: Self-pay | Admitting: Adult Health

## 2019-05-05 NOTE — Progress Notes (Signed)
Virtual telephone visit     Virtual Visit via Telephone Note   This visit type was conducted due to national recommendations for restrictions regarding the COVID-19 Pandemic (e.g. social distancing) in an effort to limit this patient's exposure and mitigate transmission in our community. Due to her co-morbid illnesses, this patient is at least at moderate risk for complications without adequate follow up. This format is felt to be most appropriate for this patient at this time. The patient did not have access to video technology or had technical difficulties with video requiring transitioning to audio format only (telephone). Physical exam was limited to content and character of the telephone converstion.    Patient location: at home  Provider location: Provider: Provider's office at  [facility], [place].      Patient: Natalie Bray   DOB: 1934-03-13   85 y.o. Female  MRN: [id number] Visit Date: 05/05/2019  Today's Provider: Marcille Buffy, FNP  Subjective:    Chief Complaint  Patient presents with  . Cough   Cough This is a new problem. The current episode started 1 to 4 weeks ago. The cough is non-productive. Associated symptoms include rhinorrhea. Pertinent negatives include no chest pain, chills, ear congestion, ear pain, fever, headaches, heartburn, hemoptysis, myalgias, nasal congestion, postnasal drip, sore throat, shortness of breath, sweats, weight loss or wheezing. Nothing aggravates the symptoms. She has tried OTC cough suppressant for the symptoms. The treatment provided no relief. Her past medical history is significant for environmental allergies (pollen ).   She has had these symptoms for around  2 weeks ago since pollen was high.   Her husband passed away 3 weeks ago. She is coping well.   She reports that she is having nasal allergies. She is taking Zytrec once daily. She started this in the last week and denies it having a  decongestant in it.   She reports she has a mild post nasal drip cough that is aggravating her she reports she is coughing 1- 3 times throughout the day/ night and she wanted to know if anything would help the post nasal drip. She reports the congestion is clear and only in my nose/ throat and much worse when I am outside.  She denies any chest congestion or chest pain.  She denies any sinus pain. She does have sinus pressure.   She denies any glaucoma.  She had cataracts removed previously.   Denies any edema.  Denies any known illness exposure or contact.   She states besides my allergy symptoms " I feel fine"  Patient  denies any fever, body aches,chills, rash, chest pain, shortness of breath, nausea, vomiting, or diarrhea.      Patient Active Problem List   Diagnosis Date Noted  . Inguinal hernia of right side without obstruction or gangrene 09/16/2017  . Microalbuminuria 10/15/2016  . Osteoporosis 03/14/2016  . Degenerative disc disease, lumbar 08/08/2014  . Hyponatremia 08/08/2014  . Hypokalemia 08/08/2014  . Varicose vein 08/08/2014  . Allergic rhinitis 08/08/2014  . GERD (gastroesophageal reflux disease) 08/08/2014  . Osteoarthritis 08/08/2014  . Type 2 diabetes mellitus with diabetic chronic kidney disease ([place]) 03/08/2013  . Hypertension 03/08/2013  . Hyperlipidemia 03/08/2013  . Post menopausal syndrome 03/08/2013   Past Medical History:  Diagnosis Date  . Anemia   . Anemia, iron deficiency 08/08/2014  . Diabetes mellitus without complication ([place])   . History of chicken pox   . History of skin cancer   .  Hyperlipidemia   . Hypertension    Allergies  Allergen Reactions  . Celecoxib Rash and Other (See Comments)    Flushed red flushing Flushed red       Medications: Outpatient Medications Prior to Visit  Medication Sig  . Acetaminophen (TYLENOL ARTHRITIS PAIN PO) Take by mouth daily as needed.  Marland Kitchen amLODipine (NORVASC) 10 MG tablet TAKE 1 TABLET EVERY  DAY  . aspirin 81 MG chewable tablet Chew 81 mg by mouth daily.  . Blood Glucose Monitoring Suppl (ACCU-CHEK AVIVA PLUS) w/Device KIT   . calcium-vitamin D (CALCIUM 500/D) 500-200 MG-UNIT per tablet Take 1 tablet by mouth daily.  . Cholecalciferol (VITAMIN D3) 1000 units CAPS Take by mouth.  . cloNIDine (CATAPRES) 0.1 MG tablet Take one tablet as needed for blood pressure over 180. May repeat in 30 minutes. Do not take more than 3 in a day  . docusate sodium (COLACE) 100 MG capsule Take 100 mg by mouth daily.  Marland Kitchen glipiZIDE (GLUCOTROL XL) 5 MG 24 hr tablet TAKE 1 TABLET (5 MG TOTAL) BY MOUTH DAILY WITH BREAKFAST.  . hydrocortisone (ANUSOL-HC) 25 MG suppository Place 1 suppository (25 mg total) rectally 2 (two) times daily.  Marland Kitchen lisinopril-hydrochlorothiazide (ZESTORETIC) 20-12.5 MG tablet TAKE 2 TABLETS EVERY DAY  . lovastatin (MEVACOR) 40 MG tablet TAKE 1 TABLET EVERY DAY  . lubiprostone (AMITIZA) 8 MCG capsule Take 1 capsule (8 mcg total) by mouth 2 (two) times daily with a meal.  . metFORMIN (GLUCOPHAGE) 1000 MG tablet Take 1 tablet (1,000 mg total) by mouth 2 (two) times daily.  . Omega-3 1000 MG CAPS Take 1 capsule by mouth daily.   Marland Kitchen omeprazole (PRILOSEC) 20 MG capsule TAKE 1 CAPSULE EVERY DAY  . spironolactone (ALDACTONE) 25 MG tablet Take 1 tablet (25 mg total) by mouth daily.  . vitamin E (E-400) 400 UNIT capsule Take 1 capsule by mouth daily.   No facility-administered medications prior to visit.    Review of Systems  Constitutional: Negative for activity change, appetite change, chills, diaphoresis, fatigue, fever, unexpected weight change and weight loss.  HENT: Positive for rhinorrhea and sinus pressure. Negative for ear pain, postnasal drip, sinus pain and sore throat.   Respiratory: Positive for cough (occasional. ). Negative for apnea, hemoptysis, choking, chest tightness, shortness of breath, wheezing and stridor.   Cardiovascular: Negative.  Negative for chest pain,  palpitations and leg swelling.  Gastrointestinal: Negative.  Negative for heartburn.  Genitourinary: Negative.   Musculoskeletal: Negative for myalgias.  Allergic/Immunologic: Positive for environmental allergies (pollen ).  Neurological: Negative for headaches.  Psychiatric/Behavioral: Negative.       Objective:   No vital signs available.   Telephone visit only.  Patient is alert and oriented and responsive to questions Engages in conversation with provider. Speaks in full sentences without any pauses without any shortness of breath or distress.      Assessment & Plan:     Seasonal allergic rhinitis due to pollen  Post-nasal drip  Cough in adult  Nasal discharge with watery eyes   Meds ordered this encounter  Medications  . fluticasone (FLONASE) 50 MCG/ACT nasal spray    Sig: Place 1 spray into both nostrils daily.    Dispense:  16 g    Refill:  1  . sodium chloride (OCEAN) 0.65 % SOLN nasal spray    Sig: Place 1 spray into both nostrils as needed for congestion (nasal rinse).    Dispense:  482 mL    Refill:  0  Avoid decongestants with high blood pressure. Coricidin HBP per package instructions is  ok if cough bothersome.      Discussed side effects of medication prescribed and go to use. Flonase PRN. Rinsing off in shower after being exposed to pollen.  Let us know if any symptoms changing or worsening.  If signs of  Sinus infection discussed  develop call office.   Discussed Covid protocol and testing as recommended per guidelines Advised patient call the office or your primary care doctor for an appointment if no improvement within 72 hours or if any symptoms change or worsen at any time  Advised ER or urgent Care if after hours or on weekend. Call 911 for emergency symptoms at any time.Patinet verbalized understanding of all instructions given/reviewed and treatment plan and has no further questions or concerns at this time.     I discussed the assessment  and treatment plan with the patient. The patient was provided an opportunity to ask questions and all were answered. The patient agreed with the plan and demonstrated an understanding of the instructions.   The patient was advised to call back or seek an in-person evaluation if the symptoms worsen or if the condition fails to improve as anticipated.  I provided 25 minutes of non-face-to-face time during this encounter.  The entirety of the information documented in the History of Present Illness, Review of Systems and Physical Exam were personally obtained by me. Portions of this information were initially documented by the  Certified Medical Assistant whose name is documented in [place] and reviewed by me for thoroughness and accuracy.  I have personally performed the exam and reviewed the chart and it is accurate to the best of my knowledge.  Haematologist has been used and any errors in dictation or transcription are unintentional.  Kelby Aline. [place], [place] [phone] (phone) [phone] (fax)  [place]

## 2019-05-06 ENCOUNTER — Encounter: Payer: Self-pay | Admitting: Adult Health

## 2019-05-06 ENCOUNTER — Ambulatory Visit (INDEPENDENT_AMBULATORY_CARE_PROVIDER_SITE_OTHER): Payer: Medicare HMO | Admitting: Adult Health

## 2019-05-06 DIAGNOSIS — J301 Allergic rhinitis due to pollen: Secondary | ICD-10-CM | POA: Diagnosis not present

## 2019-05-06 DIAGNOSIS — R05 Cough: Secondary | ICD-10-CM

## 2019-05-06 DIAGNOSIS — R0982 Postnasal drip: Secondary | ICD-10-CM

## 2019-05-06 DIAGNOSIS — J3489 Other specified disorders of nose and nasal sinuses: Secondary | ICD-10-CM | POA: Diagnosis not present

## 2019-05-06 DIAGNOSIS — H04209 Unspecified epiphora, unspecified lacrimal gland: Secondary | ICD-10-CM

## 2019-05-06 DIAGNOSIS — R059 Cough, unspecified: Secondary | ICD-10-CM | POA: Insufficient documentation

## 2019-05-06 MED ORDER — FLUTICASONE PROPIONATE 50 MCG/ACT NA SUSP: 1.0000 | Freq: Every day | NASAL | 1 refills | Status: DC

## 2019-05-06 MED ORDER — SALINE SPRAY 0.65 % NA SOLN: 1.0000 | NASAL | 0 refills | Status: DC | PRN

## 2019-05-06 NOTE — Patient Instructions (Signed)
Allergic Rhinitis, Adult Allergic rhinitis is a reaction to allergens in the air. Allergens are tiny specks (particles) in the air that cause your body to have an allergic reaction. This condition cannot be passed from person to person (is not contagious). Allergic rhinitis cannot be cured, but it can be controlled. There are two types of allergic rhinitis:  Seasonal. This type is also called hay fever. It happens only during certain times of the year.  Perennial. This type can happen at any time of the year. What are the causes? This condition may be caused by:  Pollen from grasses, trees, and weeds.  House dust mites.  Pet dander.  Mold. What are the signs or symptoms? Symptoms of this condition include:  Sneezing.  Runny or stuffy nose (nasal congestion).  A lot of mucus in the back of the throat (postnasal drip).  Itchy nose.  Tearing of the eyes.  Trouble sleeping.  Being sleepy during day. How is this treated? There is no cure for this condition. You should avoid things that trigger your symptoms (allergens). Treatment can help to relieve symptoms. This may include:  Medicines that block allergy symptoms, such as antihistamines. These may be given as a shot, nasal spray, or pill.  Shots that are given until your body becomes less sensitive to the allergen (desensitization).  Stronger medicines, if all other treatments have not worked. Follow these instructions at home: Avoiding allergens   Find out what you are allergic to. Common allergens include smoke, dust, and pollen.  Avoid them if you can. These are some of the things that you can do to avoid allergens: ? Replace carpet with wood, tile, or vinyl flooring. Carpet can trap dander and dust. ? Clean any mold found in the home. ? Do not smoke. Do not allow smoking in your home. ? Change your heating and air conditioning filter at least once a month. ? During allergy season:  Keep windows closed as much as  you can. If possible, use air conditioning when there is a lot of pollen in the air.  Use a special filter for allergies with your furnace and air conditioner.  Plan outdoor activities when pollen counts are lowest. This is usually during the early morning or evening hours.  If you do go outdoors when pollen count is high, wear a special mask for people with allergies.  When you come indoors, take a shower and change your clothes before sitting on furniture or bedding. General instructions  Do not use fans in your home.  Do not hang clothes outside to dry.  Wear sunglasses to keep pollen out of your eyes.  Wash your hands right away after you touch household pets.  Take over-the-counter and prescription medicines only as told by your doctor.  Keep all follow-up visits as told by your doctor. This is important. Contact a doctor if:  You have a fever.  You have a cough that does not go away (is persistent).  You start to make whistling sounds when you breathe (wheeze).  Your symptoms do not get better with treatment.  You have thick fluid coming from your nose.  You start to have nosebleeds. Get help right away if:  Your tongue or your lips are swollen.  You have trouble breathing.  You feel dizzy or you feel like you are going to pass out (faint).  You have cold sweats. Summary  Allergic rhinitis is a reaction to allergens in the air.  This condition may be   caused by allergens. These include pollen, dust mites, pet dander, and mold.  Symptoms include a runny, itchy nose, sneezing, or tearing eyes. You may also have trouble sleeping or feel sleepy during the day.  Treatment includes taking medicines and avoiding allergens. You may also get shots or take stronger medicines.  Get help if you have a fever or a cough that does not stop. Get help right away if you are short of breath. This information is not intended to replace advice given to you by your health care  provider. Make sure you discuss any questions you have with your health care provider. Document Revised: 04/28/2018 Document Reviewed: 07/29/2017 Elsevier Patient Education  [place]. Allergic Rhinitis, Adult Allergic rhinitis is a reaction to allergens in the air. Allergens are tiny specks (particles) in the air that cause your body to have an allergic reaction. This condition cannot be passed from person to person (is not contagious). Allergic rhinitis cannot be cured, but it can be controlled. There are two types of allergic rhinitis:  Seasonal. This type is also called hay fever. It happens only during certain times of the year.  Perennial. This type can happen at any time of the year. What are the causes? This condition may be caused by:  Pollen from grasses, trees, and weeds.  House dust mites.  Pet dander.  Mold. What are the signs or symptoms? Symptoms of this condition include:  Sneezing.  Runny or stuffy nose (nasal congestion).  A lot of mucus in the back of the throat (postnasal drip).  Itchy nose.  Tearing of the eyes.  Trouble sleeping.  Being sleepy during day. How is this treated? There is no cure for this condition. You should avoid things that trigger your symptoms (allergens). Treatment can help to relieve symptoms. This may include:  Medicines that block allergy symptoms, such as antihistamines. These may be given as a shot, nasal spray, or pill.  Shots that are given until your body becomes less sensitive to the allergen (desensitization).  Stronger medicines, if all other treatments have not worked. Follow these instructions at home: Avoiding allergens   Find out what you are allergic to. Common allergens include smoke, dust, and pollen.  Avoid them if you can. These are some of the things that you can do to avoid allergens: ? Replace carpet with wood, tile, or vinyl flooring. Carpet can trap dander and dust. ? Clean any mold found  in the home. ? Do not smoke. Do not allow smoking in your home. ? Change your heating and air conditioning filter at least once a month. ? During allergy season:  Keep windows closed as much as you can. If possible, use air conditioning when there is a lot of pollen in the air.  Use a special filter for allergies with your furnace and air conditioner.  Plan outdoor activities when pollen counts are lowest. This is usually during the early morning or evening hours.  If you do go outdoors when pollen count is high, wear a special mask for people with allergies.  When you come indoors, take a shower and change your clothes before sitting on furniture or bedding. General instructions  Do not use fans in your home.  Do not hang clothes outside to dry.  Wear sunglasses to keep pollen out of your eyes.  Wash your hands right away after you touch household pets.  Take over-the-counter and prescription medicines only as told by your doctor.  Keep all follow-up  visits as told by your doctor. This is important. Contact a doctor if:  You have a fever.  You have a cough that does not go away (is persistent).  You start to make whistling sounds when you breathe (wheeze).  Your symptoms do not get better with treatment.  You have thick fluid coming from your nose.  You start to have nosebleeds. Get help right away if:  Your tongue or your lips are swollen.  You have trouble breathing.  You feel dizzy or you feel like you are going to pass out (faint).  You have cold sweats. Summary  Allergic rhinitis is a reaction to allergens in the air.  This condition may be caused by allergens. These include pollen, dust mites, pet dander, and mold.  Symptoms include a runny, itchy nose, sneezing, or tearing eyes. You may also have trouble sleeping or feel sleepy during the day.  Treatment includes taking medicines and avoiding allergens. You may also get shots or take stronger  medicines.  Get help if you have a fever or a cough that does not stop. Get help right away if you are short of breath. This information is not intended to replace advice given to you by your health care provider. Make sure you discuss any questions you have with your health care provider. Document Revised: 04/28/2018 Document Reviewed: 07/29/2017 Elsevier Patient Education  [place].

## 2019-05-12 DIAGNOSIS — D2261 Melanocytic nevi of right upper limb, including shoulder: Secondary | ICD-10-CM | POA: Diagnosis not present

## 2019-05-12 DIAGNOSIS — D225 Melanocytic nevi of trunk: Secondary | ICD-10-CM | POA: Diagnosis not present

## 2019-05-12 DIAGNOSIS — Z85828 Personal history of other malignant neoplasm of skin: Secondary | ICD-10-CM | POA: Diagnosis not present

## 2019-05-12 DIAGNOSIS — D2271 Melanocytic nevi of right lower limb, including hip: Secondary | ICD-10-CM | POA: Diagnosis not present

## 2019-05-12 DIAGNOSIS — C44622 Squamous cell carcinoma of skin of right upper limb, including shoulder: Secondary | ICD-10-CM | POA: Diagnosis not present

## 2019-05-12 DIAGNOSIS — L570 Actinic keratosis: Secondary | ICD-10-CM | POA: Diagnosis not present

## 2019-05-12 DIAGNOSIS — D485 Neoplasm of uncertain behavior of skin: Secondary | ICD-10-CM | POA: Diagnosis not present

## 2019-05-12 DIAGNOSIS — D2262 Melanocytic nevi of left upper limb, including shoulder: Secondary | ICD-10-CM | POA: Diagnosis not present

## 2019-05-19 ENCOUNTER — Telehealth: Payer: Self-pay | Admitting: Family Medicine

## 2019-05-19 NOTE — Chronic Care Management (AMB) (Signed)
  Care Management   Note  05/19/2019 Name: Natalie Bray MRN: [id number] DOB: 11/22/1934  Natalie Bray is a 85 y.o. year old female who is a primary care patient of Caryn Section, Kirstie Peri, MD and is actively engaged with the care management team. I reached out to Gust Brooms by phone today to assist with scheduling an initial visit with the Pharmacist  Follow up plan: Patient declines further follow up and engagement by the care management team. Appropriate care team members and provider have been notified via electronic communication.   Noreene Larsson, [place], [place], [place] [address] Direct Dial: [phone] [email or web address] Website: [email or web address]

## 2019-05-26 NOTE — Progress Notes (Signed)
Established patient visit   Patient: Natalie Bray   DOB: 1934-11-17   85 y.o. Female  MRN: [id number] Visit Date: 05/28/2019  Today's healthcare provider: Lelon Huh, MD   Chief Complaint  Patient presents with  . Diabetes  . Anxiety  . Hypertension   Subjective    HPI Diabetes Mellitus Type II, Follow-up  Lab Results  Component Value Date   HGBA1C 6.6 (A) 06/24/2018   HGBA1C 6.6 (A) 02/04/2018   HGBA1C 6.3 (A) 09/23/2017   Last seen for diabetes 11 months ago.  Management since then includes continuing same medication. She reports good compliance with treatment. She is not having side effects.  Symptoms: No fatigue No foot ulcerations No appetite changes No nausea No paresthesia (numbness or tingling) of the feet  No polydipsia (excessive thirst) No polyuria (frequent urination) No visual disturbances  No vomiting  Home blood sugar records: fasting range: 120-200  Episodes of hypoglycemia? No    Current insulin regiment: none Most Recent Eye Exam: 03/15/2019 [facility]) Current exercise: none Current diet habits: well balanced  Pertinent Labs: Lab Results  Component Value Date   CHOL 190 02/04/2018   HDL 55 02/04/2018   LDLCALC 72 02/04/2018   TRIG 317 (H) 02/04/2018   CHOLHDL 3.5 02/04/2018   Lab Results  Component Value Date   NA 136 11/02/2018   K 4.5 11/02/2018   CO2 22 11/02/2018   GLUCOSE 122 (H) 11/02/2018   BUN 24 11/02/2018   CREATININE 0.91 11/02/2018   CALCIUM 10.3 11/02/2018   GFRNONAA 59 (L) 11/02/2018   GFRAA 68 11/02/2018     Wt Readings from Last 3 Encounters:  05/28/19 157 lb (71.2 kg)  03/05/19 160 lb 6.4 oz (72.8 kg)  11/02/18 161 lb (73 kg)    --------------------------------------------------------------------------------------------------- Anxiety:  She feels her anxiety is mild and Worse since her husband passed away on 05/07/2019.  Symptoms: No chest pain No difficulty concentrating No  dizziness No fatigue No feelings of losing control Yes insomnia No irritable No palpitations No panic attacks No racing thoughts No shortness of breath No sweating No tremors/shakes  GAD-7 Results No flowsheet data found.  PHQ-9 Scores PHQ9 SCORE ONLY 11/02/2018 08/28/2017 06/12/2016  Score 0 0 0    ---------------------------------------------------------------------------------------------------  Hypertension, follow-up  BP Readings from Last 3 Encounters:  05/28/19 128/71  03/05/19 (!) 147/72  11/02/18 (!) 116/58   Wt Readings from Last 3 Encounters:  05/28/19 157 lb (71.2 kg)  03/05/19 160 lb 6.4 oz (72.8 kg)  11/02/18 161 lb (73 kg)     She was last seen for hypertension 6 months ago.  BP at that visit was 116/58. Management since that visit includes continuing same medication.  She reports good compliance with treatment. She is not having side effects.  She is following a Regular diet. She is not exercising. She does not smoke.  Use of agents associated with hypertension: NSAIDS.   Outside blood pressures are 140-150/60. Symptoms: No chest pain No chest pressure No palpitations No dyspnea No orthopnea No paroxysmal nocturnal dyspnea No lower extremity edema No syncope   Pertinent labs: Lab Results  Component Value Date   CHOL 190 02/04/2018   HDL 55 02/04/2018   LDLCALC 72 02/04/2018   TRIG 317 (H) 02/04/2018   CHOLHDL 3.5 02/04/2018   Lab Results  Component Value Date   NA 136 11/02/2018   K 4.5 11/02/2018   CO2 22 11/02/2018   GLUCOSE 122 (H)  11/02/2018   BUN 24 11/02/2018   CREATININE 0.91 11/02/2018   CALCIUM 10.3 11/02/2018   GFRNONAA 59 (L) 11/02/2018   GFRAA 68 11/02/2018     The ASCVD Risk score Mikey Bussing DC Jr., et al., 2013) failed to calculate for the following reasons:   The 2013 ASCVD risk score is only valid for ages 84 to 4    ---------------------------------------------------------------------------------------------------     Medications: Outpatient Medications Prior to Visit  Medication Sig  . ACCU-CHEK AVIVA PLUS test strip   . Acetaminophen (TYLENOL ARTHRITIS PAIN PO) Take by mouth daily as needed.  Marland Kitchen amLODipine (NORVASC) 10 MG tablet TAKE 1 TABLET EVERY DAY  . aspirin 81 MG chewable tablet Chew 81 mg by mouth daily.  . Blood Glucose Monitoring Suppl (ACCU-CHEK AVIVA PLUS) w/Device KIT   . calcium-vitamin D (CALCIUM 500/D) 500-200 MG-UNIT per tablet Take 1 tablet by mouth daily.  . Cholecalciferol (VITAMIN D3) 1000 units CAPS Take by mouth.  . cloNIDine (CATAPRES) 0.1 MG tablet Take one tablet as needed for blood pressure over 180. May repeat in 30 minutes. Do not take more than 3 in a day  . docusate sodium (COLACE) 100 MG capsule Take 100 mg by mouth daily.  Marland Kitchen glipiZIDE (GLUCOTROL XL) 5 MG 24 hr tablet TAKE 1 TABLET (5 MG TOTAL) BY MOUTH DAILY WITH BREAKFAST.  Marland Kitchen lisinopril-hydrochlorothiazide (ZESTORETIC) 20-12.5 MG tablet TAKE 2 TABLETS EVERY DAY  . lovastatin (MEVACOR) 40 MG tablet TAKE 1 TABLET EVERY DAY  . metFORMIN (GLUCOPHAGE) 1000 MG tablet Take 1 tablet (1,000 mg total) by mouth 2 (two) times daily.  . Omega-3 1000 MG CAPS Take 1 capsule by mouth daily.   Marland Kitchen omeprazole (PRILOSEC) 20 MG capsule TAKE 1 CAPSULE EVERY DAY  . spironolactone (ALDACTONE) 25 MG tablet Take 1 tablet (25 mg total) by mouth daily.  . vitamin E (E-400) 400 UNIT capsule Take 1 capsule by mouth daily.  . [DISCONTINUED] fluticasone (FLONASE) 50 MCG/ACT nasal spray Place 1 spray into both nostrils daily. (Patient not taking: Reported on 05/28/2019)  . [DISCONTINUED] hydrocortisone (ANUSOL-HC) 25 MG suppository Place 1 suppository (25 mg total) rectally 2 (two) times daily. (Patient not taking: Reported on 05/28/2019)  . [DISCONTINUED] sodium chloride (OCEAN) 0.65 % SOLN nasal spray Place 1 spray into both nostrils as needed for  congestion (nasal rinse). (Patient not taking: Reported on 05/28/2019)   No facility-administered medications prior to visit.    Review of Systems  Constitutional: Negative for appetite change, chills, fatigue and fever.  Respiratory: Negative for chest tightness and shortness of breath.   Cardiovascular: Negative for chest pain and palpitations.  Gastrointestinal: Negative for abdominal pain, nausea and vomiting.  Neurological: Negative for dizziness and weakness.  Psychiatric/Behavioral: Positive for sleep disturbance (trouble falling asleep). The patient is nervous/anxious.        Frequent crying       Objective    BP 128/71 (BP Location: Left Arm, Patient Position: Sitting, Cuff Size: Normal)   Pulse 96   Temp (!) 97.5 F (36.4 C) (Temporal)   Resp 18   Wt 157 lb (71.2 kg)   SpO2 98% Comment: room air  BMI 26.95 kg/m    Physical Exam  General appearance:  Well developed, well nourished female, cooperative and in no acute distress Head: Normocephalic, without obvious abnormality, atraumatic Respiratory: Respirations even and unlabored, normal respiratory rate Extremities: All extremities are intact.  Skin: Skin color, texture, turgor normal. No rashes seen  Psych: Appropriate mood and affect. Neurologic:  Mental status: Alert, oriented to person, place, and time, thought content appropriate.   Results for orders placed or performed in visit on 05/28/19  POCT HgB A1C  Result Value Ref Range   Hemoglobin A1C 6.5 (A) 4.0 - 5.6 %   Est. average glucose Bld gHb Est-mCnc 140     Assessment & Plan     1. Type 2 diabetes mellitus with stage 1 chronic kidney disease, without long-term current use of insulin (HCC) Well controlled. Continue current medications.   - Comprehensive metabolic panel  2. Situational stress Secondary to recent unexpected passing of her husband. Start- FLUoxetine (PROZAC) 10 MG capsule; Take 1 capsule (10 mg total) by mouth daily.  Dispense: 30  capsule; Refill: 1  3. Psychophysiological insomnia Expect improvement with SSRI, may continue tylenol PM prn.   4. Essential hypertension Stable Continue current medications.   - TSH  5. Hyperlipidemia, unspecified hyperlipidemia type She is tolerating lovastatin well with no adverse effects.   - Lipid panel - CBC - TSH   Return in about 1 month (around 06/28/2019).      The entirety of the information documented in the History of Present Illness, Review of Systems and Physical Exam were personally obtained by me. Portions of this information were initially documented by the CMA and reviewed by me for thoroughness and accuracy.      Lelon Huh, MD  [facility] [phone] (phone) [phone] (fax)  [place]

## 2019-05-28 ENCOUNTER — Ambulatory Visit (INDEPENDENT_AMBULATORY_CARE_PROVIDER_SITE_OTHER): Payer: Medicare HMO | Admitting: Family Medicine

## 2019-05-28 ENCOUNTER — Other Ambulatory Visit: Payer: Self-pay

## 2019-05-28 ENCOUNTER — Encounter: Payer: Self-pay | Admitting: Family Medicine

## 2019-05-28 VITALS — BP 128/71 | HR 96 | Temp 97.5°F | Resp 18 | Wt 157.0 lb

## 2019-05-28 DIAGNOSIS — E785 Hyperlipidemia, unspecified: Secondary | ICD-10-CM

## 2019-05-28 DIAGNOSIS — F439 Reaction to severe stress, unspecified: Secondary | ICD-10-CM

## 2019-05-28 DIAGNOSIS — F5104 Psychophysiologic insomnia: Secondary | ICD-10-CM | POA: Diagnosis not present

## 2019-05-28 DIAGNOSIS — I10 Essential (primary) hypertension: Secondary | ICD-10-CM

## 2019-05-28 DIAGNOSIS — N181 Chronic kidney disease, stage 1: Secondary | ICD-10-CM

## 2019-05-28 DIAGNOSIS — E1122 Type 2 diabetes mellitus with diabetic chronic kidney disease: Secondary | ICD-10-CM | POA: Diagnosis not present

## 2019-05-28 LAB — POCT GLYCOSYLATED HEMOGLOBIN (HGB A1C)
Est. average glucose Bld gHb Est-mCnc: 140
Hemoglobin A1C: 6.5 % — AB (ref 4.0–5.6)

## 2019-05-28 MED ORDER — FLUOXETINE HCL 10 MG PO CAPS: 10.0000 mg | ORAL_CAPSULE | Freq: Every day | ORAL | 1 refills | Status: DC

## 2019-05-28 NOTE — Patient Instructions (Signed)
   Start taking fluoxetine 10mg  one capsule every morning for two weeks, then increase to two capsules every morning

## 2019-05-29 LAB — COMPREHENSIVE METABOLIC PANEL
ALT: 15 IU/L (ref 0–32)
AST: 16 IU/L (ref 0–40)
Albumin/Globulin Ratio: 1.6 (ref 1.2–2.2)
Albumin: 4.6 g/dL (ref 3.6–4.6)
Alkaline Phosphatase: 80 IU/L (ref 39–117)
BUN/Creatinine Ratio: 18 (ref 12–28)
BUN: 16 mg/dL (ref 8–27)
Bilirubin Total: 0.2 mg/dL (ref 0.0–1.2)
CO2: 21 mmol/L (ref 20–29)
Calcium: 10.7 mg/dL — ABNORMAL HIGH (ref 8.7–10.3)
Chloride: 99 mmol/L (ref 96–106)
Creatinine, Ser: 0.9 mg/dL (ref 0.57–1.00)
GFR calc Af Amer: 69 mL/min/{1.73_m2} (ref >59)
GFR calc non Af Amer: 60 mL/min/{1.73_m2} (ref >59)
Globulin, Total: 2.9 g/dL (ref 1.5–4.5)
Glucose: 123 mg/dL — ABNORMAL HIGH (ref 65–99)
Potassium: 4.7 mmol/L (ref 3.5–5.2)
Sodium: 138 mmol/L (ref 134–144)
Total Protein: 7.5 g/dL (ref 6.0–8.5)

## 2019-05-29 LAB — CBC
Hematocrit: 36.7 % (ref 34.0–46.6)
Hemoglobin: 11.6 g/dL (ref 11.1–15.9)
MCH: 26.4 pg — ABNORMAL LOW (ref 26.6–33.0)
MCHC: 31.6 g/dL (ref 31.5–35.7)
MCV: 84 fL (ref 79–97)
Platelets: 440 10*3/uL (ref 150–450)
RBC: 4.39 x10E6/uL (ref 3.77–5.28)
RDW: 14.3 % (ref 11.7–15.4)
WBC: 5.6 10*3/uL (ref 3.4–10.8)

## 2019-05-29 LAB — LIPID PANEL
Chol/HDL Ratio: 3.3 ratio (ref 0.0–4.4)
Cholesterol, Total: 184 mg/dL (ref 100–199)
HDL: 55 mg/dL (ref >39)
LDL Chol Calc (NIH): 81 mg/dL (ref 0–99)
Triglycerides: 293 mg/dL — ABNORMAL HIGH (ref 0–149)
VLDL Cholesterol Cal: 48 mg/dL — ABNORMAL HIGH (ref 5–40)

## 2019-05-29 LAB — TSH: TSH: 1.74 u[IU]/mL (ref 0.450–4.500)

## 2019-05-31 ENCOUNTER — Telehealth: Payer: Self-pay

## 2019-05-31 NOTE — Telephone Encounter (Signed)
-----   Message from Birdie Sons, MD sent at 05/30/2019  7:46 AM EDT ----- Labs are all good. Continue current medications.  Follow up in June as scheduled.

## 2019-05-31 NOTE — Telephone Encounter (Signed)
Patient advised.

## 2019-06-09 DIAGNOSIS — C44622 Squamous cell carcinoma of skin of right upper limb, including shoulder: Secondary | ICD-10-CM | POA: Diagnosis not present

## 2019-06-09 DIAGNOSIS — D0461 Carcinoma in situ of skin of right upper limb, including shoulder: Secondary | ICD-10-CM | POA: Diagnosis not present

## 2019-06-14 ENCOUNTER — Encounter: Payer: Self-pay | Admitting: Family Medicine

## 2019-06-14 ENCOUNTER — Ambulatory Visit (INDEPENDENT_AMBULATORY_CARE_PROVIDER_SITE_OTHER): Payer: Medicare HMO | Admitting: Family Medicine

## 2019-06-14 DIAGNOSIS — I10 Essential (primary) hypertension: Secondary | ICD-10-CM

## 2019-06-14 DIAGNOSIS — R05 Cough: Secondary | ICD-10-CM | POA: Diagnosis not present

## 2019-06-14 DIAGNOSIS — R059 Cough, unspecified: Secondary | ICD-10-CM

## 2019-06-14 MED ORDER — VALSARTAN-HYDROCHLOROTHIAZIDE 160-25 MG PO TABS: 1.0000 | ORAL_TABLET | Freq: Every day | ORAL | 1 refills | Status: DC

## 2019-06-14 MED ORDER — VALSARTAN-HYDROCHLOROTHIAZIDE 160-12.5 MG PO TABS: 1.0000 | ORAL_TABLET | Freq: Every day | ORAL | 1 refills | Status: DC

## 2019-06-14 NOTE — Progress Notes (Signed)
Virtual telephone visit    Virtual Visit via Telephone Note   This visit type was conducted due to national recommendations for restrictions regarding the COVID-19 Pandemic (e.g. social distancing) in an effort to limit this patient's exposure and mitigate transmission in our community. Due to her co-morbid illnesses, this patient is at least at moderate risk for complications without adequate follow up. This format is felt to be most appropriate for this patient at this time. The patient did not have access to video technology or had technical difficulties with video requiring transitioning to audio format only (telephone). Physical exam was limited to content and character of the telephone converstion.    Patient location: home  Provider location: [facility]   Visit Date: 06/14/2019  Today's healthcare provider: Lelon Huh, MD   Chief Complaint  Patient presents with  . Cough   I,Latasha Walston,acting as a scribe for Lelon Huh, MD.,have documented all relevant documentation on the behalf of Lelon Huh, MD,as directed by  Lelon Huh, MD while in the presence of Lelon Huh, MD.\ Subjective    Cough This is a new problem. Episode onset: a couple months ago. The problem has been unchanged. The problem occurs every few hours. The cough is non-productive. Pertinent negatives include no fever. The symptoms are aggravated by lying down. She has tried OTC cough suppressant for the symptoms. The treatment provided no relief.  No nasal drainage. No shortness of breath. Tried several allergy meds without relief. No heartburn or sore throat.     Medications: Outpatient Medications Prior to Visit  Medication Sig  . ACCU-CHEK AVIVA PLUS test strip   . Acetaminophen (TYLENOL ARTHRITIS PAIN PO) Take by mouth daily as needed.  Marland Kitchen amLODipine (NORVASC) 10 MG tablet TAKE 1 TABLET EVERY DAY  . aspirin 81 MG chewable tablet Chew 81 mg by mouth daily.  . Blood Glucose Monitoring Suppl  (ACCU-CHEK AVIVA PLUS) w/Device KIT   . calcium-vitamin D (CALCIUM 500/D) 500-200 MG-UNIT per tablet Take 1 tablet by mouth daily.  . Cholecalciferol (VITAMIN D3) 1000 units CAPS Take by mouth.  . cloNIDine (CATAPRES) 0.1 MG tablet Take one tablet as needed for blood pressure over 180. May repeat in 30 minutes. Do not take more than 3 in a day  . docusate sodium (COLACE) 100 MG capsule Take 100 mg by mouth daily.  Marland Kitchen FLUoxetine (PROZAC) 10 MG capsule Take 1 capsule (10 mg total) by mouth daily.  Marland Kitchen glipiZIDE (GLUCOTROL XL) 5 MG 24 hr tablet TAKE 1 TABLET (5 MG TOTAL) BY MOUTH DAILY WITH BREAKFAST.  Marland Kitchen lisinopril-hydrochlorothiazide (ZESTORETIC) 20-12.5 MG tablet TAKE 2 TABLETS EVERY DAY  . lovastatin (MEVACOR) 40 MG tablet TAKE 1 TABLET EVERY DAY  . metFORMIN (GLUCOPHAGE) 1000 MG tablet Take 1 tablet (1,000 mg total) by mouth 2 (two) times daily.  . Omega-3 1000 MG CAPS Take 1 capsule by mouth daily.   Marland Kitchen omeprazole (PRILOSEC) 20 MG capsule TAKE 1 CAPSULE EVERY DAY  . spironolactone (ALDACTONE) 25 MG tablet Take 1 tablet (25 mg total) by mouth daily.  . vitamin E (E-400) 400 UNIT capsule Take 1 capsule by mouth daily.   No facility-administered medications prior to visit.    Review of Systems  Constitutional: Negative for fever.  Respiratory: Positive for cough.        Objective    There were no vitals taken for this visit.   Awake, alert, oriented x 3. In no apparent distress     Assessment & Plan  1. Cough History is not c/w allergies or infections. Suspect ACEI cough. Change to ARB as below   2. Essential hypertension Stop lisinopril-hctz (2 x 20/12.5 mg daily) and start- valsartan-hydrochlorothiazide (DIOVAN HCT) 160-25 MG tablet; Take 1 tablet by mouth daily.  Dispense: 30 tablet; Refill: 1   No follow-ups on file.    I discussed the assessment and treatment plan with the patient. The patient was provided an opportunity to ask questions and all were answered. The  patient agreed with the plan and demonstrated an understanding of the instructions.   The patient was advised to call back or seek an in-person evaluation if the symptoms worsen or if the condition fails to improve as anticipated.  I provided 8 minutes of non-face-to-face time during this encounter.  The entirety of the information documented in the History of Present Illness, Review of Systems and Physical Exam were personally obtained by me. Portions of this information were initially documented by the CMA and reviewed by me for thoroughness and accuracy.     Lelon Huh, MD [facility] [phone] (phone) [phone] (fax)  [place]

## 2019-06-14 NOTE — Progress Notes (Signed)
I,Laura E Walsh,acting as a scribe for Lelon Huh, MD.,have documented all relevant documentation on the behalf of Lelon Huh, MD,as directed by  Lelon Huh, MD while in the presence of Lelon Huh, MD.   Established patient visit   Patient: Natalie Bray   DOB: 11/11/1934   85 y.o. Female  MRN: [id number] Visit Date: 06/25/2019  Today's healthcare provider: Lelon Huh, MD   Chief Complaint  Patient presents with  . Cough  . Hypertension  . Insomnia  . Anxiety   Subjective    HPI  Hypertension, follow-up  BP Readings from Last 3 Encounters:  06/25/19 128/60  05/28/19 128/71  03/05/19 (!) 147/72   Wt Readings from Last 3 Encounters:  06/25/19 161 lb (73 kg)  05/28/19 157 lb (71.2 kg)  03/05/19 160 lb 6.4 oz (72.8 kg)     She was last seen for hypertension 2 weeks ago via virtual visit   Management since that visit includes stoping Lisinopril-hctz and starting- Valsartan-hydrochlorothiazide (DIOVAN HCT) 160-25 MG tablet.  She reports excellent compliance with treatment. She is not having side effects.  She is following a Low Sodium diet. She is not exercising. She does not smoke.  Use of agents associated with hypertension: NSAIDS.   Outside blood pressures are 140-160's/60-70's. Symptoms: No chest pain No chest pressure  No palpitations No syncope  No dyspnea No orthopnea  No paroxysmal nocturnal dyspnea No lower extremity edema   Pertinent labs: Lab Results  Component Value Date   CHOL 184 05/28/2019   HDL 55 05/28/2019   LDLCALC 81 05/28/2019   TRIG 293 (H) 05/28/2019   CHOLHDL 3.3 05/28/2019   Lab Results  Component Value Date   NA 138 05/28/2019   K 4.7 05/28/2019   CREATININE 0.90 05/28/2019   GFRNONAA 60 05/28/2019   GFRAA 69 05/28/2019   GLUCOSE 123 (H) 05/28/2019     The ASCVD Risk score (Goff DC Jr., et al., 2013) failed to calculate for the following reasons:   The 2013 ASCVD risk score is only valid for ages 7 to  65   ---------------------------------------------------------------------------------------------------  Situational stress, Follow-up  She  was last seen for this 4 weeks ago. Changes made at last visit include starting Prozac 90m daily.   She reports excellent compliance with treatment. She is not having side effects.   She reports excellent tolerance of treatment. Current symptoms include: insomnia She feels she is Improved since last visit.  [facility] 11/02/2018 08/28/2017 06/12/2016  Decreased Interest 0 0 0  Down, Depressed, Hopeless 0 0 0  PHQ - 2 Score 0 0 0  Altered sleeping - - 0  Tired, decreased energy - - 0  Change in appetite - - 0  Feeling bad or failure about yourself  - - 0  Trouble concentrating - - 0  Moving slowly or fidgety/restless - - 0  Suicidal thoughts - - 0  PHQ-9 Score - - 0  Difficult doing work/chores - - Not difficult at all    ----------------------------------------------------------------------------------------- Follow up for Insomnia:  The patient was last seen for this 4 weeks ago. Changes made at last visit include starting SSRI to help with anxiety/ stress;  may continue tylenol PM prn.  She reports excellent compliance with treatment. She feels that condition is Improved. She is not having side effects.   ----------------------------------------------------------------------------------------- Follow up for cough  The patient was last seen for this 2 weeks ago vie telephone visit and was  though to be secondary to ACEI. Changes made at last visit include changing lisinopril-hctz 40/25 daily to valsartan-hctz 160/41m daily.  She reports excellent compliance with treatment. She feels that condition is Unchanged. She is not having side effects.   -----------------------------------------------------------------------------------------     Medications: Outpatient Medications Prior to Visit  Medication Sig  .  ACCU-CHEK AVIVA PLUS test strip   . Acetaminophen (TYLENOL ARTHRITIS PAIN PO) Take by mouth daily as needed.  .Marland KitchenamLODipine (NORVASC) 10 MG tablet TAKE 1 TABLET EVERY DAY  . aspirin 81 MG chewable tablet Chew 81 mg by mouth daily.  . Blood Glucose Monitoring Suppl (ACCU-CHEK AVIVA PLUS) w/Device KIT   . calcium-vitamin D (CALCIUM 500/D) 500-200 MG-UNIT per tablet Take 1 tablet by mouth daily.  . Cholecalciferol (VITAMIN D3) 1000 units CAPS Take by mouth.  . cloNIDine (CATAPRES) 0.1 MG tablet Take one tablet as needed for blood pressure over 180. May repeat in 30 minutes. Do not take more than 3 in a day  . docusate sodium (COLACE) 100 MG capsule Take 100 mg by mouth daily.  .Marland KitchenFLUoxetine (PROZAC) 10 MG capsule Take 1 capsule (10 mg total) by mouth daily.  .Marland KitchenglipiZIDE (GLUCOTROL XL) 5 MG 24 hr tablet TAKE 1 TABLET (5 MG TOTAL) BY MOUTH DAILY WITH BREAKFAST.  .Marland Kitchenlovastatin (MEVACOR) 40 MG tablet TAKE 1 TABLET EVERY DAY  . metFORMIN (GLUCOPHAGE) 1000 MG tablet Take 1 tablet (1,000 mg total) by mouth 2 (two) times daily.  . Omega-3 1000 MG CAPS Take 1 capsule by mouth daily.   .Marland Kitchenomeprazole (PRILOSEC) 20 MG capsule TAKE 1 CAPSULE EVERY DAY  . spironolactone (ALDACTONE) 25 MG tablet Take 1 tablet (25 mg total) by mouth daily.  . valsartan-hydrochlorothiazide (DIOVAN HCT) 160-25 MG tablet Take 1 tablet by mouth daily.  . vitamin E (E-400) 400 UNIT capsule Take 1 capsule by mouth daily.   No facility-administered medications prior to visit.    Review of Systems  Constitutional: Negative.  Negative for appetite change, chills, fatigue and fever.  HENT: Negative for congestion, ear discharge, ear pain, hearing loss, nosebleeds, postnasal drip, rhinorrhea, sinus pressure, sinus pain, sneezing, sore throat, trouble swallowing and voice change.   Eyes: Negative.   Respiratory: Positive for cough. Negative for apnea, choking, chest tightness, shortness of breath, wheezing and stridor.   Cardiovascular:  Negative for chest pain and palpitations.  Gastrointestinal: Negative.  Negative for abdominal pain, nausea and vomiting.  Neurological: Negative for dizziness, weakness, light-headedness and headaches.  Psychiatric/Behavioral: Positive for sleep disturbance. Negative for behavioral problems, decreased concentration, dysphoric mood, self-injury and suicidal ideas. The patient is not nervous/anxious.       Objective    BP 128/60 (BP Location: Left Arm, Patient Position: Sitting, Cuff Size: Large)   Pulse 75   Temp (!) 97.1 F (36.2 C) (Temporal)   Resp (!) 96   Wt 161 lb (73 kg)   BMI 27.64 kg/m    Physical Exam Constitutional:      Appearance: Normal appearance.  Cardiovascular:     Rate and Rhythm: Normal rate and regular rhythm.  Pulmonary:     Effort: Pulmonary effort is normal.     Breath sounds: Normal breath sounds.  Neurological:     Mental Status: She is alert and oriented to person, place, and time. Mental status is at baseline.  Psychiatric:        Mood and Affect: Mood normal.        Thought Content: Thought content normal.  No results found for any visits on 06/25/19.  Assessment & Plan     Problem List Items Addressed This Visit      Cardiovascular and Mediastinum   Hypertension - Primary    Well controlled  Pt is tolerating valsartan/HCTZ 160/51m. Pt is still having trouble with cough.  Continue current medications F/u in 3 months        Musculoskeletal and Integument   Osteoporosis   Relevant Orders   DG Bone Density [place]     Other   Cough in adult    Chronic and uncontrolled. Still coughing, but somewhat improved since stopping Lisinopril/HCTZ Pt denies shortness of breath, or wheezing. Likely allergy related, will try Singulair 197monce a day.  Recheck in 3 months.  Call if worsening or not improved.        Situational stress    Well controlled.  Pt reports improvement with the addition of Fluoxetine 1050mContinue  current medications.      Psychophysiological insomnia    Chronic and uncontrolled.  Will try trazodone 100m67m bedtime.  Recheck in 3 months.           Return in about 3 months (around 09/25/2019).      The entirety of the information documented in the History of Present Illness, Review of Systems and Physical Exam were personally obtained by me. Portions of this information were initially documented by the CMA and reviewed by me for thoroughness and accuracy.      DonaLelon Huh  [facility] [address][phone]) [phone])  [place]

## 2019-06-25 ENCOUNTER — Ambulatory Visit (INDEPENDENT_AMBULATORY_CARE_PROVIDER_SITE_OTHER): Payer: Medicare HMO | Admitting: Family Medicine

## 2019-06-25 ENCOUNTER — Encounter: Payer: Self-pay | Admitting: Family Medicine

## 2019-06-25 ENCOUNTER — Other Ambulatory Visit: Payer: Self-pay

## 2019-06-25 VITALS — BP 128/60 | HR 75 | Temp 97.1°F | Resp 96 | Wt 161.0 lb

## 2019-06-25 DIAGNOSIS — R05 Cough: Secondary | ICD-10-CM | POA: Diagnosis not present

## 2019-06-25 DIAGNOSIS — F439 Reaction to severe stress, unspecified: Secondary | ICD-10-CM

## 2019-06-25 DIAGNOSIS — F5104 Psychophysiologic insomnia: Secondary | ICD-10-CM | POA: Diagnosis not present

## 2019-06-25 DIAGNOSIS — I10 Essential (primary) hypertension: Secondary | ICD-10-CM | POA: Diagnosis not present

## 2019-06-25 DIAGNOSIS — R059 Cough, unspecified: Secondary | ICD-10-CM

## 2019-06-25 DIAGNOSIS — M810 Age-related osteoporosis without current pathological fracture: Secondary | ICD-10-CM

## 2019-06-25 MED ORDER — VALSARTAN-HYDROCHLOROTHIAZIDE 160-25 MG PO TABS: 1.0000 | ORAL_TABLET | Freq: Every day | ORAL | 1 refills | Status: DC

## 2019-06-25 MED ORDER — TRAZODONE HCL 100 MG PO TABS: 100.0000 mg | ORAL_TABLET | Freq: Every day | ORAL | 2 refills | Status: DC

## 2019-06-25 MED ORDER — FLUOXETINE HCL 10 MG PO CAPS: 10.0000 mg | ORAL_CAPSULE | Freq: Every day | ORAL | 2 refills | Status: DC

## 2019-06-25 MED ORDER — MONTELUKAST SODIUM 10 MG PO TABS: 10.0000 mg | ORAL_TABLET | Freq: Every day | ORAL | 2 refills | Status: DC

## 2019-06-25 NOTE — Assessment & Plan Note (Signed)
Well controlled.  Pt reports improvement with the addition of Fluoxetine 10mg . Continue current medications.

## 2019-06-25 NOTE — Progress Notes (Deleted)
   Established patient visit   Patient: Natalie Bray   DOB: 05/21/1934   85 y.o. Female  MRN: [id number] Visit Date: 09/29/2019  Today's healthcare provider: Donald Fisher, MD   No chief complaint on file.  Subjective    Follow up for chronic cough  The patient was last seen for this  June 4th . Changes made at last visit include adding montelukast. Had previously changed lisinopril to valsartan with minimal improvement..  She reports {excellent/good/fair/poor:19665} compliance with treatment. She feels that condition is {improved/worse/unchanged:3041574}. She {is/is not:21021397} having side effects. ***  -----------------------------------------------------------------------------------------  Follow up for insomnia  The patient was last seen for this June 4th. Changes made at last visit include trial of trazodone 100mg hs.  She reports {excellent/good/fair/poor:19665} compliance with treatment. She feels that condition is {improved/worse/unchanged:3041574}. She {is/is not:21021397} having side effects. ***  -----------------------------------------------------------------------------------------   Depression, Follow-up  She  was last seen for this  On June 4th. Changes made at last visit include continuing fluoxetine 10mg which had been initiated the month before. .   She reports {excellent/good/fair/poor:19665} compliance with treatment. She {is/is not:21021397} having side effects. ***  She reports {DESC; GOOD/FAIR/POOR:18685} tolerance of treatment. Current symptoms include: {Symptoms; depression:1002} She feels she is {improved/worse/unchanged:3041574} since last visit.  Depression screen PHQ 2/9 11/02/2018 08/28/2017 06/12/2016  Decreased Interest 0 0 0  Down, Depressed, Hopeless 0 0 0  PHQ - 2 Score 0 0 0  Altered sleeping - - 0  Tired, decreased energy - - 0  Change in appetite - - 0  Feeling bad or failure about yourself  - - 0  Trouble  concentrating - - 0  Moving slowly or fidgety/restless - - 0  Suicidal thoughts - - 0  PHQ-9 Score - - 0  Difficult doing work/chores - - Not difficult at all    -----------------------------------------------------------------------------------------  Diabetes Mellitus Type II, Follow-up  Lab Results  Component Value Date   HGBA1C 6.5 (A) 05/28/2019   HGBA1C 6.6 (A) 06/24/2018   HGBA1C 6.6 (A) 02/04/2018   Wt Readings from Last 3 Encounters:  06/25/19 161 lb (73 kg)  05/28/19 157 lb (71.2 kg)  03/05/19 160 lb 6.4 oz (72.8 kg)   Last seen for diabetes 4 months ago.  Management since then includes continue the same medication regiment.  She reports {excellent/good/fair/poor:19665} compliance with treatment. She {is/is not:21021397} having side effects. {document side effects if present:1} Symptoms: {Yes/No:20286} fatigue {Yes/No:20286} foot ulcerations  {Yes/No:20286} appetite changes {Yes/No:20286} nausea  {Yes/No:20286} paresthesia of the feet  {Yes/No:20286} polydipsia  {Yes/No:20286} polyuria {Yes/No:20286} visual disturbances   {Yes/No:20286} vomiting     Home blood sugar records: {diabetes glucometry results:16657}  Episodes of hypoglycemia? {Yes/No:20286} {enter symptoms and frequency of symptoms if yes:1}   Current insulin regiment: {enter 'none' or type of insulin and number of units taken with each dose of each insulin formulation that the patient is taking:1} Most Recent Eye Exam: *** {Current exercise:16438:::1} {Current diet habits:16563:::1}  Pertinent Labs: Lab Results  Component Value Date   CHOL 184 05/28/2019   HDL 55 05/28/2019   LDLCALC 81 05/28/2019   TRIG 293 (H) 05/28/2019   CHOLHDL 3.3 05/28/2019   Lab Results  Component Value Date   NA 138 05/28/2019   K 4.7 05/28/2019   CREATININE 0.90 05/28/2019   GFRNONAA 60 05/28/2019   GFRAA 69 05/28/2019   GLUCOSE 123 (H) 05/28/2019      ---------------------------------------------------------------------------------------------------   {Show patient history (optional):23778::" "}   Medications: Outpatient   Medications Prior to Visit  Medication Sig  . ACCU-CHEK AVIVA PLUS test strip   . Acetaminophen (TYLENOL ARTHRITIS PAIN PO) Take by mouth daily as needed.  Marland Kitchen amLODipine (NORVASC) 10 MG tablet TAKE 1 TABLET EVERY DAY  . aspirin 81 MG chewable tablet Chew 81 mg by mouth daily.  . Blood Glucose Monitoring Suppl (ACCU-CHEK AVIVA PLUS) w/Device KIT   . calcium-vitamin D (CALCIUM 500/D) 500-200 MG-UNIT per tablet Take 1 tablet by mouth daily.  . Cholecalciferol (VITAMIN D3) 1000 units CAPS Take by mouth.  . cloNIDine (CATAPRES) 0.1 MG tablet Take one tablet as needed for blood pressure over 180. May repeat in 30 minutes. Do not take more than 3 in a day  . docusate sodium (COLACE) 100 MG capsule Take 100 mg by mouth daily.  Marland Kitchen FLUoxetine (PROZAC) 10 MG capsule Take 1 capsule (10 mg total) by mouth daily.  Marland Kitchen glipiZIDE (GLUCOTROL XL) 5 MG 24 hr tablet TAKE 1 TABLET (5 MG TOTAL) BY MOUTH DAILY WITH BREAKFAST.  Marland Kitchen lovastatin (MEVACOR) 40 MG tablet TAKE 1 TABLET EVERY DAY  . metFORMIN (GLUCOPHAGE) 1000 MG tablet Take 1 tablet (1,000 mg total) by mouth 2 (two) times daily.  . montelukast (SINGULAIR) 10 MG tablet Take 1 tablet (10 mg total) by mouth at bedtime.  . Omega-3 1000 MG CAPS Take 1 capsule by mouth daily.   Marland Kitchen omeprazole (PRILOSEC) 20 MG capsule TAKE 1 CAPSULE EVERY DAY  . spironolactone (ALDACTONE) 25 MG tablet Take 1 tablet (25 mg total) by mouth daily.  . traZODone (DESYREL) 100 MG tablet Take 1 tablet (100 mg total) by mouth at bedtime.  . valsartan-hydrochlorothiazide (DIOVAN HCT) 160-25 MG tablet Take 1 tablet by mouth daily.  . vitamin E (E-400) 400 UNIT capsule Take 1 capsule by mouth daily.   No facility-administered medications prior to visit.    Review of Systems  {Heme  Chem  Endocrine  Serology   Results Review (optional):23779::" "}  Objective    There were no vitals taken for this visit. {Show previous vital signs (optional):23777::" "}  Physical Exam  ***  No results found for any visits on 09/29/19.  Assessment & Plan     ***  No follow-ups on file.      {provider attestation***:1}   Lelon Huh, MD  [facility] [phone] (phone) [phone] (fax)  [place]

## 2019-06-25 NOTE — Patient Instructions (Signed)

## 2019-06-25 NOTE — Assessment & Plan Note (Addendum)
Chronic and uncontrolled.  Will try trazodone 100mg  at bedtime.  Recheck in 3 months.

## 2019-06-25 NOTE — Assessment & Plan Note (Addendum)
Chronic and uncontrolled. Still coughing since stopping Lisinopril/HCTZ Pt denies shortness of breath, or wheezing. Likely allergy related, will try Singulair 10mg  once a day.  Recheck in 3 months.  Call if worsening or not improved.

## 2019-06-25 NOTE — Assessment & Plan Note (Addendum)
Well controlled  Pt is tolerating valsartan/HCTZ 160/25mg . Pt is still having trouble with cough.  Continue current medications F/u in 3 months

## 2019-07-05 ENCOUNTER — Telehealth: Payer: Self-pay

## 2019-07-05 ENCOUNTER — Ambulatory Visit
Admission: RE | Admit: 2019-07-05 | Discharge: 2019-07-05 | Disposition: A | Discharge status: Home / Self Care | Payer: Medicare HMO | Attending: Family Medicine | Admitting: Family Medicine

## 2019-07-05 ENCOUNTER — Ambulatory Visit: Payer: Self-pay

## 2019-07-05 ENCOUNTER — Other Ambulatory Visit: Payer: Self-pay

## 2019-07-05 ENCOUNTER — Ambulatory Visit
Admission: RE | Admit: 2019-07-05 | Discharge: 2019-07-05 | Disposition: A | Discharge status: Home / Self Care | Payer: Medicare HMO | Source: Ambulatory Visit | Attending: Family Medicine | Admitting: Family Medicine

## 2019-07-05 DIAGNOSIS — M2140 Flat foot [pes planus] (acquired), unspecified foot: Secondary | ICD-10-CM

## 2019-07-05 DIAGNOSIS — J984 Other disorders of lung: Secondary | ICD-10-CM | POA: Diagnosis not present

## 2019-07-05 DIAGNOSIS — R059 Cough, unspecified: Secondary | ICD-10-CM

## 2019-07-05 DIAGNOSIS — R05 Cough: Secondary | ICD-10-CM | POA: Insufficient documentation

## 2019-07-05 DIAGNOSIS — M722 Plantar fascial fibromatosis: Secondary | ICD-10-CM

## 2019-07-05 NOTE — Addendum Note (Signed)
Addended by: Jules Schick on: 07/05/2019 02:02 PM   Modules accepted: Orders

## 2019-07-05 NOTE — Telephone Encounter (Signed)
Attempted to contact patient, no answer left voicemail. Need more information. Why she is requesting referral. PEC triage please get more info from patient when she returns call.

## 2019-07-05 NOTE — Telephone Encounter (Signed)
Have place order for chest xray, also recommend she double up on omeprazole and take one tablet twice a day. Sometimes reflux can cause a cough.

## 2019-07-05 NOTE — Telephone Encounter (Signed)
Patient returned call from office.  Patient had request a referral for her cough. Triage was completed.  She states that she saw Dr Caryn Section 06/25/19. He prescribed a cough medication for her persistent dry cough.  She states that today her cough is no better.  She states she cough more at nigh than in the day.  She has no other symptoms. No fever. She has been vaccinated for COVID-19. She was calling to request a CXR.  Call placed to [place] at office. Notes will be forwarded to office. Patient aware.  Reason for Disposition  Cough has been present for > 3 weeks  Answer Assessment - Initial Assessment Questions 1. ONSET: "When did the cough begin?"      4 months  2. SEVERITY: "How bad is the cough today?"      No better 3. RESPIRATORY DISTRESS: "Describe your breathing."      Not any SOB 4. FEVER: "Do you have a fever?" If so, ask: "What is your temperature, how was it measured, and when did it start?"    no 5. HEMOPTYSIS: "Are you coughing up any blood?" If so ask: "How much?" (flecks, streaks, tablespoons, etc.)    No 6. TREATMENT: "What have you done so far to treat the cough?" (e.g., meds, fluids, humidifier)   Rx cough medicine 7. CARDIAC HISTORY: "Do you have any history of heart disease?" (e.g., heart attack, congestive heart failure)     none 8. LUNG HISTORY: "Do you have any history of lung disease?"  (e.g., pulmonary embolus, asthma, emphysema)     no 9. PE RISK FACTORS: "Do you have a history of blood clots?" (or: recent major surgery, recent prolonged travel, bedridden)    no 10. OTHER SYMPTOMS: "Do you have any other symptoms? (e.g., runny nose, wheezing, chest pain)     none 11. PREGNANCY: "Is there any chance you are pregnant?" "When was your last menstrual period?"      N/A 12. TRAVEL: "Have you traveled out of the country in the last month?" (e.g., travel history, exposures)     N/A no  Protocols used: COUGH - ACUTE NON-PRODUCTIVE-A-AH

## 2019-07-05 NOTE — Telephone Encounter (Signed)
Copied from [place] [id number]. Topic: General - Other >> Jul 05, 2019  8:55 AM Hinda Lenis D wrote: Reason for CRM: PT wants a call back, she was issues with her lungs and like to see a specialist / please advise

## 2019-07-05 NOTE — Addendum Note (Signed)
Addended by: Lelon Huh E on: 07/05/2019 12:15 PM   Modules accepted: Orders

## 2019-07-05 NOTE — Telephone Encounter (Signed)
Patient returned call. Triaged for cough. Patient is requesting CXR for persistent dry cough.  See triage note. Please advise patient

## 2019-07-05 NOTE — Telephone Encounter (Signed)
Patient advised.   She is also requesting a podiatry referral. She was seeing Dr. Milinda Pointer for plantar fasciitis, congential pes planus, and capsulitis. They are requiring a new referral.

## 2019-07-06 ENCOUNTER — Telehealth: Payer: Self-pay

## 2019-07-06 NOTE — Addendum Note (Signed)
Addended by: Birdie Sons on: 07/06/2019 08:04 AM   Modules accepted: Orders

## 2019-07-06 NOTE — Telephone Encounter (Signed)
-----   Message from Birdie Sons, MD sent at 07/06/2019  7:53 AM EDT ----- Chest Xray is clear. No sign of bronchitis or pneumonia. Cough is likely entirely due to allergies and sinus drainage. If not better in a few days then let me know and we could get a short course of prednisone.

## 2019-07-06 NOTE — Telephone Encounter (Signed)
Pt advised.   Thanks,   -Branae Crail  

## 2019-07-07 ENCOUNTER — Telehealth: Payer: Self-pay

## 2019-07-07 NOTE — Telephone Encounter (Signed)
Have you seen a fax from [facility] for Diabetic supplies?

## 2019-07-07 NOTE — Telephone Encounter (Signed)
I think so, if it was then it would be in medical records to be faxed.

## 2019-07-07 NOTE — Telephone Encounter (Signed)
Copied from [place] [phone]. Topic: General - Other >> Jul 07, 2019  9:42 AM Sheran Luz wrote: York Cerise with [facility] calling to inquire if patient's request for diabetic supply faxed to office yesterday has been received.

## 2019-07-13 NOTE — Telephone Encounter (Signed)
Yes, it was faxed. TNP

## 2019-07-19 ENCOUNTER — Telehealth: Payer: Self-pay | Admitting: Family Medicine

## 2019-07-19 MED ORDER — TRUE METRIX BLOOD GLUCOSE TEST VI STRP: ORAL_STRIP | 12 refills | Status: DC

## 2019-07-19 NOTE — Telephone Encounter (Signed)
Patient is calling to ask Dr. Caryn Section if She can reqeuest a prescription for a  she can take her blood sugar twice a day with True metrix strips Patient can received the monitor and the lancets already. Patient is wanting the script to be sent to [facility]. Please [phone]

## 2019-07-19 NOTE — Telephone Encounter (Signed)
Patient advised. RX for once daily sent to [facility]

## 2019-07-19 NOTE — Telephone Encounter (Signed)
Medicare only covers once a day unless on insulin or uncontrolled diabetes.

## 2019-08-18 ENCOUNTER — Other Ambulatory Visit: Payer: Self-pay

## 2019-08-18 ENCOUNTER — Ambulatory Visit: Payer: Medicare HMO | Admitting: Podiatry

## 2019-08-18 ENCOUNTER — Encounter: Payer: Self-pay | Admitting: Podiatry

## 2019-08-18 DIAGNOSIS — M7752 Other enthesopathy of left foot: Secondary | ICD-10-CM

## 2019-08-18 NOTE — Progress Notes (Signed)
Subjective:  Patient ID: Natalie Bray, female    DOB: 1934-04-09,  MRN: [id number] HPI Chief Complaint  Patient presents with  . Foot Pain    Patient presents today for continued left heel, ankle and arch pain.  She states "its really painful to walk far distances"   She says the custom brace does help her and she has been taking occasional Tylenol for relief    85 y.o. female presents with the above complaint.   ROS: Denies fever chills nausea vomiting muscle aches pains calf pain back pain chest pain shortness of breath.  Past Medical History:  Diagnosis Date  . Anemia   . Anemia, iron deficiency 08/08/2014  . Diabetes mellitus without complication ([place])   . History of chicken pox   . History of skin cancer   . Hyperlipidemia   . Hypertension    Past Surgical History:  Procedure Laterality Date  . ABDOMINAL HYSTERECTOMY    . Prolapsed bladder  03/2013   Dr. Zigmund Daniel  . REPLACEMENT TOTAL KNEE Left 01/2011   [facility]  . REPLACEMENT TOTAL KNEE Right 03/2010  . TOTAL ABDOMINAL HYSTERECTOMY W/ BILATERAL SALPINGOOPHORECTOMY  1974   BSO    Current Outpatient Medications:  .  amLODipine (NORVASC) 10 MG tablet, TAKE 1 TABLET EVERY DAY, Disp: 90 tablet, Rfl: 1 .  aspirin 81 MG chewable tablet, Chew 81 mg by mouth daily., Disp: , Rfl:  .  Blood Glucose Monitoring Suppl (ACCU-CHEK AVIVA PLUS) w/Device KIT, , Disp: , Rfl:  .  calcium-vitamin D (CALCIUM 500/D) 500-200 MG-UNIT per tablet, Take 1 tablet by mouth daily., Disp: , Rfl:  .  Cholecalciferol (VITAMIN D3) 1000 units CAPS, Take by mouth., Disp: , Rfl:  .  cloNIDine (CATAPRES) 0.1 MG tablet, Take one tablet as needed for blood pressure over 180. May repeat in 30 minutes. Do not take more than 3 in a day, Disp: 30 tablet, Rfl: 1 .  docusate sodium (COLACE) 100 MG capsule, Take 100 mg by mouth daily., Disp: , Rfl:  .  FLUoxetine (PROZAC) 10 MG capsule, Take 1 capsule (10 mg total) by mouth daily., Disp: 90 capsule, Rfl: 2 .   glipiZIDE (GLUCOTROL XL) 5 MG 24 hr tablet, TAKE 1 TABLET (5 MG TOTAL) BY MOUTH DAILY WITH BREAKFAST., Disp: 90 tablet, Rfl: 4 .  glucose blood (TRUE METRIX BLOOD GLUCOSE TEST) test strip, Use as instructed, Disp: 100 each, Rfl: 12 .  lovastatin (MEVACOR) 40 MG tablet, TAKE 1 TABLET EVERY DAY, Disp: 90 tablet, Rfl: 1 .  metFORMIN (GLUCOPHAGE) 1000 MG tablet, Take 1 tablet (1,000 mg total) by mouth 2 (two) times daily., Disp: 180 tablet, Rfl: 4 .  montelukast (SINGULAIR) 10 MG tablet, Take 1 tablet (10 mg total) by mouth at bedtime., Disp: 30 tablet, Rfl: 2 .  Omega-3 1000 MG CAPS, Take 1 capsule by mouth daily. , Disp: , Rfl:  .  omeprazole (PRILOSEC) 20 MG capsule, TAKE 1 CAPSULE EVERY DAY, Disp: 90 capsule, Rfl: 1 .  spironolactone (ALDACTONE) 25 MG tablet, Take 1 tablet (25 mg total) by mouth daily., Disp: 90 tablet, Rfl: 3 .  traZODone (DESYREL) 100 MG tablet, Take 1 tablet (100 mg total) by mouth at bedtime., Disp: 30 tablet, Rfl: 2 .  valsartan-hydrochlorothiazide (DIOVAN HCT) 160-25 MG tablet, Take 1 tablet by mouth daily., Disp: 90 tablet, Rfl: 1 .  vitamin E (E-400) 400 UNIT capsule, Take 1 capsule by mouth daily., Disp: , Rfl:   Allergies  Allergen Reactions  .  Celecoxib Rash and Other (See Comments)    Flushed red flushing Flushed red    Review of Systems Objective:  There were no vitals filed for this visit.  General: Well developed, nourished, in no acute distress, alert and oriented x3   Dermatological: Skin is warm, dry and supple bilateral. Nails x 10 are well maintained; remaining integument appears unremarkable at this time. There are no open sores, no preulcerative lesions, no rash or signs of infection present.  Vascular: Dorsalis Pedis artery and Posterior Tibial artery pedal pulses are 2/4 bilateral with immedate capillary fill time. Pedal hair growth present. No varicosities and no lower extremity edema present bilateral.   Neruologic: Grossly intact via light  touch bilateral. Vibratory intact via tuning fork bilateral. Protective threshold with Semmes Wienstein monofilament intact to all pedal sites bilateral. Patellar and Achilles deep tendon reflexes 2+ bilateral. No Babinski or clonus noted bilateral.   Musculoskeletal: No gross boney pedal deformities bilateral. No pain, crepitus, or limitation noted with foot and ankle range of motion bilateral. Muscular strength 5/5 in all groups tested bilateral.  She has severe pes planus and currently wears a mezo brace.  She has pain on palpation of the sinus tarsi and pain on end range of motion of the subtalar joint.  Gait: Unassisted, Nonantalgic.    Radiographs:  None taken  Assessment & Plan:   Assessment: Severe pes planus with impingement of subtalar joint left.  Plan: Discussed etiology pathology conservative surgical therapies expressed to her and make sure that she is wearing her brace at all times.  Also injected 10 mg Kenalog 5 mg Marcaine to the subtalar joint left through the sinus tarsi.  She tolerated procedure well without complications.     Horald Birky T. [place], [place]

## 2019-09-29 ENCOUNTER — Ambulatory Visit: Payer: Medicare HMO | Admitting: Family Medicine

## 2019-09-30 ENCOUNTER — Other Ambulatory Visit: Payer: Self-pay | Admitting: Family Medicine

## 2019-09-30 DIAGNOSIS — I10 Essential (primary) hypertension: Secondary | ICD-10-CM

## 2019-10-11 ENCOUNTER — Other Ambulatory Visit: Payer: Self-pay

## 2019-10-11 ENCOUNTER — Encounter: Payer: Self-pay | Admitting: Family Medicine

## 2019-10-11 ENCOUNTER — Ambulatory Visit (INDEPENDENT_AMBULATORY_CARE_PROVIDER_SITE_OTHER): Payer: Medicare HMO | Admitting: Family Medicine

## 2019-10-11 VITALS — BP 113/64 | HR 76 | Temp 98.6°F | Ht 66.0 in | Wt 157.0 lb

## 2019-10-11 DIAGNOSIS — F439 Reaction to severe stress, unspecified: Secondary | ICD-10-CM

## 2019-10-11 DIAGNOSIS — E1122 Type 2 diabetes mellitus with diabetic chronic kidney disease: Secondary | ICD-10-CM | POA: Diagnosis not present

## 2019-10-11 DIAGNOSIS — Z23 Encounter for immunization: Secondary | ICD-10-CM

## 2019-10-11 DIAGNOSIS — N181 Chronic kidney disease, stage 1: Secondary | ICD-10-CM

## 2019-10-11 DIAGNOSIS — F5104 Psychophysiologic insomnia: Secondary | ICD-10-CM

## 2019-10-11 DIAGNOSIS — I10 Essential (primary) hypertension: Secondary | ICD-10-CM | POA: Diagnosis not present

## 2019-10-11 LAB — POCT GLYCOSYLATED HEMOGLOBIN (HGB A1C): Hemoglobin A1C: 6.7 % — AB (ref 4.0–5.6)

## 2019-10-11 NOTE — Progress Notes (Signed)
Established patient visit   Patient: Natalie Bray   DOB: 1934-09-09   85 y.o. Female  MRN: [id number] Visit Date: 10/11/2019  Today's healthcare provider: Lelon Huh, MD   Chief Complaint  Patient presents with  . Diabetes  . Hypertension  . Insomnia   Subjective    HPI  Hypertension, follow-up  BP Readings from Last 3 Encounters:  10/11/19 113/64  06/25/19 128/60  05/28/19 128/71   Wt Readings from Last 3 Encounters:  10/11/19 157 lb (71.2 kg)  06/25/19 161 lb (73 kg)  05/28/19 157 lb (71.2 kg)     She was last seen for hypertension 3 months ago.  BP at that visit was 128/60. Management since that visit includes continue same medication.  She reports good compliance with treatment. She is not having side effects.  She is following a Regular diet. She is not exercising. She does smoke.  Use of agents associated with hypertension: NSAIDS.   Outside blood pressures are checked occasionally. Symptoms: No chest pain No chest pressure  No palpitations No syncope  No dyspnea No orthopnea  No paroxysmal nocturnal dyspnea No lower extremity edema   Pertinent labs: Lab Results  Component Value Date   CHOL 184 05/28/2019   HDL 55 05/28/2019   LDLCALC 81 05/28/2019   TRIG 293 (H) 05/28/2019   CHOLHDL 3.3 05/28/2019   Lab Results  Component Value Date   NA 138 05/28/2019   K 4.7 05/28/2019   CREATININE 0.90 05/28/2019   GFRNONAA 60 05/28/2019   GFRAA 69 05/28/2019   GLUCOSE 123 (H) 05/28/2019     The ASCVD Risk score (Goff DC Jr., et al., 2013) failed to calculate for the following reasons:   The 2013 ASCVD risk score is only valid for ages 61 to 8    Follow up for insomnia:  The patient was last seen for this 3 months ago. Changes made at last visit include trying Trazodone.  She reports good compliance with treatment. She feels that condition is Improved. She is only taking about once a week.  She is not having side effects.      Diabetes Mellitus Type II, Follow-up  Lab Results  Component Value Date   HGBA1C 6.7 (A) 10/11/2019   HGBA1C 6.5 (A) 05/28/2019   HGBA1C 6.6 (A) 06/24/2018   Wt Readings from Last 3 Encounters:  10/11/19 157 lb (71.2 kg)  06/25/19 161 lb (73 kg)  05/28/19 157 lb (71.2 kg)   Last seen for diabetes 4 months ago.  Management since then includes continue same medications. She reports good compliance with treatment. She is not having side effects.  Symptoms: No fatigue No foot ulcerations  No appetite changes No nausea  No paresthesia of the feet  No polydipsia  No polyuria No visual disturbances   No vomiting     Home blood sugar records: fasting range: 110s  Episodes of hypoglycemia? No    Current insulin regiment: none Most Recent Eye Exam: not UTD Current exercise: none Current diet habits: well balanced  Pertinent Labs: Lab Results  Component Value Date   CHOL 184 05/28/2019   HDL 55 05/28/2019   LDLCALC 81 05/28/2019   TRIG 293 (H) 05/28/2019   CHOLHDL 3.3 05/28/2019   Lab Results  Component Value Date   NA 138 05/28/2019   K 4.7 05/28/2019   CREATININE 0.90 05/28/2019   GFRNONAA 60 05/28/2019   GFRAA 69 05/28/2019   GLUCOSE 123 (H) 05/28/2019  Medications: Outpatient Medications Prior to Visit  Medication Sig  . amLODipine (NORVASC) 10 MG tablet TAKE 1 TABLET EVERY DAY  . aspirin 81 MG chewable tablet Chew 81 mg by mouth daily.  . Blood Glucose Monitoring Suppl (ACCU-CHEK AVIVA PLUS) w/Device KIT   . calcium-vitamin D (CALCIUM 500/D) 500-200 MG-UNIT per tablet Take 1 tablet by mouth daily.  . Cholecalciferol (VITAMIN D3) 1000 units CAPS Take by mouth.  . cloNIDine (CATAPRES) 0.1 MG tablet Take one tablet as needed for blood pressure over 180. May repeat in 30 minutes. Do not take more than 3 in a day  . docusate sodium (COLACE) 100 MG capsule Take 100 mg by mouth daily.  Marland Kitchen FLUoxetine (PROZAC) 10 MG capsule Take 1 capsule (10 mg total)  by mouth daily.  Marland Kitchen glipiZIDE (GLUCOTROL XL) 5 MG 24 hr tablet TAKE 1 TABLET (5 MG TOTAL) BY MOUTH DAILY WITH BREAKFAST.  Marland Kitchen glucose blood (TRUE METRIX BLOOD GLUCOSE TEST) test strip Use as instructed  . lovastatin (MEVACOR) 40 MG tablet TAKE 1 TABLET EVERY DAY  . metFORMIN (GLUCOPHAGE) 1000 MG tablet Take 1 tablet (1,000 mg total) by mouth 2 (two) times daily.  . montelukast (SINGULAIR) 10 MG tablet Take 1 tablet (10 mg total) by mouth at bedtime.  . Omega-3 1000 MG CAPS Take 1 capsule by mouth daily.   Marland Kitchen omeprazole (PRILOSEC) 20 MG capsule TAKE 1 CAPSULE EVERY DAY  . spironolactone (ALDACTONE) 25 MG tablet TAKE 1 TABLET EVERY DAY  . traZODone (DESYREL) 100 MG tablet Take 1 tablet (100 mg total) by mouth at bedtime.  . valsartan-hydrochlorothiazide (DIOVAN HCT) 160-25 MG tablet Take 1 tablet by mouth daily.  . vitamin E (E-400) 400 UNIT capsule Take 1 capsule by mouth daily.   No facility-administered medications prior to visit.    Review of Systems  Constitutional: Negative.   Respiratory: Negative.   Cardiovascular: Negative.   Gastrointestinal: Negative.   Musculoskeletal: Negative.   Neurological: Negative.       Objective    BP 113/64 (BP Location: Right Arm)   Pulse 76   Temp 98.6 F (37 C)   Ht '5\' 6"'  (1.676 m)   Wt 157 lb (71.2 kg)   BMI 25.34 kg/m    Physical Exam   General: Appearance:     Overweight female in no acute distress  Eyes:    PERRL, conjunctiva/corneas clear, EOM's intact       Lungs:     Clear to auscultation bilaterally, respirations unlabored  Heart:    Normal heart rate. Normal rhythm. No murmurs, rubs, or gallops.   MS:   All extremities are intact.   Neurologic:   Awake, alert, oriented x 3. No apparent focal neurological           defect.         Results for orders placed or performed in visit on 10/11/19  POCT HgB A1C  Result Value Ref Range   Hemoglobin A1C 6.7 (A) 4.0 - 5.6 %   HbA1c POC (<> result, manual entry)     HbA1c, POC  (prediabetic range)     HbA1c, POC (controlled diabetic range)      Assessment & Plan     1. Type 2 diabetes mellitus with stage 1 chronic kidney disease, without long-term current use of insulin (HCC) Very well controlled. Continue current medications.    2. Essential hypertension Well controlled.  Continue current medications.    3. Situational stress Doing well with fluoxetine, although  she does not take it consistently every day.   4. Psychophysiological insomnia Doing well with prn trazodone.   Counseled regarding Covid booster shots.    Follow up 4 months.       The entirety of the information documented in the History of Present Illness, Review of Systems and Physical Exam were personally obtained by me. Portions of this information were initially documented by the CMA and reviewed by me for thoroughness and accuracy.      Lelon Huh, MD  [facility] [phone] (phone) [phone] (fax)  [place]

## 2019-10-11 NOTE — Patient Instructions (Signed)
•   You need to wait at least 2 weeks after getting your flu shot today before you get the Covid booster shot   You can call our office in Mid October to see if we can schedule you for the Covid booster

## 2019-10-19 ENCOUNTER — Other Ambulatory Visit: Payer: Self-pay | Admitting: Family Medicine

## 2019-10-19 NOTE — Telephone Encounter (Signed)
Requested Prescriptions  Pending Prescriptions Disp Refills  . lovastatin (MEVACOR) 40 MG tablet [Pharmacy Med Name: LOVASTATIN 40 MG Tablet] 90 tablet 2    Sig: TAKE 1 TABLET EVERY DAY     Cardiovascular:  Antilipid - Statins Failed - 10/19/2019 10:38 PM      Failed - LDL in normal range and within 360 days    LDL Chol Calc (NIH)  Date Value Ref Range Status  05/28/2019 81 0 - 99 mg/dL Final         Failed - Triglycerides in normal range and within 360 days    Triglycerides  Date Value Ref Range Status  05/28/2019 293 (H) 0 - 149 mg/dL Final         Passed - Total Cholesterol in normal range and within 360 days    Cholesterol, Total  Date Value Ref Range Status  05/28/2019 184 100 - 199 mg/dL Final         Passed - HDL in normal range and within 360 days    HDL  Date Value Ref Range Status  05/28/2019 55 >39 mg/dL Final         Passed - Patient is not pregnant      Passed - Valid encounter within last 12 months    Recent Outpatient Visits          1 week ago Type 2 diabetes mellitus with stage 1 chronic kidney disease, without long-term current use of insulin ([place])   [facility] Birdie Sons, MD   3 months ago Essential hypertension   [facility] Birdie Sons, MD   4 months ago Cough   [facility] Birdie Sons, MD   4 months ago Type 2 diabetes mellitus with stage 1 chronic kidney disease, without long-term current use of insulin [facility])   [facility] Birdie Sons, MD   5 months ago Seasonal allergic rhinitis due to pollen   [place], Kelby Aline, FNP      Future Appointments            In 3 months Fisher, Kirstie Peri, MD [facility], [place]

## 2019-11-01 DIAGNOSIS — D485 Neoplasm of uncertain behavior of skin: Secondary | ICD-10-CM | POA: Diagnosis not present

## 2019-11-01 DIAGNOSIS — C44519 Basal cell carcinoma of skin of other part of trunk: Secondary | ICD-10-CM | POA: Diagnosis not present

## 2019-11-01 DIAGNOSIS — Z85828 Personal history of other malignant neoplasm of skin: Secondary | ICD-10-CM | POA: Diagnosis not present

## 2019-11-01 DIAGNOSIS — D2262 Melanocytic nevi of left upper limb, including shoulder: Secondary | ICD-10-CM | POA: Diagnosis not present

## 2019-11-01 DIAGNOSIS — D2261 Melanocytic nevi of right upper limb, including shoulder: Secondary | ICD-10-CM | POA: Diagnosis not present

## 2019-11-01 DIAGNOSIS — D225 Melanocytic nevi of trunk: Secondary | ICD-10-CM | POA: Diagnosis not present

## 2019-11-01 DIAGNOSIS — D2272 Melanocytic nevi of left lower limb, including hip: Secondary | ICD-10-CM | POA: Diagnosis not present

## 2019-11-01 DIAGNOSIS — L570 Actinic keratosis: Secondary | ICD-10-CM | POA: Diagnosis not present

## 2019-11-01 DIAGNOSIS — D0462 Carcinoma in situ of skin of left upper limb, including shoulder: Secondary | ICD-10-CM | POA: Diagnosis not present

## 2019-11-01 DIAGNOSIS — L820 Inflamed seborrheic keratosis: Secondary | ICD-10-CM | POA: Diagnosis not present

## 2019-11-01 DIAGNOSIS — L538 Other specified erythematous conditions: Secondary | ICD-10-CM | POA: Diagnosis not present

## 2019-11-09 ENCOUNTER — Ambulatory Visit (INDEPENDENT_AMBULATORY_CARE_PROVIDER_SITE_OTHER): Payer: Medicare HMO | Admitting: Family Medicine

## 2019-11-09 ENCOUNTER — Other Ambulatory Visit: Payer: Self-pay

## 2019-11-09 ENCOUNTER — Encounter: Payer: Self-pay | Admitting: Family Medicine

## 2019-11-09 VITALS — BP 112/54 | HR 66 | Temp 98.3°F | Resp 16 | Wt 159.0 lb

## 2019-11-09 DIAGNOSIS — I10 Essential (primary) hypertension: Secondary | ICD-10-CM

## 2019-11-09 DIAGNOSIS — K219 Gastro-esophageal reflux disease without esophagitis: Secondary | ICD-10-CM | POA: Diagnosis not present

## 2019-11-09 MED ORDER — FAMOTIDINE 40 MG PO TABS: 40.0000 mg | ORAL_TABLET | Freq: Every evening | ORAL | 1 refills | Status: DC

## 2019-11-09 MED ORDER — CLONIDINE HCL 0.1 MG PO TABS: ORAL_TABLET | ORAL | 1 refills | Status: DC

## 2019-11-09 NOTE — Patient Instructions (Addendum)
.   Please review the attached list of medications and notify my office if there are any errors.   Natalie Bray contact you next week and if your doing better on the famotidine we'll send in a prescription to your mail-order pharmacy.

## 2019-11-09 NOTE — Progress Notes (Signed)
Established patient visit   Patient: Natalie Bray   DOB: 1934/12/19   85 y.o. Female  MRN: [id number] Visit Date: 11/09/2019  Today's healthcare provider: Lelon Huh, MD   Chief Complaint  Patient presents with  . Gastroesophageal Reflux   Subjective    HPI  Follow up for GERD:  Current treatment includes Omeprazole 88m daily.  She reports good compliance with treatment. She feels that condition is Worse. Patient states reports that the currently regiment is no longer effective at controlling symptoms. She has been having more reflux reflux  Symptoms the last week throughout the day, no change in diet.  She is not having side effects. She states she had an episode of reflux last night. She checked her blood pressure and it was 200/90 with a heart rate of 109. She took one dose of Clonodine and her systolic blood pressure reading came down to 155.   -----------------------------------------------------------------------------------------      Medications: Outpatient Medications Prior to Visit  Medication Sig  . amLODipine (NORVASC) 10 MG tablet TAKE 1 TABLET EVERY DAY  . aspirin 81 MG chewable tablet Chew 81 mg by mouth daily.  . Blood Glucose Monitoring Suppl (ACCU-CHEK AVIVA PLUS) w/Device KIT   . calcium-vitamin D (CALCIUM 500/D) 500-200 MG-UNIT per tablet Take 1 tablet by mouth daily.  . Cholecalciferol (VITAMIN D3) 1000 units CAPS Take by mouth.  . cloNIDine (CATAPRES) 0.1 MG tablet Take one tablet as needed for blood pressure over 180. May repeat in 30 minutes. Do not take more than 3 in a day  . docusate sodium (COLACE) 100 MG capsule Take 100 mg by mouth daily.  .Marland KitchenFLUoxetine (PROZAC) 10 MG capsule Take 1 capsule (10 mg total) by mouth daily.  .Marland KitchenglipiZIDE (GLUCOTROL XL) 5 MG 24 hr tablet TAKE 1 TABLET (5 MG TOTAL) BY MOUTH DAILY WITH BREAKFAST.  .Marland Kitchenglucose blood (TRUE METRIX BLOOD GLUCOSE TEST) test strip Use as instructed  . lovastatin (MEVACOR) 40 MG  tablet TAKE 1 TABLET EVERY DAY  . metFORMIN (GLUCOPHAGE) 1000 MG tablet Take 1 tablet (1,000 mg total) by mouth 2 (two) times daily.  . montelukast (SINGULAIR) 10 MG tablet Take 1 tablet (10 mg total) by mouth at bedtime.  . Omega-3 1000 MG CAPS Take 1 capsule by mouth daily.   .Marland Kitchenomeprazole (PRILOSEC) 20 MG capsule TAKE 1 CAPSULE EVERY DAY  . spironolactone (ALDACTONE) 25 MG tablet TAKE 1 TABLET EVERY DAY  . traZODone (DESYREL) 100 MG tablet Take 1 tablet (100 mg total) by mouth at bedtime.  . valsartan-hydrochlorothiazide (DIOVAN HCT) 160-25 MG tablet Take 1 tablet by mouth daily.  . vitamin E (E-400) 400 UNIT capsule Take 1 capsule by mouth daily.   No facility-administered medications prior to visit.    Review of Systems  Constitutional: Negative for appetite change, chills, fatigue and fever.  Respiratory: Negative for chest tightness and shortness of breath.   Cardiovascular: Negative for chest pain and palpitations.  Gastrointestinal: Negative for abdominal pain, nausea and vomiting.       Acid reflux  Neurological: Negative for dizziness and weakness.      Objective    BP (!) 112/54 (BP Location: Right Arm, Patient Position: Sitting, Cuff Size: Large)   Pulse 66   Temp 98.3 F (36.8 C) (Oral)   Resp 16   Wt 159 lb (72.1 kg)   BMI 25.66 kg/m    Physical Exam  General appearance:  Overweight female, cooperative and in no acute  distress Head: Normocephalic, without obvious abnormality, atraumatic Respiratory: Respirations even and unlabored, normal respiratory rate Extremities: All extremities are intact.  Skin: Skin color, texture, turgor normal. No rashes seen  Psych: Appropriate mood and affect. Neurologic: Mental status: Alert, oriented to person, place, and time, thought content appropriate.     Assessment & Plan     1. Gastroesophageal reflux disease, unspecified whether esophagitis present Was well controlled on monotherapy with omeprazole, but flared up  the last week, will add  - famotidine (PEPCID) 40 MG tablet; Take 1 tablet (40 mg total) by mouth every evening.  Dispense: 30 tablet; Refill: 1  Will need to send mail order prescription if doing better on famotidine.   2. Essential hypertension High home BP associated with reflux symptoms, but much better today. Continue current medications.  refill- cloNIDine (CATAPRES) 0.1 MG tablet; Take one tablet as needed for blood pressure over 180. May repeat in 30 minutes. Do not take more than 3 in a day  Dispense: 30 tablet; Refill: 1 which she only takes prn.       The entirety of the information documented in the History of Present Illness, Review of Systems and Physical Exam were personally obtained by me. Portions of this information were initially documented by the CMA and reviewed by me for thoroughness and accuracy.      Lelon Huh, MD  [facility] [phone] (phone) [phone] (fax)  [place]

## 2019-11-18 ENCOUNTER — Telehealth: Payer: Self-pay | Admitting: Family Medicine

## 2019-11-18 NOTE — Telephone Encounter (Signed)
Patient was started on famotidine for reflux at her visit a few weeks ago, please see if she is doing better. If so then we can send prescription to her mail order pharmacy. If not we can try something different.

## 2019-11-19 NOTE — Telephone Encounter (Signed)
I called and spoke with patient. She states after her last office visit she only took one dose of the Famotidine and her reflux symptoms completly resolved. She says still has 29 pills left and has not needed to anymore medication of reflux. Patient does not want [place] to send in a prescription to her mail order pharmacy since she is doing better.

## 2019-12-02 DIAGNOSIS — D0462 Carcinoma in situ of skin of left upper limb, including shoulder: Secondary | ICD-10-CM | POA: Diagnosis not present

## 2019-12-06 ENCOUNTER — Other Ambulatory Visit: Payer: Self-pay | Admitting: Family Medicine

## 2019-12-06 DIAGNOSIS — I10 Essential (primary) hypertension: Secondary | ICD-10-CM

## 2019-12-06 NOTE — Telephone Encounter (Signed)
Requested medication (s) are due for refill today: Yes  Requested medication (s) are on the active medication list: Yes  Last refill:  08/11/18  Future visit scheduled: Yes  Notes to clinic: Unable to refill, expired Rx     Requested Prescriptions  Pending Prescriptions Disp Refills   metFORMIN (GLUCOPHAGE) 1000 MG tablet [Pharmacy Med Name: METFORMIN HYDROCHLORIDE 1000 MG Tablet] 180 tablet 4    Sig: TAKE 1 TABLET (1,000 MG TOTAL) BY MOUTH 2 (TWO) TIMES DAILY.      Endocrinology:  Diabetes - Biguanides Passed - 12/06/2019  9:11 AM      Passed - Cr in normal range and within 360 days    Creatinine  Date Value Ref Range Status  07/23/2011 0.59 (L) 0.60 - 1.30 mg/dL Final   Creatinine, Ser  Date Value Ref Range Status  05/28/2019 0.90 0.57 - 1.00 mg/dL Final   Creatinine, POC  Date Value Ref Range Status  09/11/2015 n/a mg/dL Final          Passed - HBA1C is between 0 and 7.9 and within 180 days    Hemoglobin A1C  Date Value Ref Range Status  10/11/2019 6.7 (A) 4.0 - 5.6 % Final   Hgb A1c MFr Bld  Date Value Ref Range Status  05/10/2014 7.0 (A) 4.0 - 6.0 % Final          Passed - eGFR in normal range and within 360 days    EGFR (African American)  Date Value Ref Range Status  07/23/2011 >60  Final   GFR calc Af Amer  Date Value Ref Range Status  05/28/2019 69 >59 mL/min/1.73 Final    Comment:    **[facility] currently reports eGFR in compliance with the current**   recommendations of the National Kidney Foundation. [facility] will   update reporting as new guidelines are published from the NKF-ASN   Task force.    EGFR (Non-African Amer.)  Date Value Ref Range Status  07/23/2011 >60  Final    Comment:    eGFR values <60mL/min/1.73 m2 may be an indication of chronic kidney disease (CKD). Calculated eGFR is useful in patients with stable renal function. The eGFR calculation will not be reliable in acutely ill patients when serum creatinine is changing  rapidly. It is not useful in  patients on dialysis. The eGFR calculation may not be applicable to patients at the low and high extremes of body sizes, pregnant women, and vegetarians.    GFR calc non Af Amer  Date Value Ref Range Status  05/28/2019 60 >59 mL/min/1.73 Final          Passed - Valid encounter within last 6 months    Recent Outpatient Visits           3 weeks ago Gastroesophageal reflux disease, unspecified whether esophagitis present   [facility] Fisher, Donald E, MD   1 month ago Type 2 diabetes mellitus with stage 1 chronic kidney disease, without long-term current use of insulin (H[facility])   [facility] Fisher, Donald E, MD   5 months ago Essential hypertension   [facility] Fisher, Donald E, MD   5 months ago Cough   [facility] Fisher, Donald E, MD   6 months ago Type 2 diabetes mellitus with stage 1 chronic kidney disease, without long-term current use of insulin (HCC)   [facility] Fisher, Donald E, MD       Future Appointments               In 2 months Fisher, Donald E, MD [facility]             Signed Prescriptions Disp Refills   valsartan-hydrochlorothiazide (DIOVAN-HCT) 160-25 MG tablet 90 tablet 1    Sig: TAKE 1 TABLET EVERY DAY      Cardiovascular: ARB + Diuretic Combos Failed - 12/06/2019  9:11 AM      Failed - K in normal range and within 180 days    Potassium  Date Value Ref Range Status  05/28/2019 4.7 3.5 - 5.2 mmol/L Final  07/23/2011 3.3 (L) 3.5 - 5.1 mmol/L Final          Failed - Na in normal range and within 180 days    Sodium  Date Value Ref Range Status  05/28/2019 138 134 - 144 mmol/L Final  07/23/2011 145 136 - 145 mmol/L Final          Failed - Cr in normal range and within 180 days    Creatinine  Date Value Ref Range Status  07/23/2011 0.59 (L) 0.60 - 1.30 mg/dL Final   Creatinine, Ser  Date Value Ref Range Status   05/28/2019 0.90 0.57 - 1.00 mg/dL Final   Creatinine, POC  Date Value Ref Range Status  09/11/2015 n/a mg/dL Final          Failed - Ca in normal range and within 180 days    Calcium  Date Value Ref Range Status  05/28/2019 10.7 (H) 8.7 - 10.3 mg/dL Final   Calcium, Total  Date Value Ref Range Status  07/23/2011 8.5 8.5 - 10.1 mg/dL Final          Passed - Patient is not pregnant      Passed - Last BP in normal range    BP Readings from Last 1 Encounters:  11/09/19 (!) 112/54          Passed - Valid encounter within last 6 months    Recent Outpatient Visits           3 weeks ago Gastroesophageal reflux disease, unspecified whether esophagitis present   [facility] Fisher, Donald E, MD   1 month ago Type 2 diabetes mellitus with stage 1 chronic kidney disease, without long-term current use of insulin (HCC)   [facility] Fisher, Donald E, MD   5 months ago Essential hypertension   [facility] Fisher, Donald E, MD   5 months ago Cough   [facility] Fisher, Donald E, MD   6 months ago Type 2 diabetes mellitus with stage 1 chronic kidney disease, without long-term current use of insulin (HCC)   [facility] Fisher, Donald E, MD       Future Appointments             In 2 months Fisher, Donald E, MD [facility]                 

## 2019-12-07 NOTE — Progress Notes (Signed)
Subjective:   Natalie Bray is a 85 y.o. female who presents for Medicare Annual (Subsequent) preventive examination.  I connected with Dennard Schaumann today by telephone and verified that I am speaking with the correct person using two identifiers. Location patient: home Location provider: work Persons participating in the virtual visit: patient, provider.   I discussed the limitations, risks, security and privacy concerns of performing an evaluation and management service by telephone and the availability of in person appointments. I also discussed with the patient that there may be a patient responsible charge related to this service. The patient expressed understanding and verbally consented to this telephonic visit.    Interactive audio and video telecommunications were attempted between this provider and patient, however failed, due to patient having technical difficulties OR patient did not have access to video capability.  We continued and completed visit with audio only.   Review of Systems    N/A  Cardiac Risk Factors include: advanced age (>80mn, >>25women);dyslipidemia;hypertension;diabetes mellitus     Objective:    There were no vitals filed for this visit. There is no height or weight on file to calculate BMI.  Advanced Directives 12/08/2019 06/23/2018 01/31/2018 08/28/2017 02/19/2015  Does Patient Have a Medical Advance Directive? Yes No No No No  Type of AParamedicof APughtownLiving will - - - -  Does patient want to make changes to medical advance directive? - - - No - Patient declined -  Copy of [place]in Chart? No - copy requested - - - -  Would patient like information on creating a medical advance directive? - No - Patient declined - - No - patient declined information    Current Medications (verified) Outpatient Encounter Medications as of 12/08/2019  Medication Sig  . amLODipine (NORVASC) 10 MG tablet TAKE 1  TABLET EVERY DAY  . aspirin 81 MG chewable tablet Chew 81 mg by mouth daily.  . Blood Glucose Monitoring Suppl (ACCU-CHEK AVIVA PLUS) w/Device KIT   . calcium-vitamin D (CALCIUM 500/D) 500-200 MG-UNIT per tablet Take 1 tablet by mouth daily.  . Cholecalciferol (VITAMIN D3) 1000 units CAPS Take by mouth.  . cloNIDine (CATAPRES) 0.1 MG tablet Take one tablet as needed for blood pressure over 180. May repeat in 30 minutes. Do not take more than 3 in a day  . docusate sodium (COLACE) 100 MG capsule Take 100 mg by mouth daily.  . famotidine (PEPCID) 40 MG tablet Take 1 tablet (40 mg total) by mouth every evening.  .Marland KitchenFLUoxetine (PROZAC) 10 MG capsule Take 1 capsule (10 mg total) by mouth daily.  .Marland KitchenglipiZIDE (GLUCOTROL XL) 5 MG 24 hr tablet TAKE 1 TABLET (5 MG TOTAL) BY MOUTH DAILY WITH BREAKFAST.  .Marland Kitchenglucose blood (TRUE METRIX BLOOD GLUCOSE TEST) test strip Use as instructed  . lovastatin (MEVACOR) 40 MG tablet TAKE 1 TABLET EVERY DAY  . metFORMIN (GLUCOPHAGE) 1000 MG tablet TAKE 1 TABLET (1,000 MG TOTAL) BY MOUTH 2 (TWO) TIMES DAILY.  . montelukast (SINGULAIR) 10 MG tablet Take 1 tablet (10 mg total) by mouth at bedtime.  . Omega-3 1000 MG CAPS Take 1 capsule by mouth daily.   .Marland Kitchenomeprazole (PRILOSEC) 20 MG capsule TAKE 1 CAPSULE EVERY DAY  . spironolactone (ALDACTONE) 25 MG tablet TAKE 1 TABLET EVERY DAY  . traZODone (DESYREL) 100 MG tablet Take 1 tablet (100 mg total) by mouth at bedtime.  . valsartan-hydrochlorothiazide (DIOVAN-HCT) 160-25 MG tablet TAKE 1 TABLET EVERY  DAY  . vitamin E (E-400) 400 UNIT capsule Take 1 capsule by mouth daily.  . TRUEplus Lancets 33G MISC   . [DISCONTINUED] omeprazole (PRILOSEC) 20 MG capsule TAKE 1 CAPSULE EVERY DAY  . [DISCONTINUED] spironolactone (ALDACTONE) 25 MG tablet TAKE 1 TABLET EVERY DAY   No facility-administered encounter medications on file as of 12/08/2019.    Allergies (verified) Celecoxib   History: Past Medical History:  Diagnosis Date  .  Anemia   . Anemia, iron deficiency 08/08/2014  . Diabetes mellitus without complication ([place])   . History of chicken pox   . History of skin cancer   . Hyperlipidemia   . Hypertension   . Skin cancer    Past Surgical History:  Procedure Laterality Date  . ABDOMINAL HYSTERECTOMY    . Prolapsed bladder  03/2013   Dr. Zigmund Daniel  . REPLACEMENT TOTAL KNEE Left 01/2011   [facility]  . REPLACEMENT TOTAL KNEE Right 03/2010  . TOTAL ABDOMINAL HYSTERECTOMY W/ BILATERAL SALPINGOOPHORECTOMY  1974   BSO   Family History  Problem Relation Age of Onset  . Kidney failure Mother   . Aneurysm Father   . Hypertension Sister   . Diabetes Sister   . Diabetes Brother   . Hypertension Brother   . Diabetes Sister   . Hypertension Sister   . Diabetes Brother   . Hypertension Brother    Social History   Socioeconomic History  . Marital status: Widowed    Spouse name: Not on file  . Number of children: 2  . Years of education: 84  . Highest education level: 12th grade  Occupational History  . Occupation: retired  Tobacco Use  . Smoking status: Never Smoker  . Smokeless tobacco: Never Used  Vaping Use  . Vaping Use: Never used  Substance and Sexual Activity  . Alcohol use: No    Alcohol/week: 0.0 standard drinks  . Drug use: No  . Sexual activity: Not Currently  Other Topics Concern  . Not on file  Social History Narrative  . Not on file   Social Determinants of Health   Financial Resource Strain: Low Risk   . Difficulty of Paying Living Expenses: Not hard at all  Food Insecurity: No Food Insecurity  . Worried About Charity fundraiser in the Last Year: Never true  . Ran Out of Food in the Last Year: Never true  Transportation Needs: No Transportation Needs  . Lack of Transportation (Medical): No  . Lack of Transportation (Non-Medical): No  Physical Activity: Inactive  . Days of Exercise per Week: 0 days  . Minutes of Exercise per Session: 0 min  Stress: No Stress Concern Present    . Feeling of Stress : Not at all  Social Connections: Moderately Isolated  . Frequency of Communication with Friends and Family: More than three times a week  . Frequency of Social Gatherings with Friends and Family: More than three times a week  . Attends Religious Services: More than 4 times per year  . Active Member of Clubs or Organizations: No  . Attends Archivist Meetings: Never  . Marital Status: Widowed    Tobacco Counseling Counseling given: Not Answered   Clinical Intake:  Pre-visit preparation completed: Yes  Pain : No/denies pain     Nutritional Risks: None Diabetes: Yes  How often do you need to have someone help you when you read instructions, pamphlets, or other written materials from your doctor or pharmacy?: 1 - Never  Diabetic?  Yes  Nutrition Risk Assessment:  Has the patient had any N/V/D within the last 2 months?  No  Does the patient have any non-healing wounds?  No  Has the patient had any unintentional weight loss or weight gain?  No   Diabetes:  Is the patient diabetic?  Yes  If diabetic, was a CBG obtained today?  No  Did the patient bring in their glucometer from home?  No  How often do you monitor your CBG's? Once to twice a day.   Financial Strains and Diabetes Management:  Are you having any financial strains with the device, your supplies or your medication? No .  Does the patient want to be seen by Chronic Care Management for management of their diabetes?  No  Would the patient like to be referred to a Nutritionist or for Diabetic Management?  No   Diabetic Exams:  Diabetic Eye Exam: Overdue for diabetic eye exam. Pt has been advised about the importance in completing this exam. Patient has an apt scheduled 02/2020. Diabetic Foot Exam: Completed 08/18/19.   Interpreter Needed?: No  Information entered by :: [facility], LPN   Activities of Daily Living In your present state of health, do you have any difficulty  performing the following activities: 12/08/2019 11/09/2019  Hearing? N N  Vision? N N  Difficulty concentrating or making decisions? N N  Walking or climbing stairs? N N  Dressing or bathing? N N  Doing errands, shopping? N N  Preparing Food and eating ? N -  Using the Toilet? N -  In the past six months, have you accidently leaked urine? N -  Do you have problems with loss of bowel control? N -  Managing your Medications? N -  Managing your Finances? N -  Housekeeping or managing your Housekeeping? N -  Some recent data might be hidden    Patient Care Team: Birdie Sons, MD as PCP - General (Family Medicine) Dasher, Rayvon Char, MD (Dermatology) Anell Barr, OD (Optometry) Norman, Romilda Garret, DPM as Consulting Physician (Podiatry)  Indicate any recent Medical Services you may have received from other than [facility] providers in the past year (date may be approximate).     Assessment:   This is a routine wellness examination for [place].  Hearing/Vision screen No exam data present  Dietary issues and exercise activities discussed: Current Exercise Habits: The patient does not participate in regular exercise at present, Exercise limited by: None identified  Goals      Patient Stated   .  "I'm doing well I think" (pt-stated)      Current Barriers:  . High pill burden . adherence  Pharmacist Clinical Goal(s):  Over the next 90 days, patient will continue to take all medications as prescribed, with adherence indicated by patient report and pharmacy fill history  Interventions: . Comprehensive medication review . Updated EMR medication list . Emphasized importance of high blood pressure and cholesterol medications . Counseled patient on checking BP and blood glucose at home using home monitors (available through [place])  Patient Self Care Activities:  . Self administers medications as prescribed . Attends all scheduled provider appointments . Calls pharmacy for  medication refills . Calls provider office for new concerns or questions  Initial goal documentation       Other   .  DIET - INCREASE WATER INTAKE      Recommend to drink at least 6-8 8oz glasses of water per day.  Depression Screen PHQ 2/9 Scores 11/09/2019 11/02/2018 08/28/2017 06/12/2016 01/10/2015  PHQ - 2 Score 0 0 0 0 0  PHQ- 9 Score 0 - - 0 -    Fall Risk Fall Risk  12/08/2019 11/09/2019 11/02/2018 08/28/2017 06/12/2016  Falls in the past year? 0 0 0 No No  Number falls in past yr: 0 0 0 - -  Injury with Fall? 0 0 0 - -  Follow up - Falls evaluation completed Falls evaluation completed - -    Any stairs in or around the home? No  If so, are there any without handrails? No  Home free of loose throw rugs in walkways, pet beds, electrical cords, etc? Yes  Adequate lighting in your home to reduce risk of falls? Yes   ASSISTIVE DEVICES UTILIZED TO PREVENT FALLS:  Life alert? No  Use of a cane, walker or w/c? No  Grab bars in the bathroom? Yes  Shower chair or bench in shower? Yes  Elevated toilet seat or a handicapped toilet? Yes    Cognitive Function:     6CIT Screen 12/08/2019  What Year? 0 points  What month? 0 points  What time? 0 points  Count back from 20 0 points  Months in reverse 0 points  Repeat phrase 2 points  Total Score 2    Immunizations Immunization History  Administered Date(s) Administered  . Fluad Quad(high Dose 65+) 11/02/2018, 10/11/2019  . Influenza, High Dose Seasonal PF 10/06/2014, 10/05/2015, 10/26/2016, 09/23/2017  . PFIZER SARS-COV-2 Vaccination 02/23/2019, 03/16/2019  . Pneumococcal Conjugate-13 04/28/2013  . Pneumococcal Polysaccharide-23 10/26/2011    TDAP status: Due, Education has been provided regarding the importance of this vaccine. Advised may receive this vaccine at local pharmacy or Health Dept. Aware to provide a copy of the vaccination record if obtained from local pharmacy or Health Dept. Verbalized acceptance and  understanding. Flu Vaccine status: Up to date Pneumococcal vaccine status: Up to date Covid-19 vaccine status: Completed vaccines  Qualifies for Shingles Vaccine? Yes   Zostavax completed No   Shingrix Completed?: No.    Education has been provided regarding the importance of this vaccine. Patient has been advised to call insurance company to determine out of pocket expense if they have not yet received this vaccine. Advised may also receive vaccine at local pharmacy or Health Dept. Verbalized acceptance and understanding.  Screening Tests Health Maintenance  Topic Date Due  . OPHTHALMOLOGY EXAM  08/10/2016  . DEXA SCAN  12/07/2020 (Originally 03/14/2018)  . TETANUS/TDAP  12/07/2020 (Originally 10/03/1953)  . HEMOGLOBIN A1C  04/09/2020  . FOOT EXAM  08/17/2020  . INFLUENZA VACCINE  Completed  . COVID-19 Vaccine  Completed  . PNA vac Low Risk Adult  Completed    Health Maintenance  Health Maintenance Due  Topic Date Due  . OPHTHALMOLOGY EXAM  08/10/2016    Colorectal cancer screening: No longer required.  Mammogram status: No longer required.  Bone Density status: Currently due. Declined referral today.   Lung Cancer Screening: (Low Dose CT Chest recommended if Age 62-80 years, 30 pack-year currently smoking OR have quit w/in 15years.) does not qualify.    Additional Screening:  Vision Screening: Recommended annual ophthalmology exams for early detection of glaucoma and other disorders of the eye. Is the patient up to date with their annual eye exam?  Yes  Who is the provider or what is the name of the office in which the patient attends annual eye exams? Dr Ellin Mayhew If pt is not established  with a provider, would they like to be referred to a provider to establish care? No .   Dental Screening: Recommended annual dental exams for proper oral hygiene  Community Resource Referral / Chronic Care Management: CRR required this visit?  No   CCM required this visit?  No        Plan:     I have personally reviewed and noted the following in the patient's chart:   . Medical and social history . Use of alcohol, tobacco or illicit drugs  . Current medications and supplements . Functional ability and status . Nutritional status . Physical activity . Advanced directives . List of other physicians . Hospitalizations, surgeries, and ER visits in previous 12 months . Vitals . Screenings to include cognitive, depression, and falls . Referrals and appointments  In addition, I have reviewed and discussed with patient certain preventive protocols, quality metrics, and best practice recommendations. A written personalized care plan for preventive services as well as general preventive health recommendations were provided to patient.     Jacquiline Zurcher [place], [place]   00/92/3300   Nurse Notes: Pt has an eye exam scheduled for 02/2020. Pt declined a DEXA scan referral today.

## 2019-12-08 ENCOUNTER — Other Ambulatory Visit: Payer: Self-pay

## 2019-12-08 ENCOUNTER — Other Ambulatory Visit: Payer: Self-pay | Admitting: Family Medicine

## 2019-12-08 ENCOUNTER — Ambulatory Visit (INDEPENDENT_AMBULATORY_CARE_PROVIDER_SITE_OTHER): Payer: Medicare HMO

## 2019-12-08 DIAGNOSIS — Z0000 Encounter for general adult medical examination without abnormal findings: Secondary | ICD-10-CM

## 2019-12-08 DIAGNOSIS — I10 Essential (primary) hypertension: Secondary | ICD-10-CM

## 2019-12-08 NOTE — Patient Instructions (Signed)
Natalie Bray , Thank you for taking time to come for your Medicare Wellness Visit. I appreciate your ongoing commitment to your health goals. Please review the following plan we discussed and let me know if I can assist you in the future.   Screening recommendations/referrals: Colonoscopy: No longer required.  Mammogram: No longer required.  Bone Density: Currently due, declined referral today.  Recommended yearly ophthalmology/optometry visit for glaucoma screening and checkup Recommended yearly dental visit for hygiene and checkup  Vaccinations: Influenza vaccine: Done 10/11/19 Pneumococcal vaccine: Completed series Tdap vaccine: Currently due, declined at this time. Shingles vaccine: Shingrix discussed. Please contact your pharmacy for coverage information.      Advanced directives: Please bring a copy of your POA (Power of Attorney) and/or Living Will to your next appointment.   Conditions/risks identified: Recommend to drink at least 6-8 8oz glasses of water per day.  Next appointment: 02/08/20 @ 8:00 AM with Dr Caryn Section. Declined scheduling an AWV call for 2022 at this time.    Preventive Care 83 Years and Older, Female Preventive care refers to lifestyle choices and visits with your health care provider that can promote health and wellness. What does preventive care include?  A yearly physical exam. This is also called an annual well check.  Dental exams once or twice a year.  Routine eye exams. Ask your health care provider how often you should have your eyes checked.  Personal lifestyle choices, including:  Daily care of your teeth and gums.  Regular physical activity.  Eating a healthy diet.  Avoiding tobacco and drug use.  Limiting alcohol use.  Practicing safe sex.  Taking low-dose aspirin every day.  Taking vitamin and mineral supplements as recommended by your health care provider. What happens during an annual well check? The services and screenings done  by your health care provider during your annual well check will depend on your age, overall health, lifestyle risk factors, and family history of disease. Counseling  Your health care provider may ask you questions about your:  Alcohol use.  Tobacco use.  Drug use.  Emotional well-being.  Home and relationship well-being.  Sexual activity.  Eating habits.  History of falls.  Memory and ability to understand (cognition).  Work and work Statistician.  Reproductive health. Screening  You may have the following tests or measurements:  Height, weight, and BMI.  Blood pressure.  Lipid and cholesterol levels. These may be checked every 5 years, or more frequently if you are over 83 years old.  Skin check.  Lung cancer screening. You may have this screening every year starting at age 83 if you have a 30-pack-year history of smoking and currently smoke or have quit within the past 15 years.  Fecal occult blood test (FOBT) of the stool. You may have this test every year starting at age 83.  Flexible sigmoidoscopy or colonoscopy. You may have a sigmoidoscopy every 5 years or a colonoscopy every 10 years starting at age 83.  Hepatitis C blood test.  Hepatitis B blood test.  Sexually transmitted disease (STD) testing.  Diabetes screening. This is done by checking your blood sugar (glucose) after you have not eaten for a while (fasting). You may have this done every 1-3 years.  Bone density scan. This is done to screen for osteoporosis. You may have this done starting at age 83.  Mammogram. This may be done every 1-2 years. Talk to your health care provider about how often you should have regular mammograms. Talk with  your health care provider about your test results, treatment options, and if necessary, the need for more tests. Vaccines  Your health care provider may recommend certain vaccines, such as:  Influenza vaccine. This is recommended every year.  Tetanus,  diphtheria, and acellular pertussis (Tdap, Td) vaccine. You may need a Td booster every 10 years.  Zoster vaccine. You may need this after age 83.  Pneumococcal 13-valent conjugate (PCV13) vaccine. One dose is recommended after age 83.  Pneumococcal polysaccharide (PPSV23) vaccine. One dose is recommended after age 83. Talk to your health care provider about which screenings and vaccines you need and how often you need them. This information is not intended to replace advice given to you by your health care provider. Make sure you discuss any questions you have with your health care provider. Document Released: 02/03/2015 Document Revised: 09/27/2015 Document Reviewed: 11/08/2014 Elsevier Interactive Patient Education  2017 [place] Prevention in the Home Falls can cause injuries. They can happen to people of all ages. There are many things you can do to make your home safe and to help prevent falls. What can I do on the outside of my home?  Regularly fix the edges of walkways and driveways and fix any cracks.  Remove anything that might make you trip as you walk through a door, such as a raised step or threshold.  Trim any bushes or trees on the path to your home.  Use bright outdoor lighting.  Clear any walking paths of anything that might make someone trip, such as rocks or tools.  Regularly check to see if handrails are loose or broken. Make sure that both sides of any steps have handrails.  Any raised decks and porches should have guardrails on the edges.  Have any leaves, snow, or ice cleared regularly.  Use sand or salt on walking paths during winter.  Clean up any spills in your garage right away. This includes oil or grease spills. What can I do in the bathroom?  Use night lights.  Install grab bars by the toilet and in the tub and shower. Do not use towel bars as grab bars.  Use non-skid mats or decals in the tub or shower.  If you need to sit down in  the shower, use a plastic, non-slip stool.  Keep the floor dry. Clean up any water that spills on the floor as soon as it happens.  Remove soap buildup in the tub or shower regularly.  Attach bath mats securely with double-sided non-slip rug tape.  Do not have throw rugs and other things on the floor that can make you trip. What can I do in the bedroom?  Use night lights.  Make sure that you have a light by your bed that is easy to reach.  Do not use any sheets or blankets that are too big for your bed. They should not hang down onto the floor.  Have a firm chair that has side arms. You can use this for support while you get dressed.  Do not have throw rugs and other things on the floor that can make you trip. What can I do in the kitchen?  Clean up any spills right away.  Avoid walking on wet floors.  Keep items that you use a lot in easy-to-reach places.  If you need to reach something above you, use a strong step stool that has a grab bar.  Keep electrical cords out of the way.  Do not  use floor polish or wax that makes floors slippery. If you must use wax, use non-skid floor wax.  Do not have throw rugs and other things on the floor that can make you trip. What can I do with my stairs?  Do not leave any items on the stairs.  Make sure that there are handrails on both sides of the stairs and use them. Fix handrails that are broken or loose. Make sure that handrails are as long as the stairways.  Check any carpeting to make sure that it is firmly attached to the stairs. Fix any carpet that is loose or worn.  Avoid having throw rugs at the top or bottom of the stairs. If you do have throw rugs, attach them to the floor with carpet tape.  Make sure that you have a light switch at the top of the stairs and the bottom of the stairs. If you do not have them, ask someone to add them for you. What else can I do to help prevent falls?  Wear shoes that:  Do not have high  heels.  Have rubber bottoms.  Are comfortable and fit you well.  Are closed at the toe. Do not wear sandals.  If you use a stepladder:  Make sure that it is fully opened. Do not climb a closed stepladder.  Make sure that both sides of the stepladder are locked into place.  Ask someone to hold it for you, if possible.  Clearly mark and make sure that you can see:  Any grab bars or handrails.  First and last steps.  Where the edge of each step is.  Use tools that help you move around (mobility aids) if they are needed. These include:  Canes.  Walkers.  Scooters.  Crutches.  Turn on the lights when you go into a dark area. Replace any light bulbs as soon as they burn out.  Set up your furniture so you have a clear path. Avoid moving your furniture around.  If any of your floors are uneven, fix them.  If there are any pets around you, be aware of where they are.  Review your medicines with your doctor. Some medicines can make you feel dizzy. This can increase your chance of falling. Ask your doctor what other things that you can do to help prevent falls. This information is not intended to replace advice given to you by your health care provider. Make sure you discuss any questions you have with your health care provider. Document Released: 11/03/2008 Document Revised: 06/15/2015 Document Reviewed: 02/11/2014 Elsevier Interactive Patient Education  2017 [employer].

## 2019-12-14 DIAGNOSIS — C44519 Basal cell carcinoma of skin of other part of trunk: Secondary | ICD-10-CM | POA: Diagnosis not present

## 2019-12-28 ENCOUNTER — Other Ambulatory Visit: Payer: Self-pay | Admitting: Family Medicine

## 2020-01-10 ENCOUNTER — Other Ambulatory Visit: Payer: Self-pay | Admitting: Family Medicine

## 2020-01-10 DIAGNOSIS — I10 Essential (primary) hypertension: Secondary | ICD-10-CM

## 2020-01-10 NOTE — Telephone Encounter (Signed)
Medication Refill - Medication: lisinopril, lancets Has the patient contacted their pharmacy? yes (Agent: If no, request that the patient contact the pharmacy for the refill.) (Agent: If yes, when and what did the pharmacy advise?)Contact PCP  Preferred Pharmacy (with phone number or street name):  [facility], [place] Phone:  [phone]  Fax:  [phone]       Agent: Please be advised that RX refills may take up to 3 business days. We ask that you follow-up with your pharmacy.

## 2020-01-10 NOTE — Telephone Encounter (Signed)
Please review the request below. Lancets were previously prescribed by a historical provider. Lisinopril is not on patient's current med list.

## 2020-01-11 MED ORDER — TRUEPLUS LANCETS 33G MISC: 1.0000 | Freq: Every day | 3 refills | Status: DC

## 2020-01-12 ENCOUNTER — Other Ambulatory Visit: Payer: Self-pay | Admitting: Family Medicine

## 2020-02-08 ENCOUNTER — Ambulatory Visit: Payer: Self-pay | Admitting: Family Medicine

## 2020-02-11 ENCOUNTER — Ambulatory Visit: Payer: Self-pay | Admitting: Family Medicine

## 2020-02-22 ENCOUNTER — Ambulatory Visit (INDEPENDENT_AMBULATORY_CARE_PROVIDER_SITE_OTHER): Payer: Medicare HMO | Admitting: Family Medicine

## 2020-02-22 ENCOUNTER — Encounter: Payer: Self-pay | Admitting: Family Medicine

## 2020-02-22 ENCOUNTER — Other Ambulatory Visit: Payer: Self-pay

## 2020-02-22 ENCOUNTER — Other Ambulatory Visit: Payer: Self-pay | Admitting: Family Medicine

## 2020-02-22 VITALS — BP 146/55 | HR 74 | Wt 162.0 lb

## 2020-02-22 DIAGNOSIS — E1122 Type 2 diabetes mellitus with diabetic chronic kidney disease: Secondary | ICD-10-CM | POA: Diagnosis not present

## 2020-02-22 DIAGNOSIS — N181 Chronic kidney disease, stage 1: Secondary | ICD-10-CM

## 2020-02-22 DIAGNOSIS — R202 Paresthesia of skin: Secondary | ICD-10-CM

## 2020-02-22 DIAGNOSIS — I10 Essential (primary) hypertension: Secondary | ICD-10-CM | POA: Diagnosis not present

## 2020-02-22 DIAGNOSIS — R200 Anesthesia of skin: Secondary | ICD-10-CM | POA: Diagnosis not present

## 2020-02-22 LAB — POCT GLYCOSYLATED HEMOGLOBIN (HGB A1C): Hemoglobin A1C: 6.4 % — AB (ref 4.0–5.6)

## 2020-02-22 MED ORDER — SPIRONOLACTONE 25 MG PO TABS: 25.0000 mg | ORAL_TABLET | Freq: Every day | ORAL | 3 refills | Status: DC

## 2020-02-22 NOTE — Progress Notes (Signed)
Established patient visit   Patient: Natalie Bray   DOB: 03/25/1934   86 y.o. Female  MRN: [id number] Visit Date: 02/22/2020  Today's healthcare provider: Lelon Huh, MD   Chief Complaint  Patient presents with  . Diabetes  . Hypertension   Subjective    HPI  She also reports that she has had occasional numbness in her right hand the last several weeks, seems to radiate down her arm. Sometimes exacerbated by head, neck, and shoulder movements. Can occur when sitting or lying, sometimes when driving.   Diabetes Mellitus Type II, follow-up  Lab Results  Component Value Date   HGBA1C 6.7 (A) 10/11/2019   HGBA1C 6.5 (A) 05/28/2019   HGBA1C 6.6 (A) 06/24/2018   Last seen for diabetes 4 months ago.  Management since then includes continuing the same treatment. She reports excellent compliance with treatment. She is not having side effects.   Home blood sugar records: trend: stable  Episodes of hypoglycemia? Yes occastionally   Current insulin regiment: none Most Recent Eye Exam: 02/2019, pt has an appointment 03/2020  --------------------------------------------------------------------------------------------------- Hypertension, follow-up  BP Readings from Last 3 Encounters:  02/22/20 (!) 146/55  11/09/19 (!) 112/54  10/11/19 113/64   Wt Readings from Last 3 Encounters:  02/22/20 162 lb (73.5 kg)  11/09/19 159 lb (72.1 kg)  10/11/19 157 lb (71.2 kg)     She was last seen for hypertension 4 months ago.  BP at that visit was 113/64. Management since that visit includes no changes. She reports excellent compliance with treatment. She is not having side effects.  She is not exercising. She is adherent to low salt diet.   Outside blood pressures are usually in the 130s.  She does not smoke.  Use of agents associated with hypertension: none.    --------------------------------------------------------------------------------------------------- Lipid/Cholesterol, follow-up  Last Lipid Panel: Lab Results  Component Value Date   CHOL 184 05/28/2019   LDLCALC 81 05/28/2019   HDL 55 05/28/2019   TRIG 293 (H) 05/28/2019    She was last seen for this 8 months ago.  Management since that visit includes no changes.  She reports excellent compliance with treatment. She is not having side effects.   Symptoms: No appetite changes No foot ulcerations  No chest pain No chest pressure/discomfort  No dyspnea No orthopnea  No fatigue No lower extremity edema  No palpitations No paroxysmal nocturnal dyspnea  No nausea Yes numbness or tingling of extremity  No polydipsia No polyuria  No speech difficulty No syncope    Last metabolic panel Lab Results  Component Value Date   GLUCOSE 123 (H) 05/28/2019   NA 138 05/28/2019   K 4.7 05/28/2019   BUN 16 05/28/2019   CREATININE 0.90 05/28/2019   GFRNONAA 60 05/28/2019   GFRAA 69 05/28/2019   CALCIUM 10.7 (H) 05/28/2019   AST 16 05/28/2019   ALT 15 05/28/2019   The ASCVD Risk score (Goff DC Jr., et al., 2013) failed to calculate for the following reasons:   The 2013 ASCVD risk score is only valid for ages 82 to 25  ---------------------------------------------------------------------------------------------------     Medications: Outpatient Medications Prior to Visit  Medication Sig  . amLODipine (NORVASC) 10 MG tablet TAKE 1 TABLET EVERY DAY  . aspirin 81 MG chewable tablet Chew 81 mg by mouth daily.  . calcium-vitamin D (OSCAL WITH D) 500-200 MG-UNIT tablet Take 1 tablet by mouth daily.  . Cholecalciferol (VITAMIN D3) 1000 units CAPS Take by mouth.  Marland Kitchen  cloNIDine (CATAPRES) 0.1 MG tablet Take one tablet as needed for blood pressure over 180. May repeat in 30 minutes. Do not take more than 3 in a day  . docusate sodium (COLACE) 100 MG capsule Take 100 mg by mouth daily.  .  famotidine (PEPCID) 40 MG tablet Take 1 tablet (40 mg total) by mouth every evening.  Marland Kitchen FLUoxetine (PROZAC) 10 MG capsule Take 1 capsule (10 mg total) by mouth daily.  Marland Kitchen glipiZIDE (GLUCOTROL XL) 5 MG 24 hr tablet TAKE 1 TABLET EVERY DAY WITH BREAKFAST  . glucose blood (TRUE METRIX BLOOD GLUCOSE TEST) test strip Use as instructed  . lovastatin (MEVACOR) 40 MG tablet TAKE 1 TABLET EVERY DAY  . metFORMIN (GLUCOPHAGE) 1000 MG tablet TAKE 1 TABLET (1,000 MG TOTAL) BY MOUTH 2 (TWO) TIMES DAILY.  . montelukast (SINGULAIR) 10 MG tablet Take 1 tablet (10 mg total) by mouth at bedtime.  . Omega-3 1000 MG CAPS Take 1 capsule by mouth daily.   Marland Kitchen omeprazole (PRILOSEC) 20 MG capsule TAKE 1 CAPSULE EVERY DAY  . spironolactone (ALDACTONE) 25 MG tablet TAKE 1 TABLET EVERY DAY  . traZODone (DESYREL) 100 MG tablet Take 1 tablet (100 mg total) by mouth at bedtime.  . TRUEplus Lancets 33G MISC 1 each by Other route daily.  . valsartan-hydrochlorothiazide (DIOVAN-HCT) 160-25 MG tablet TAKE 1 TABLET EVERY DAY  . vitamin E 180 MG (400 UNITS) capsule Take 1 capsule by mouth daily.  . Blood Glucose Monitoring Suppl (ACCU-CHEK AVIVA PLUS) w/Device KIT  (Patient not taking: Reported on 02/22/2020)   No facility-administered medications prior to visit.    Review of Systems  Constitutional: Negative.   Respiratory: Negative.   Cardiovascular: Negative.   Gastrointestinal: Negative.   Skin: Negative for wound.  Neurological: Positive for numbness (Right hand only). Negative for dizziness and headaches.     Objective    BP (!) 146/55 (BP Location: Right Arm, Patient Position: Sitting, Cuff Size: Large)   Pulse 74   Wt 162 lb (73.5 kg)   SpO2 100%   BMI 26.15 kg/m    Physical Exam   General: Appearance:     Overweight female in no acute distress  Eyes:    PERRL, conjunctiva/corneas clear, EOM's intact       Lungs:     Clear to auscultation bilaterally, respirations unlabored  Heart:    Normal heart rate.  Normal rhythm. No murmurs, rubs, or gallops. No carotid bruits.   MS:   All extremities are intact.   Neurologic:   Awake, alert, oriented x 3. No apparent focal neurological           defect.        Results for orders placed or performed in visit on 02/22/20  POCT glycosylated hemoglobin (Hb A1C)  Result Value Ref Range   Hemoglobin A1C 6.4 (A) 4.0 - 5.6 %    Assessment & Plan     1. Primary hypertension Home blood pressure are well controlled. Continue current medications.  Is scheduled for routine eye exam.   2. Type 2 diabetes mellitus with stage 1 chronic kidney disease, without long-term current use of insulin (HCC) Well controlled. Continue current medications.    3. Numbness and tingling of right hand C/w intermittent peripheral nerve impingement. She did have MRA in 2020 revealing left PCA stenosis, but preserved distal flow. Ischemia in it's distribution would not affect upper extremities. Consider neurology referral if it persists.    Future Appointments  Date Time Provider  [place]  06/21/2020  8:20 AM Caryn Section, Kirstie Peri, MD [facility]      The entirety of the information documented in the History of Present Illness, Review of Systems and Physical Exam were personally obtained by me. Portions of this information were initially documented by the CMA and reviewed by me for thoroughness and accuracy.      Lelon Huh, MD  [facility] [phone] (phone) [phone] (fax)  [place]

## 2020-02-29 ENCOUNTER — Telehealth: Payer: Self-pay

## 2020-02-29 DIAGNOSIS — N951 Menopausal and female climacteric states: Secondary | ICD-10-CM | POA: Insufficient documentation

## 2020-02-29 DIAGNOSIS — Z90710 Acquired absence of both cervix and uterus: Secondary | ICD-10-CM | POA: Insufficient documentation

## 2020-02-29 NOTE — Telephone Encounter (Signed)
Copied from [place] [id number]. Topic: Quick Communication - See Telephone Encounter >> Feb 29, 2020 10:01 AM [place] wrote: CRM for notification. See Telephone encounter for: 02/29/20. Pt said came last week to for a regular chk up and told Dr Caryn Section her hand was numb. She said that he checked for blockage and when she got home she made an appt with a specialist @ [facility]. She does not know what kind of specialist, states his Gerald Stabs Gains PA . Pt wants a referral to this PA as her ins has contacted and will not cover   CB please. [phone] Pt states her appt is at 10:30 in the morning need a referral today< told pt referrals are normally done after seeing PCP, but states she saw him, appt denied

## 2020-02-29 NOTE — Telephone Encounter (Signed)
Pt has called back and states now that she has cancelled the appt and does not want another appt for the referral as she thinks she will be ok for a while and does not want to worry about it now. Pt states she has cancelled the appt with [place] for [place] not to pursue again, she will call back when she is ready.  She insist that she needs no FU referral and sorry for the trouble.

## 2020-02-29 NOTE — Telephone Encounter (Signed)
Patient is calling back regarding the referral, states it is urgent and she needs the referral placed as soon as possible. Please advise.

## 2020-03-28 DIAGNOSIS — G5601 Carpal tunnel syndrome, right upper limb: Secondary | ICD-10-CM | POA: Diagnosis not present

## 2020-04-18 ENCOUNTER — Ambulatory Visit: Payer: Self-pay | Admitting: *Deleted

## 2020-04-18 NOTE — Telephone Encounter (Signed)
Pt called with complaints of cough and chest tightness, sneezing, and watery eyes; these symptoms started on 04/17/20 and are worsening; her cough is productive with clear secretions; her chest hurts due to cough; she denies fever; her cough keeps her awake and she has been using cough drops; recommendations made per nurse triage protocol; she verbalized understanding;  decision tree completed; she sees Dr Caryn Section, [facility] and he has no availability until 04/21/20 at 1300; pt accepted this appt and says she will see any provider that can see her prior to this date; pt added to wait list; she can be contacted at [phone]; this is a flip phone; will route to office for notification ;Reason for Disposition . SEVERE coughing spells (e.g., whooping sound after coughing, vomiting after coughing)  Answer Assessment - Initial Assessment Questions 1. ONSET: "When did the cough begin?"      328/22 2. SEVERITY: "How bad is the cough today?"     Cough keeps pt up at night 3. SPUTUM: "Describe the color of your sputum" (none, dry cough; clear, white, yellow, green)     clear 4. HEMOPTYSIS: "Are you coughing up any blood?" If so ask: "How much?" (flecks, streaks, tablespoons, etc.)   no 5. DIFFICULTY BREATHING: "Are you having difficulty breathing?" If Yes, ask: "How bad is it?" (e.g., mild, moderate, severe)    - MILD: No SOB at rest, mild SOB with walking, speaks normally in sentences, can lay down, no retractions, pulse < 100.    - MODERATE: SOB at rest, SOB with minimal exertion and prefers to sit, cannot lie down flat, speaks in phrases, mild retractions, audible wheezing, pulse 100-120.    - SEVERE: Very SOB at rest, speaks in single words, struggling to breathe, sitting hunched forward, retractions, pulse > 120      denies 6. FEVER: "Do you have a fever?" If Yes, ask: "What is your temperature, how was it measured, and when did it start?"    no 7. CARDIAC HISTORY: "Do you have any history of  heart disease?" (e.g., heart attack, congestive heart failure)      htn 8. LUNG HISTORY: "Do you have any history of lung disease?"  (e.g., pulmonary embolus, asthma, emphysema)    no 9. PE RISK FACTORS: "Do you have a history of blood clots?" (or: recent major surgery, recent prolonged travel, bedridden)     no 10. OTHER SYMPTOMS: "Do you have any other symptoms?" (e.g., runny nose, wheezing, chest pain)      Sneezing, watery eyes, chest tightness due to cough 11. PREGNANCY: "Is there any chance you are pregnant?" "When was your last menstrual period?"      no 12. TRAVEL: "Have you traveled out of the country in the last month?" (e.g., travel history, exposures)       no  Protocols used: [place]

## 2020-04-19 DIAGNOSIS — E119 Type 2 diabetes mellitus without complications: Secondary | ICD-10-CM | POA: Diagnosis not present

## 2020-04-19 DIAGNOSIS — Z0100 Encounter for examination of eyes and vision without abnormal findings: Secondary | ICD-10-CM | POA: Diagnosis not present

## 2020-04-19 LAB — HM DIABETES EYE EXAM

## 2020-04-21 ENCOUNTER — Telehealth: Payer: Medicare HMO | Admitting: Family Medicine

## 2020-04-21 DIAGNOSIS — Z0100 Encounter for examination of eyes and vision without abnormal findings: Secondary | ICD-10-CM | POA: Diagnosis not present

## 2020-04-22 ENCOUNTER — Other Ambulatory Visit: Payer: Self-pay | Admitting: Family Medicine

## 2020-04-22 DIAGNOSIS — I10 Essential (primary) hypertension: Secondary | ICD-10-CM

## 2020-04-22 NOTE — Telephone Encounter (Signed)
Requested Prescriptions  Pending Prescriptions Disp Refills  . omeprazole (PRILOSEC) 20 MG capsule [Pharmacy Med Name: OMEPRAZOLE 20 MG Capsule Delayed Release] 90 capsule 0    Sig: TAKE 1 CAPSULE EVERY DAY     Gastroenterology: Proton Pump Inhibitors Passed - 04/22/2020 10:03 AM      Passed - Valid encounter within last 12 months    Recent Outpatient Visits          2 months ago Primary hypertension   [facility] Birdie Sons, MD   5 months ago Gastroesophageal reflux disease, unspecified whether esophagitis present   [facility] Birdie Sons, MD   6 months ago Type 2 diabetes mellitus with stage 1 chronic kidney disease, without long-term current use of insulin [facility])   [facility] Birdie Sons, MD   10 months ago Essential hypertension   [facility] Birdie Sons, MD   10 months ago Cough   [facility] Birdie Sons, MD      Future Appointments            In 2 months Fisher, Kirstie Peri, MD [facility], [facility]           . glipiZIDE (GLUCOTROL XL) 5 MG 24 hr tablet [Pharmacy Med Name: GLIPIZIDE ER 5 MG Tablet Extended Release 24 Hour] 90 tablet 0    Sig: TAKE 1 TABLET EVERY DAY WITH BREAKFAST     Endocrinology:  Diabetes - Sulfonylureas Passed - 04/22/2020 10:03 AM      Passed - HBA1C is between 0 and 7.9 and within 180 days    Hemoglobin A1C  Date Value Ref Range Status  02/22/2020 6.4 (A) 4.0 - 5.6 % Final    Comment:    Average 137   Hgb A1c MFr Bld  Date Value Ref Range Status  05/10/2014 7.0 (A) 4.0 - 6.0 % Final         Passed - Valid encounter within last 6 months    Recent Outpatient Visits          2 months ago Primary hypertension   [facility] Birdie Sons, MD   5 months ago Gastroesophageal reflux disease, unspecified whether esophagitis present   [facility] Birdie Sons, MD   6 months ago Type 2 diabetes mellitus  with stage 1 chronic kidney disease, without long-term current use of insulin ([place])   [facility] Birdie Sons, MD   10 months ago Essential hypertension   [facility] Birdie Sons, MD   10 months ago Cough   [facility] Birdie Sons, MD      Future Appointments            In 2 months Fisher, Kirstie Peri, MD [facility], [facility]           . amLODipine ([place]) 10 MG tablet [Pharmacy Med Name: AMLODIPINE BESYLATE 10 MG Tablet] 90 tablet 0    Sig: TAKE 1 TABLET EVERY DAY     Cardiovascular:  Calcium Channel Blockers Failed - 04/22/2020 10:03 AM      Failed - Last BP in normal range    BP Readings from Last 1 Encounters:  02/22/20 (!) 146/55         Passed - Valid encounter within last 6 months    Recent Outpatient Visits          2 months ago Primary hypertension   [facility] Birdie Sons, MD   5 months ago Gastroesophageal reflux disease, unspecified whether esophagitis present   [facility] Birdie Sons, MD   6 months ago Type 2 diabetes mellitus with stage 1 chronic kidney disease, without long-term current use of insulin [facility])   [facility] Birdie Sons, MD   10 months ago Essential hypertension   [facility] Birdie Sons, MD   10 months ago Cough   [place], MD      Future Appointments            In 2 months Fisher, Kirstie Peri, MD [facility], [facility]           . cloNIDine (CATAPRES) 0.1 MG tablet [Pharmacy Med Name: CLONIDINE HYDROCHLORIDE 0.1 MG Tablet] 30 tablet 1    Sig: TAKE 1 TABLET AS NEEDED FOR BLOOD PRESSURE OVER 180. MAY REPEAT IN 30 MINUTES. DO NOT TAKE MORE THAN 3 IN A DAY     Cardiovascular:  Alpha-2 Agonists Failed - 04/22/2020 10:03 AM      Failed - Last BP in normal range    BP Readings from Last 1 Encounters:  02/22/20 (!) 146/55         Passed - Last Heart  Rate in normal range    Pulse Readings from Last 1 Encounters:  02/22/20 74         Passed - Valid encounter within last 6 months    Recent Outpatient Visits          2 months ago Primary hypertension   [facility] Birdie Sons, MD   5 months ago Gastroesophageal reflux disease, unspecified whether esophagitis present   [facility] Birdie Sons, MD   6 months ago Type 2 diabetes mellitus with stage 1 chronic kidney disease, without long-term current use of insulin [facility])   [facility] Birdie Sons, MD   10 months ago Essential hypertension   [facility] Birdie Sons, MD   10 months ago Cough   [facility] Birdie Sons, MD      Future Appointments            In 2 months Fisher, Kirstie Peri, MD [facility], [place]

## 2020-04-25 DIAGNOSIS — R202 Paresthesia of skin: Secondary | ICD-10-CM | POA: Diagnosis not present

## 2020-04-25 DIAGNOSIS — R200 Anesthesia of skin: Secondary | ICD-10-CM | POA: Diagnosis not present

## 2020-04-27 DIAGNOSIS — X32XXXA Exposure to sunlight, initial encounter: Secondary | ICD-10-CM | POA: Diagnosis not present

## 2020-04-27 DIAGNOSIS — D485 Neoplasm of uncertain behavior of skin: Secondary | ICD-10-CM | POA: Diagnosis not present

## 2020-04-27 DIAGNOSIS — D2261 Melanocytic nevi of right upper limb, including shoulder: Secondary | ICD-10-CM | POA: Diagnosis not present

## 2020-04-27 DIAGNOSIS — L570 Actinic keratosis: Secondary | ICD-10-CM | POA: Diagnosis not present

## 2020-04-27 DIAGNOSIS — L821 Other seborrheic keratosis: Secondary | ICD-10-CM | POA: Diagnosis not present

## 2020-04-27 DIAGNOSIS — D0462 Carcinoma in situ of skin of left upper limb, including shoulder: Secondary | ICD-10-CM | POA: Diagnosis not present

## 2020-04-27 DIAGNOSIS — D2262 Melanocytic nevi of left upper limb, including shoulder: Secondary | ICD-10-CM | POA: Diagnosis not present

## 2020-04-27 DIAGNOSIS — D2271 Melanocytic nevi of right lower limb, including hip: Secondary | ICD-10-CM | POA: Diagnosis not present

## 2020-04-27 DIAGNOSIS — Z85828 Personal history of other malignant neoplasm of skin: Secondary | ICD-10-CM | POA: Diagnosis not present

## 2020-04-27 DIAGNOSIS — C44319 Basal cell carcinoma of skin of other parts of face: Secondary | ICD-10-CM | POA: Diagnosis not present

## 2020-05-01 ENCOUNTER — Other Ambulatory Visit: Payer: Self-pay | Admitting: Family Medicine

## 2020-05-01 DIAGNOSIS — I10 Essential (primary) hypertension: Secondary | ICD-10-CM

## 2020-05-02 DIAGNOSIS — G5601 Carpal tunnel syndrome, right upper limb: Secondary | ICD-10-CM | POA: Insufficient documentation

## 2020-05-02 DIAGNOSIS — R202 Paresthesia of skin: Secondary | ICD-10-CM | POA: Insufficient documentation

## 2020-05-02 DIAGNOSIS — R200 Anesthesia of skin: Secondary | ICD-10-CM | POA: Insufficient documentation

## 2020-05-11 IMAGING — MR MR MRA HEAD W/O CM
1 series · 19 of 48 positions shown · non-contrast
Comparison: None.

CLINICAL DATA: 84-year-old female "hearing heartbeat" and roaring
noise in the right ear for over a year.

EXAM:
MRA HEAD WITHOUT CONTRAST
TECHNIQUE: Angiographic images of the [place] were obtained using MRA
technique without intravenous contrast.

[Series 9: TOF · axial · 0.5mm · 0.41mm/px · z∈[-95,-0]mm · 19 of 205 slices shown]
[im 1/205]
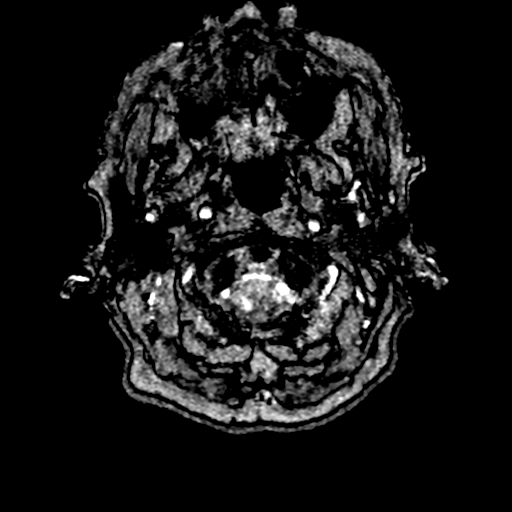
[im 5/205]
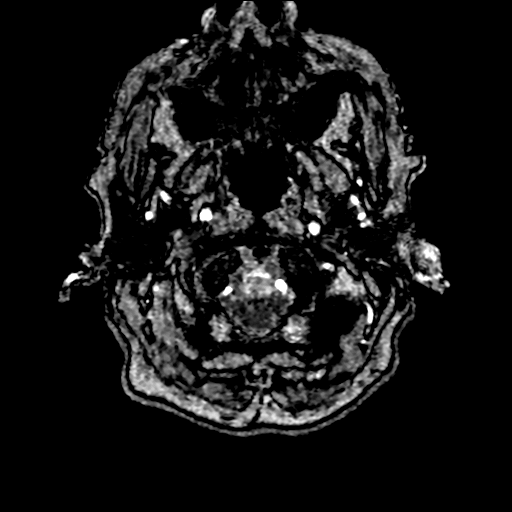
[im 9/205]
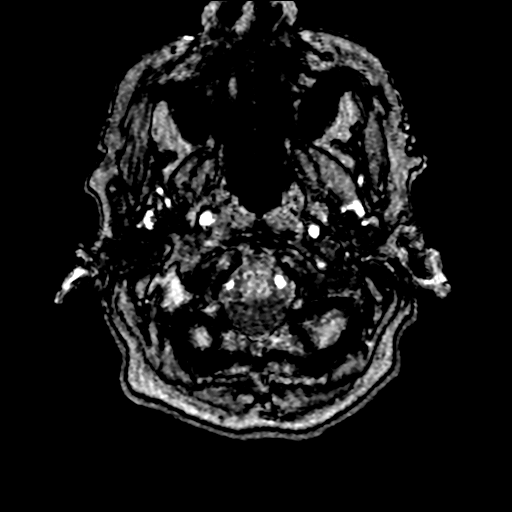
[im 14/205]
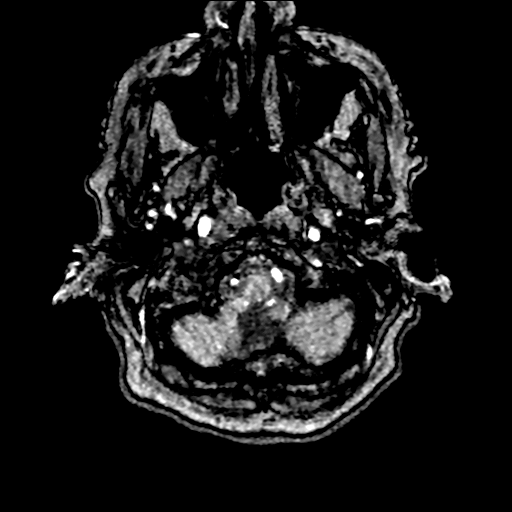
[im 18/205]
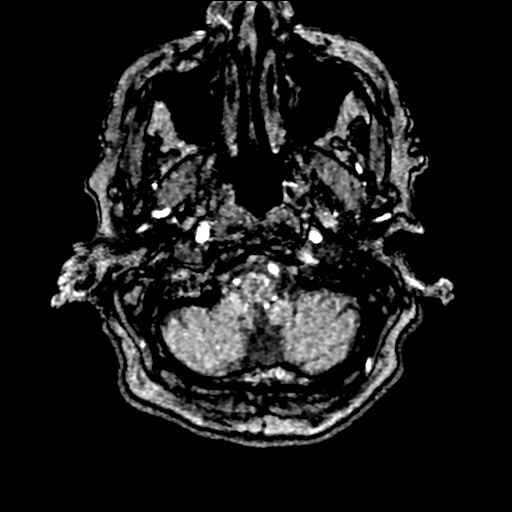
[im 22/205]
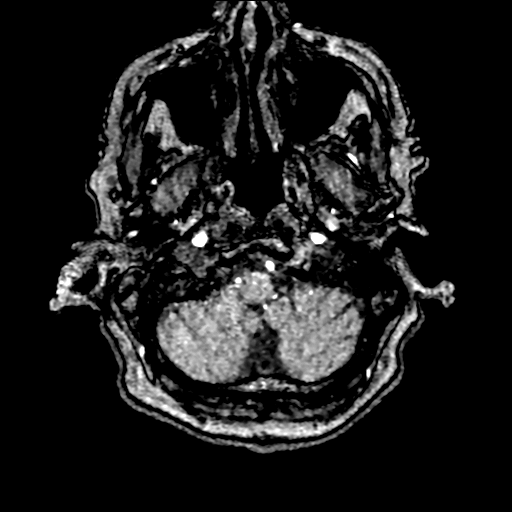
[im 27/205]
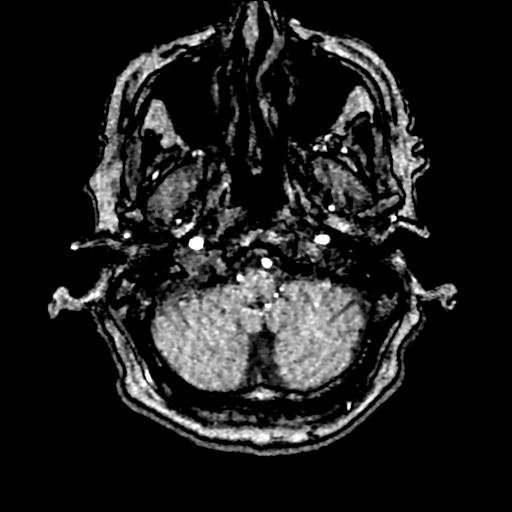
[im 31/205]
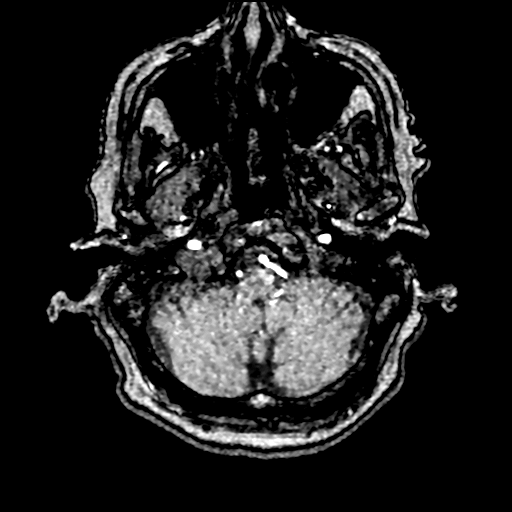
[im 35/205]
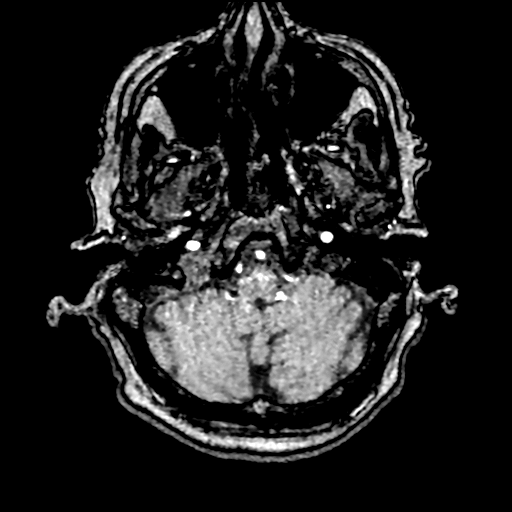
[im 40/205]
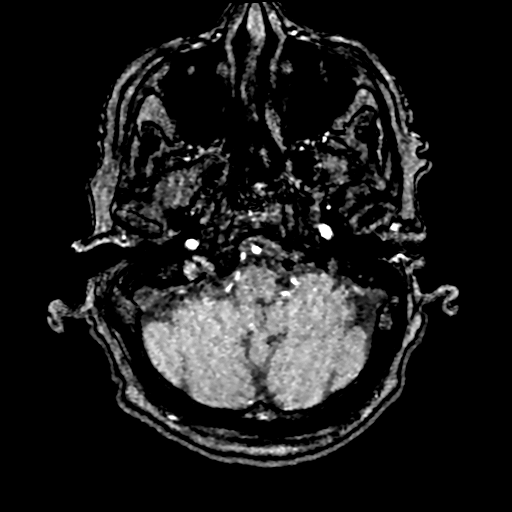
[im 44/205]
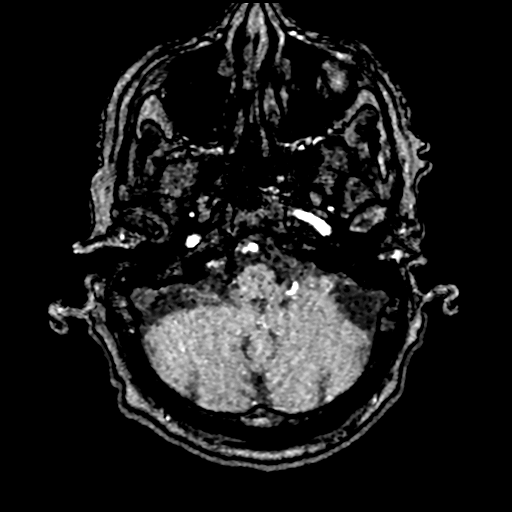
[im 66/205]
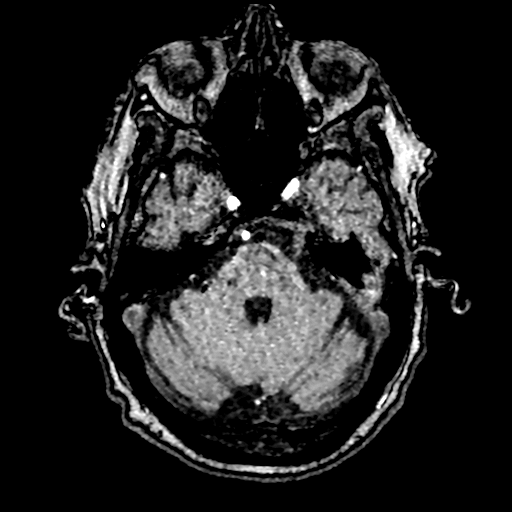
[im 92/205]
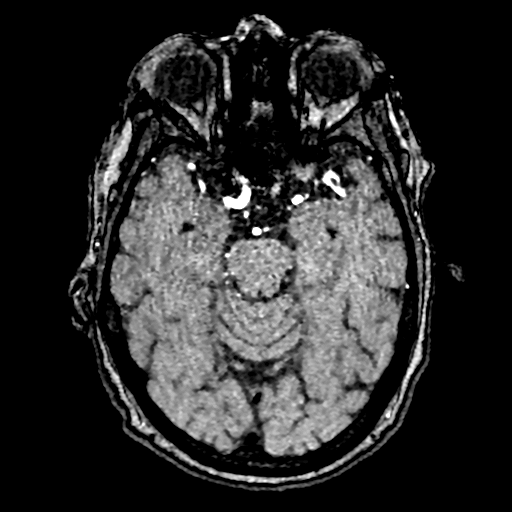
[im 105/205]
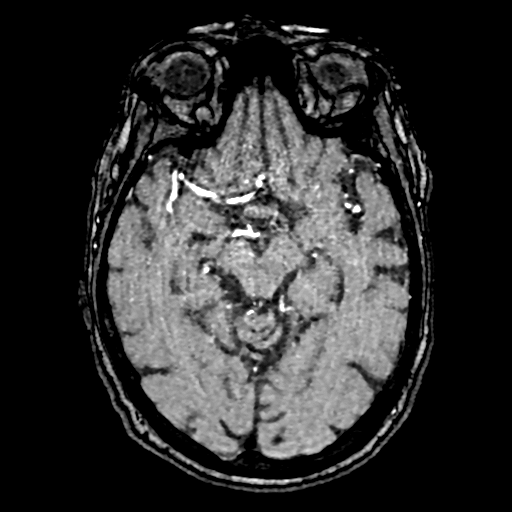
[im 118/205]
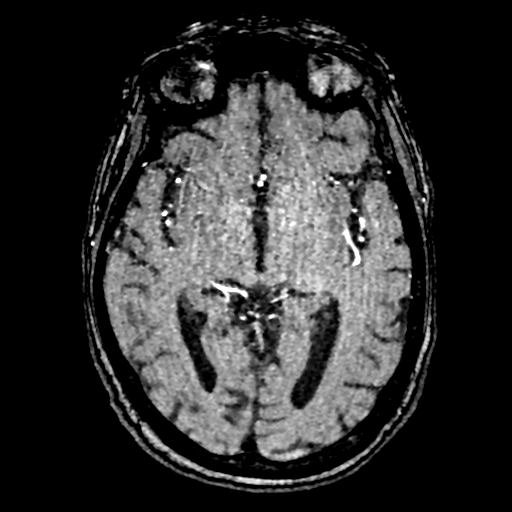
[im 144/205]
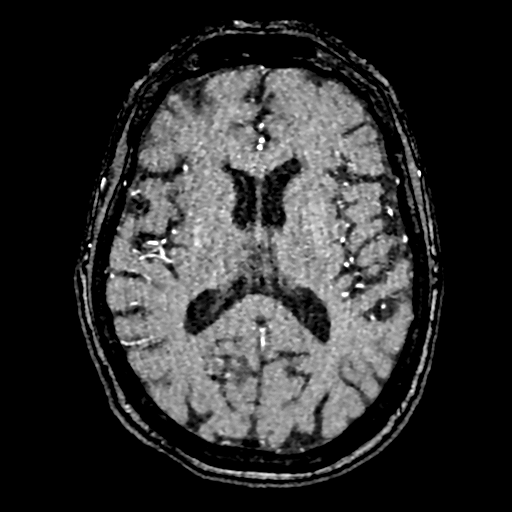
[im 170/205]
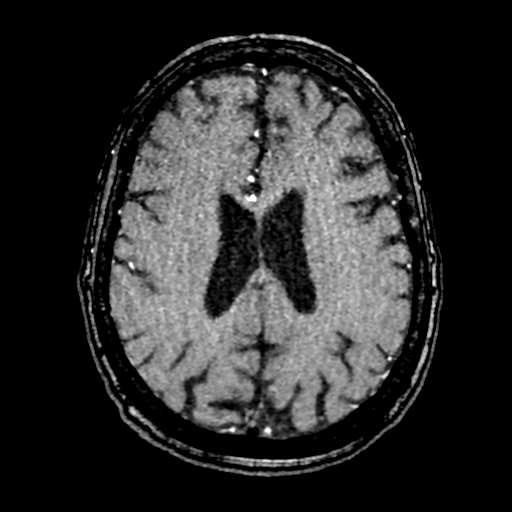
[im 174/205]
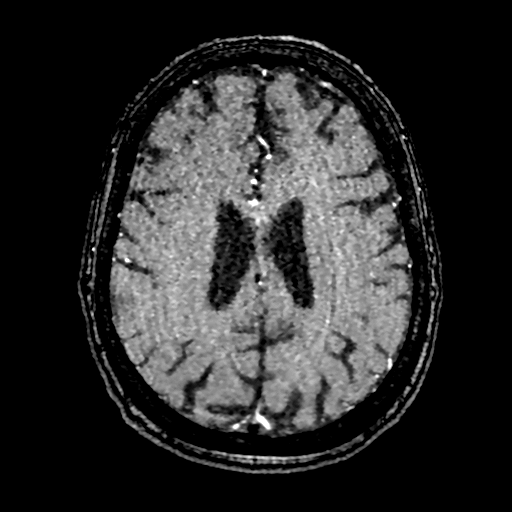
[im 196/205]
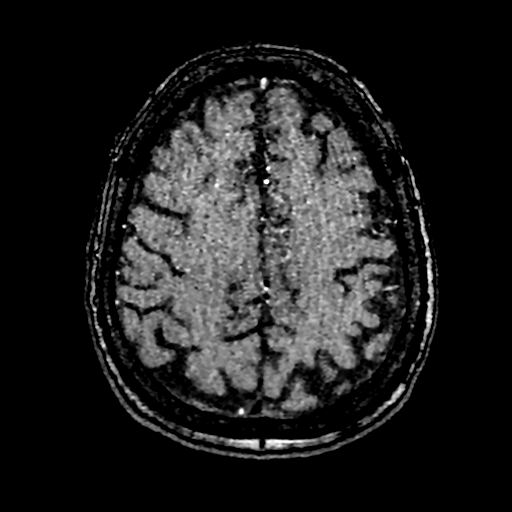

[19 of 48 positions shown; findings below may reference images not displayed]

FINDINGS: No intracranial mass effect or ventriculomegaly. Grossly normal and
symmetric cerebellopontine angles.

Antegrade flow in the posterior circulation with mildly dominant
distal right vertebral artery. Normal PICA origins. No distal
vertebral stenosis. Patent vertebrobasilar junction and basilar
artery without stenosis. Normal SCA and PCA origins. The right
posterior communicating artery is present, the left is diminutive or
absent. Tortuous left P1 segment.

Multifocal severe stenosis of the left PCA P2 (series 9, image 98),
although fairly symmetric bilateral distal PCA branch flow. Right
PCA is within normal limits.

No abnormal flow signal identified about either temporal bone.

Antegrade flow in both ICA siphons. Mild siphon tortuosity. No
siphon stenosis. Ophthalmic and right posterior communicating artery
origins are normal. Patent carotid termini. Normal MCA and ACA
origins. Diminutive or absent anterior communicating artery. Visible
bilateral ACA branches are within normal limits. The right MCA M1
bifurcates early (normal variant series 5747, image 7). Visible
right MCA branches are within normal limits. Left MCA M1 segment and
left MCA trifurcation are patent without stenosis. Visible left MCA
branches are within normal limits.
IMPRESSION: 1. No evidence of vascular malformation, no explanation for right
side tinnitus.
2. Severe Left PCA P2 segment stenoses but preserved distal flow.
3. Otherwise negative posterior circulation.
4. Negative anterior circulation.

## 2020-06-05 DIAGNOSIS — C44319 Basal cell carcinoma of skin of other parts of face: Secondary | ICD-10-CM | POA: Diagnosis not present

## 2020-06-06 ENCOUNTER — Encounter: Payer: Self-pay | Admitting: Certified Nurse Midwife

## 2020-06-06 ENCOUNTER — Other Ambulatory Visit (HOSPITAL_COMMUNITY)
Admission: RE | Admit: 2020-06-06 | Discharge: 2020-06-06 | Disposition: A | Discharge status: Home / Self Care | Payer: Medicare HMO | Source: Ambulatory Visit | Attending: Certified Nurse Midwife | Admitting: Certified Nurse Midwife

## 2020-06-06 ENCOUNTER — Other Ambulatory Visit: Payer: Self-pay

## 2020-06-06 ENCOUNTER — Ambulatory Visit: Payer: Medicare HMO | Admitting: Certified Nurse Midwife

## 2020-06-06 VITALS — BP 165/73 | HR 76 | Resp 16 | Ht 62.0 in | Wt 164.1 lb

## 2020-06-06 DIAGNOSIS — R350 Frequency of micturition: Secondary | ICD-10-CM

## 2020-06-06 DIAGNOSIS — N898 Other specified noninflammatory disorders of vagina: Secondary | ICD-10-CM | POA: Diagnosis not present

## 2020-06-06 LAB — POCT URINALYSIS DIPSTICK OB
Bilirubin, UA: NEGATIVE
Blood, UA: NEGATIVE
Glucose, UA: NEGATIVE
Ketones, UA: NEGATIVE
Leukocytes, UA: NEGATIVE
Nitrite, UA: NEGATIVE
POC,PROTEIN,UA: NEGATIVE
Spec Grav, UA: 1.01 (ref 1.010–1.025)
Urobilinogen, UA: 0.2 E.U./dL
pH, UA: 6.5 (ref 5.0–8.0)

## 2020-06-06 NOTE — Progress Notes (Signed)
GYN ENCOUNTER NOTE  Subjective:       Natalie Bray is a 86 y.o. [id number] female is here for gynecologic evaluation of the following issues:  1. Vaginal itching x 1 month. She states she does have some urinary incontinence that maybe affecting the area. She has used a "vaginal itch cream" to help treat the area which has helped.     Gynecologic History No LMP recorded. Patient has had a hysterectomy. Contraception: status post hysterectomy Last Pap: n/a  Last mammogram: n/a   Obstetric History OB History  Gravida Para Term Preterm AB Living  2 2 2     2   SAB IAB Ectopic Multiple Live Births          2    # Outcome Date GA Lbr Len/2nd Weight Sex Delivery Anes PTL Lv  2 Term 1968    F Vag-Spont   LIV  1 Term 75    F Vag-Spont   LIV    Past Medical History:  Diagnosis Date  . Anemia   . Anemia, iron deficiency 08/08/2014  . Diabetes mellitus without complication ([place])   . History of chicken pox   . History of skin cancer   . Hyperlipidemia   . Hypertension   . Skin cancer     Past Surgical History:  Procedure Laterality Date  . ABDOMINAL HYSTERECTOMY    . Prolapsed bladder  03/2013   Dr. Zigmund Daniel  . REPLACEMENT TOTAL KNEE Left 01/2011   [facility]  . REPLACEMENT TOTAL KNEE Right 03/2010  . TOTAL ABDOMINAL HYSTERECTOMY W/ BILATERAL SALPINGOOPHORECTOMY  1974   [facility]    Current Outpatient Medications on File Prior to Visit  Medication Sig Dispense Refill  . amLODipine (NORVASC) 10 MG tablet TAKE 1 TABLET EVERY DAY 90 tablet 0  . aspirin 81 MG chewable tablet Chew 81 mg by mouth daily.    . calcium-vitamin D (OSCAL WITH D) 500-200 MG-UNIT tablet Take 1 tablet by mouth daily.    . Cholecalciferol (VITAMIN D3) 1000 units CAPS Take by mouth.    . cloNIDine (CATAPRES) 0.1 MG tablet TAKE 1 TABLET AS NEEDED FOR BLOOD PRESSURE OVER 180. MAY REPEAT IN 30 MINUTES. DO NOT TAKE MORE THAN 3 IN A DAY 30 tablet 1  . docusate sodium (COLACE) 100 MG capsule Take 100 mg by mouth daily.     . famotidine (PEPCID) 40 MG tablet Take 1 tablet (40 mg total) by mouth every evening. 30 tablet 1  . FLUoxetine (PROZAC) 10 MG capsule Take 1 capsule (10 mg total) by mouth daily. 90 capsule 2  . glipiZIDE (GLUCOTROL XL) 5 MG 24 hr tablet TAKE 1 TABLET EVERY DAY WITH BREAKFAST 90 tablet 0  . glucose blood (TRUE METRIX BLOOD GLUCOSE TEST) test strip Use as instructed 100 each 12  . lovastatin (MEVACOR) 40 MG tablet TAKE 1 TABLET EVERY DAY 90 tablet 2  . metFORMIN (GLUCOPHAGE) 1000 MG tablet TAKE 1 TABLET (1,000 MG TOTAL) BY MOUTH 2 (TWO) TIMES DAILY. 180 tablet 1  . montelukast (SINGULAIR) 10 MG tablet Take 1 tablet (10 mg total) by mouth at bedtime. 30 tablet 2  . Omega-3 1000 MG CAPS Take 1 capsule by mouth daily.     Marland Kitchen omeprazole (PRILOSEC) 20 MG capsule TAKE 1 CAPSULE EVERY DAY 90 capsule 0  . spironolactone (ALDACTONE) 25 MG tablet Take 1 tablet (25 mg total) by mouth daily. 90 tablet 3  . traZODone (DESYREL) 100 MG tablet Take 1 tablet (100 mg total)  by mouth at bedtime. 30 tablet 2  . TRUEplus Lancets 33G MISC 1 each by Other route daily. 100 each 3  . valsartan-hydrochlorothiazide (DIOVAN-HCT) 160-25 MG tablet TAKE 1 TABLET EVERY DAY 90 tablet 0  . vitamin E 180 MG (400 UNITS) capsule Take 1 capsule by mouth daily.     No current facility-administered medications on file prior to visit.    Allergies  Allergen Reactions  . Celecoxib Rash and Other (See Comments)    Flushed red flushing Flushed red     Social History   Socioeconomic History  . Marital status: Widowed    Spouse name: Not on file  . Number of children: 2  . Years of education: 70  . Highest education level: 12th grade  Occupational History  . Occupation: retired  Tobacco Use  . Smoking status: Never Smoker  . Smokeless tobacco: Never Used  Vaping Use  . Vaping Use: Never used  Substance and Sexual Activity  . Alcohol use: No    Alcohol/week: 0.0 standard drinks  . Drug use: No  . Sexual activity:  Not Currently  Other Topics Concern  . Not on file  Social History Narrative  . Not on file   Social Determinants of Health   Financial Resource Strain: Low Risk   . Difficulty of Paying Living Expenses: Not hard at all  Food Insecurity: No Food Insecurity  . Worried About Charity fundraiser in the Last Year: Never true  . Ran Out of Food in the Last Year: Never true  Transportation Needs: No Transportation Needs  . Lack of Transportation (Medical): No  . Lack of Transportation (Non-Medical): No  Physical Activity: Inactive  . Days of Exercise per Week: 0 days  . Minutes of Exercise per Session: 0 min  Stress: No Stress Concern Present  . Feeling of Stress : Not at all  Social Connections: Moderately Isolated  . Frequency of Communication with Friends and Family: More than three times a week  . Frequency of Social Gatherings with Friends and Family: More than three times a week  . Attends Religious Services: More than 4 times per year  . Active Member of Clubs or Organizations: No  . Attends Archivist Meetings: Never  . Marital Status: Widowed  Intimate Partner Violence: Not At Risk  . Fear of Current or Ex-Partner: No  . Emotionally Abused: No  . Physically Abused: No  . Sexually Abused: No    Family History  Problem Relation Age of Onset  . Kidney failure Mother   . Aneurysm Father   . Hypertension Sister   . Diabetes Sister   . Diabetes Brother   . Hypertension Brother   . Diabetes Sister   . Hypertension Sister   . Diabetes Brother   . Hypertension Brother     The following portions of the patient's history were reviewed and updated as appropriate: allergies, current medications, past family history, past medical history, past social history, past surgical history and problem list.  Review of Systems Review of Systems - Negative except as mentioend in HPI Review of Systems - General ROS: negative for - chills, fatigue, fever, hot flashes, malaise  or night sweats Hematological and Lymphatic ROS: negative for - bleeding problems or swollen lymph nodes Gastrointestinal ROS: negative for - abdominal pain, blood in stools, change in bowel habits and nausea/vomiting Musculoskeletal ROS: negative for - joint pain, muscle pain or muscular weakness Genito-Urinary ROS: negative for - change in menstrual cycle,  dysmenorrhea, dyspareunia, dysuria, genital discharge, genital ulcers, hematuria, incontinence, irregular/heavy menses, nocturia or pelvic pain. Positive for vaginal itching.   Objective:   BP (!) 165/73   Pulse 76   Resp 16   Ht 5\' 2"  (1.575 m)   Wt 164 lb 1.6 oz (74.4 kg)   BMI 30.01 kg/m  CONSTITUTIONAL: Well-developed, well-nourished female in no acute distress.  HENT:  Normocephalic, atraumatic.  NECK: Normal range of motion, supple, no masses.  Normal thyroid.  SKIN: Skin is warm and dry. No rash noted. Not diaphoretic. No erythema. No pallor. [place]: Alert and oriented to person, place, and time. PSYCHIATRIC: Normal mood and affect. Normal behavior. Normal judgment and thought content. CARDIOVASCULAR:Not Examined RESPIRATORY: Not Examined BREASTS: Not Examined ABDOMEN: Soft, non distended; Non tender.  No Organomegaly. PELVIC:  External Genitalia: exteral labia red , mild tenderness , right side just the vaginal.  normal atrophic changes  BUS: Normal  Vagina: Normal   Cervix: absent  Uterus: absent  MUSCULOSKELETAL: Normal range of motion. No tenderness.  No cyanosis, clubbing, or edema.     Assessment:   1. Urinary frequency  - POC Urinalysis Dipstick OB     Plan:   Vaginal swab collected. If negative discussed use of Desitin to decrease irritation due to urine leakage. Also discussed use of steroid cream to treat affected tissue.  She verbalizes and agrees to plan of care.   Philip Aspen, CNM

## 2020-06-08 LAB — CERVICOVAGINAL ANCILLARY ONLY
Bacterial Vaginitis (gardnerella): NEGATIVE
Candida Glabrata: NEGATIVE
Candida Vaginitis: NEGATIVE
Comment: NEGATIVE
Comment: NEGATIVE
Comment: NEGATIVE

## 2020-06-12 ENCOUNTER — Other Ambulatory Visit: Payer: Self-pay | Admitting: Certified Nurse Midwife

## 2020-06-12 DIAGNOSIS — D0462 Carcinoma in situ of skin of left upper limb, including shoulder: Secondary | ICD-10-CM | POA: Diagnosis not present

## 2020-06-12 DIAGNOSIS — C44729 Squamous cell carcinoma of skin of left lower limb, including hip: Secondary | ICD-10-CM | POA: Diagnosis not present

## 2020-06-12 DIAGNOSIS — D485 Neoplasm of uncertain behavior of skin: Secondary | ICD-10-CM | POA: Diagnosis not present

## 2020-06-12 MED ORDER — CLOBETASOL PROPIONATE 0.05 % EX OINT: 1.0000 "application " | TOPICAL_OINTMENT | Freq: Two times a day (BID) | CUTANEOUS | 0 refills | Status: AC

## 2020-06-20 DIAGNOSIS — G5601 Carpal tunnel syndrome, right upper limb: Secondary | ICD-10-CM | POA: Diagnosis not present

## 2020-06-21 ENCOUNTER — Encounter: Payer: Self-pay | Admitting: Family Medicine

## 2020-06-21 ENCOUNTER — Ambulatory Visit (INDEPENDENT_AMBULATORY_CARE_PROVIDER_SITE_OTHER): Payer: Medicare HMO | Admitting: Family Medicine

## 2020-06-21 ENCOUNTER — Other Ambulatory Visit: Payer: Self-pay

## 2020-06-21 VITALS — BP 139/73 | HR 65 | Temp 97.9°F | Resp 16 | Wt 163.0 lb

## 2020-06-21 DIAGNOSIS — E785 Hyperlipidemia, unspecified: Secondary | ICD-10-CM | POA: Diagnosis not present

## 2020-06-21 DIAGNOSIS — E1122 Type 2 diabetes mellitus with diabetic chronic kidney disease: Secondary | ICD-10-CM

## 2020-06-21 DIAGNOSIS — N181 Chronic kidney disease, stage 1: Secondary | ICD-10-CM

## 2020-06-21 DIAGNOSIS — I10 Essential (primary) hypertension: Secondary | ICD-10-CM

## 2020-06-21 NOTE — Progress Notes (Signed)
Established patient visit   Patient: Natalie Bray   DOB: [birth year]   86 y.o. Female  MRN: [id number] Visit Date: 06/21/2020  Today's healthcare provider: Lelon Huh, MD   Chief Complaint  Patient presents with  . Diabetes  . Hypertension   Subjective    HPI  Diabetes Mellitus Type II, Follow-up  Lab Results  Component Value Date   HGBA1C 6.4 (A) 02/22/2020   HGBA1C 6.7 (A) 10/11/2019   HGBA1C 6.5 (A) 05/28/2019   Wt Readings from Last 3 Encounters:  06/21/20 163 lb (73.9 kg)  06/06/20 164 lb 1.6 oz (74.4 kg)  02/22/20 162 lb (73.5 kg)   Last seen for diabetes 3 months ago.  Management since then includes continue same medication. She reports good compliance with treatment. She is not having side effects.  Symptoms: No fatigue No foot ulcerations  No appetite changes No nausea  No paresthesia of the feet  No polydipsia  No polyuria No visual disturbances   No vomiting     Home blood sugar records: varies: 140-170 at night  Episodes of hypoglycemia? No    Current insulin regiment: none Most Recent Eye Exam: 04/19/2020 Current exercise: none Current diet habits: in general, a "healthy" diet    Pertinent Labs: Lab Results  Component Value Date   CHOL 184 05/28/2019   HDL 55 05/28/2019   LDLCALC 81 05/28/2019   TRIG 293 (H) 05/28/2019   CHOLHDL 3.3 05/28/2019   Lab Results  Component Value Date   NA 138 05/28/2019   K 4.7 05/28/2019   CREATININE 0.90 05/28/2019   GFRNONAA 60 05/28/2019   GFRAA 69 05/28/2019   GLUCOSE 123 (H) 05/28/2019     ---------------------------------------------------------------------------------------------------  Hypertension, follow-up  BP Readings from Last 3 Encounters:  06/21/20 139/73  06/06/20 (!) 165/73  02/22/20 (!) 146/55   Wt Readings from Last 3 Encounters:  06/21/20 163 lb (73.9 kg)  06/06/20 164 lb 1.6 oz (74.4 kg)  02/22/20 162 lb (73.5 kg)     She was last seen for hypertension 3 months  ago.  BP at that visit was 146/55. Management since that visit includes continue same medications.  She reports good compliance with treatment. She is not having side effects.  She is following a Regular diet. She is not exercising. She does not smoke.  Use of agents associated with hypertension: NSAIDS.   Outside blood pressures are checked and average 150/ 70. Symptoms: No chest pain No chest pressure  No palpitations No syncope  No dyspnea No orthopnea  No paroxysmal nocturnal dyspnea No lower extremity edema   Pertinent labs: Lab Results  Component Value Date   CHOL 184 05/28/2019   HDL 55 05/28/2019   LDLCALC 81 05/28/2019   TRIG 293 (H) 05/28/2019   CHOLHDL 3.3 05/28/2019   Lab Results  Component Value Date   NA 138 05/28/2019   K 4.7 05/28/2019   CREATININE 0.90 05/28/2019   GFRNONAA 60 05/28/2019   GFRAA 69 05/28/2019   GLUCOSE 123 (H) 05/28/2019     The ASCVD Risk score (Goff DC Jr., et al., 2013) failed to calculate for the following reasons:   The 2013 ASCVD risk score is only valid for ages 56 to 68   ---------------------------------------------------------------------------------------------------     Medications: Outpatient Medications Prior to Visit  Medication Sig  . amLODipine (NORVASC) 10 MG tablet TAKE 1 TABLET EVERY DAY  . aspirin 81 MG chewable tablet Chew 81 mg by mouth daily.  Marland Kitchen  calcium-vitamin D (OSCAL WITH D) 500-200 MG-UNIT tablet Take 1 tablet by mouth daily.  . Cholecalciferol (VITAMIN D3) 1000 units CAPS Take by mouth.  . clobetasol ointment (TEMOVATE) 3.29 % Apply 1 application topically 2 (two) times daily for 14 days. Apply sparingly to affected area 1-2 times daily for 2 weeks.  . cloNIDine (CATAPRES) 0.1 MG tablet TAKE 1 TABLET AS NEEDED FOR BLOOD PRESSURE OVER 180. MAY REPEAT IN 30 MINUTES. DO NOT TAKE MORE THAN 3 IN A DAY  . docusate sodium (COLACE) 100 MG capsule Take 100 mg by mouth daily.  . famotidine (PEPCID) 40 MG tablet  Take 1 tablet (40 mg total) by mouth every evening.  Marland Kitchen FLUoxetine (PROZAC) 10 MG capsule Take 1 capsule (10 mg total) by mouth daily.  Marland Kitchen glipiZIDE (GLUCOTROL XL) 5 MG 24 hr tablet TAKE 1 TABLET EVERY DAY WITH BREAKFAST  . glucose blood (TRUE METRIX BLOOD GLUCOSE TEST) test strip Use as instructed  . lovastatin (MEVACOR) 40 MG tablet TAKE 1 TABLET EVERY DAY  . metFORMIN (GLUCOPHAGE) 1000 MG tablet TAKE 1 TABLET (1,000 MG TOTAL) BY MOUTH 2 (TWO) TIMES DAILY.  . montelukast (SINGULAIR) 10 MG tablet Take 1 tablet (10 mg total) by mouth at bedtime.  . Omega-3 1000 MG CAPS Take 1 capsule by mouth daily.   Marland Kitchen omeprazole (PRILOSEC) 20 MG capsule TAKE 1 CAPSULE EVERY DAY  . spironolactone (ALDACTONE) 25 MG tablet Take 1 tablet (25 mg total) by mouth daily.  . traZODone (DESYREL) 100 MG tablet Take 1 tablet (100 mg total) by mouth at bedtime.  . TRUEplus Lancets 33G MISC 1 each by Other route daily.  . valsartan-hydrochlorothiazide (DIOVAN-HCT) 160-25 MG tablet TAKE 1 TABLET EVERY DAY  . vitamin E 180 MG (400 UNITS) capsule Take 1 capsule by mouth daily.   No facility-administered medications prior to visit.    Review of Systems  Constitutional: Negative for appetite change, chills, fatigue and fever.  Respiratory: Negative for chest tightness and shortness of breath.   Cardiovascular: Negative for chest pain and palpitations.  Gastrointestinal: Negative for abdominal pain, nausea and vomiting.  Neurological: Positive for numbness (in hands). Negative for dizziness and weakness.       Objective    BP 139/73 (BP Location: Left Arm, Patient Position: Sitting, Cuff Size: Large)   Pulse 65   Temp 97.9 F (36.6 C) (Temporal)   Resp 16   Wt 163 lb (73.9 kg)   BMI 29.81 kg/m     Physical Exam   General: Appearance:     Overweight female in no acute distress  Eyes:    PERRL, conjunctiva/corneas clear, EOM's intact       Lungs:     Clear to auscultation bilaterally, respirations unlabored   Heart:    Normal heart rate. Normal rhythm. No murmurs, rubs, or gallops.   MS:   All extremities are intact.   Neurologic:   Awake, alert, oriented x 3. No apparent focal neurological           defect.        Assessment & Plan     1. Type 2 diabetes mellitus with stage 1 chronic kidney disease, without long-term current use of insulin (HCC)  - Hemoglobin A1c  2. Primary hypertension Well controlled.  Continue current medications.   - CBC  3. Hyperlipidemia, unspecified hyperlipidemia type She is tolerating lovastatin well with no adverse effects.   - Comprehensive metabolic panel - Lipid panel  The entirety of the information documented in the History of Present Illness, Review of Systems and Physical Exam were personally obtained by me. Portions of this information were initially documented by the CMA and reviewed by me for thoroughness and accuracy.      Lelon Huh, MD  [facility] [phone] (phone) [phone] (fax)  [place]

## 2020-06-22 LAB — LIPID PANEL
Chol/HDL Ratio: 3.8 ratio (ref 0.0–4.4)
Cholesterol, Total: 237 mg/dL — ABNORMAL HIGH (ref 100–199)
HDL: 62 mg/dL (ref >39)
LDL Chol Calc (NIH): 114 mg/dL — ABNORMAL HIGH (ref 0–99)
Triglycerides: 355 mg/dL — ABNORMAL HIGH (ref 0–149)
VLDL Cholesterol Cal: 61 mg/dL — ABNORMAL HIGH (ref 5–40)

## 2020-06-22 LAB — CBC
Hematocrit: 40.4 % (ref 34.0–46.6)
Hemoglobin: 13.1 g/dL (ref 11.1–15.9)
MCH: 28.5 pg (ref 26.6–33.0)
MCHC: 32.4 g/dL (ref 31.5–35.7)
MCV: 88 fL (ref 79–97)
Platelets: 359 10*3/uL (ref 150–450)
RBC: 4.6 x10E6/uL (ref 3.77–5.28)
RDW: 14.4 % (ref 11.7–15.4)
WBC: 9 10*3/uL (ref 3.4–10.8)

## 2020-06-22 LAB — COMPREHENSIVE METABOLIC PANEL
ALT: 14 IU/L (ref 0–32)
AST: 13 IU/L (ref 0–40)
Albumin/Globulin Ratio: 1.8 (ref 1.2–2.2)
Albumin: 4.7 g/dL — ABNORMAL HIGH (ref 3.6–4.6)
Alkaline Phosphatase: 83 IU/L (ref 44–121)
BUN/Creatinine Ratio: 26 (ref 12–28)
BUN: 22 mg/dL (ref 8–27)
Bilirubin Total: 0.3 mg/dL (ref 0.0–1.2)
CO2: 23 mmol/L (ref 20–29)
Calcium: 10.8 mg/dL — ABNORMAL HIGH (ref 8.7–10.3)
Chloride: 92 mmol/L — ABNORMAL LOW (ref 96–106)
Creatinine, Ser: 0.84 mg/dL (ref 0.57–1.00)
Globulin, Total: 2.6 g/dL (ref 1.5–4.5)
Glucose: 132 mg/dL — ABNORMAL HIGH (ref 65–99)
Potassium: 4.3 mmol/L (ref 3.5–5.2)
Sodium: 134 mmol/L (ref 134–144)
Total Protein: 7.3 g/dL (ref 6.0–8.5)
eGFR: 69 mL/min/{1.73_m2} (ref >59)

## 2020-06-22 LAB — HEMOGLOBIN A1C
Est. average glucose Bld gHb Est-mCnc: 154 mg/dL
Hgb A1c MFr Bld: 7 % — ABNORMAL HIGH (ref 4.8–5.6)

## 2020-06-23 ENCOUNTER — Telehealth: Payer: Self-pay

## 2020-06-23 NOTE — Telephone Encounter (Signed)
-----   Message from Birdie Sons, MD sent at 06/22/2020  6:49 AM EDT ----- A1c is up a little bit to 7.0. cholesterol is up 237, please make sure she is taking lovastatin every day. Try to be more strict with diet. Schedule follow up for diabetes in about 4 months.

## 2020-06-23 NOTE — Telephone Encounter (Signed)
LMTCB 06/23/2020.  PEC please advise pt of lab results when she calls back.   Thanks,   -Mickel Baas

## 2020-06-26 DIAGNOSIS — D0462 Carcinoma in situ of skin of left upper limb, including shoulder: Secondary | ICD-10-CM | POA: Diagnosis not present

## 2020-07-06 DIAGNOSIS — Z20822 Contact with and (suspected) exposure to covid-19: Secondary | ICD-10-CM | POA: Diagnosis not present

## 2020-07-06 DIAGNOSIS — Z03818 Encounter for observation for suspected exposure to other biological agents ruled out: Secondary | ICD-10-CM | POA: Diagnosis not present

## 2020-07-07 ENCOUNTER — Ambulatory Visit: Payer: Self-pay | Admitting: *Deleted

## 2020-07-07 DIAGNOSIS — U071 COVID-19: Secondary | ICD-10-CM

## 2020-07-07 MED ORDER — MOLNUPIRAVIR EUA 200MG CAPSULE: 4.0000 | ORAL_CAPSULE | Freq: Two times a day (BID) | ORAL | 0 refills | Status: AC

## 2020-07-07 NOTE — Telephone Encounter (Signed)
Reason for Disposition  [1] HIGH RISK for severe COVID complications (e.g., weak immune system, age > 80 years, obesity with BMI > 25, pregnant, chronic lung disease or other chronic medical condition) AND [2] COVID symptoms (e.g., cough, fever)  (Exceptions: Already seen by PCP and no new or worsening symptoms.)  Answer Assessment - Initial Assessment Questions 1. COVID-19 DIAGNOSIS: "Who made your COVID-19 diagnosis?" "Was it confirmed by a positive lab test or self-test?" If not diagnosed by a doctor (or NP/PA), ask "Are there lots of cases (community spread) where you live?" Note: See public health department website, if unsure.     Lab test- + COVID 2. COVID-19 EXPOSURE: "Was there any known exposure to COVID before the symptoms began?" CDC Definition of close contact: within 6 feet (2 meters) for a total of 15 minutes or more over a 24-hour period.      Unknown- recent vacation  3. ONSET: "When did the COVID-19 symptoms start?"      Wednesday 4. WORST SYMPTOM: "What is your worst symptom?" (e.g., cough, fever, shortness of breath, muscle aches)     cough 5. COUGH: "Do you have a cough?" If Yes, ask: "How bad is the cough?"       Yes- spells- mostly with talking 6. FEVER: "Do you have a fever?" If Yes, ask: "What is your temperature, how was it measured, and when did it start?"     no 7. RESPIRATORY STATUS: "Describe your breathing?" (e.g., shortness of breath, wheezing, unable to speak)      No problem with breathing 8. BETTER-SAME-WORSE: "Are you getting better, staying the same or getting worse compared to yesterday?"  If getting worse, ask, "In what way?"     Better- patient feels better- does have fatigue 9. HIGH RISK DISEASE: "Do you have any chronic medical problems?" (e.g., asthma, heart or lung disease, weak immune system, obesity, etc.)     Age 86. VACCINE: "Have you had the COVID-19 vaccine?" If Yes, ask: "Which one, how many shots, when did you get it?"       Yes- pfizer  11.  BOOSTER: "Have you received your COVID-19 booster?" If Yes, ask: "Which one and when did you get it?"       no 12. PREGNANCY: "Is there any chance you are pregnant?" "When was your last menstrual period?"       N/a 13. OTHER SYMPTOMS: "Do you have any other symptoms?"  (e.g., chills, fatigue, headache, loss of smell or taste, muscle pain, sore throat)       Sore throat- cough- productive- yellow sputum 14. O2 SATURATION MONITOR:  "Do you use an oxygen saturation monitor (pulse oximeter) at home?" If Yes, ask "What is your reading (oxygen level) today?" "What is your usual oxygen saturation reading?" (e.g., 95%)       No  Protocols used: Coronavirus (COVID-19) Diagnosed or Suspected-A-AH

## 2020-07-07 NOTE — Addendum Note (Signed)
Addended by: Randal Buba on: 07/07/2020 03:32 PM   Modules accepted: Orders

## 2020-07-07 NOTE — Telephone Encounter (Signed)
Patient is calling to report she went on vacation and family members have since tested + COVID. Patient reports she started symptoms on Wednesday and was advised to test. Lab test came back + COVID today. Patient reports overall she feels better than she did Wednesday- she does have productive cough. Due to patient age and risk factors- note sent to office for PCP review and possible treatment options. (She is 89 and does not have access to MyChart- for virtual visit)

## 2020-07-07 NOTE — Addendum Note (Signed)
Addended by: Lelon Huh E on: 07/07/2020 02:10 PM   Modules accepted: Orders

## 2020-07-07 NOTE — Telephone Encounter (Signed)
Please advise 

## 2020-07-07 NOTE — Telephone Encounter (Signed)
Recommend she start mulnupiravir. I'm not sure if [facility] has this in stock. Can call and check before sending prescription. If not then can send to [facility] instead.

## 2020-07-07 NOTE — Telephone Encounter (Signed)
Patient advised and agrees to start medication. Prescription sent into [facility].

## 2020-07-21 ENCOUNTER — Other Ambulatory Visit: Payer: Self-pay | Admitting: Family Medicine

## 2020-07-21 DIAGNOSIS — F5104 Psychophysiologic insomnia: Secondary | ICD-10-CM

## 2020-07-21 DIAGNOSIS — I10 Essential (primary) hypertension: Secondary | ICD-10-CM

## 2020-07-21 DIAGNOSIS — F439 Reaction to severe stress, unspecified: Secondary | ICD-10-CM

## 2020-07-26 ENCOUNTER — Other Ambulatory Visit: Payer: Self-pay

## 2020-07-26 ENCOUNTER — Encounter: Payer: Self-pay | Admitting: Obstetrics and Gynecology

## 2020-07-26 ENCOUNTER — Telehealth: Payer: Self-pay | Admitting: Obstetrics and Gynecology

## 2020-07-26 ENCOUNTER — Ambulatory Visit: Payer: Medicare HMO | Admitting: Obstetrics and Gynecology

## 2020-07-26 VITALS — BP 163/69 | HR 101 | Ht 62.0 in | Wt 165.2 lb

## 2020-07-26 DIAGNOSIS — N3941 Urge incontinence: Secondary | ICD-10-CM | POA: Diagnosis not present

## 2020-07-26 DIAGNOSIS — R351 Nocturia: Secondary | ICD-10-CM

## 2020-07-26 DIAGNOSIS — L292 Pruritus vulvae: Secondary | ICD-10-CM

## 2020-07-26 DIAGNOSIS — C44729 Squamous cell carcinoma of skin of left lower limb, including hip: Secondary | ICD-10-CM | POA: Diagnosis not present

## 2020-07-26 MED ORDER — CLOBETASOL PROPIONATE 0.05 % EX OINT: TOPICAL_OINTMENT | CUTANEOUS | 5 refills | Status: DC

## 2020-07-26 NOTE — Progress Notes (Signed)
Pt c/o vaginal itching and burning x several months. Pt seen AT 06/06/2020 and was prescribed cream for the itching that never arrived to the pharmacy per pt.  Pt stated that she is still having itching and burning and would like to see another provider. Pt requested any medication prescribed today be sent to [facility] in [place].

## 2020-07-26 NOTE — Telephone Encounter (Signed)
Pt called asking to talk to [place] or her nurse- she is wanting detailed instructions for cream. Please Advise.

## 2020-07-26 NOTE — Progress Notes (Signed)
GYNECOLOGY PROGRESS NOTE  Subjective:    Patient ID: Natalie Bray, female    DOB: 08-31-1934, 86 y.o.   MRN: [id number]  HPI  Patient is a 86 y.o. G24P2002 female who presents for complaints of vulvar itching. Has been experiencing itching for several months. Noting white patches on the vulvar region that don't wash away. Was seen by a different provider in the office and notes she was prescribed a cream, but never received it from the pharmacy (unsure if it was called in). Has been trying OTC topical treatments such as Monistat and Vagisil which help a little.   Also complains of nocturia for "a long time".  Notes going to the bathroom every 1.5-3 hours at night.  Does consume fluids in the evening, water or hot chocolate.  Has urgency and leakage when she gets up at night. Has had history of bladder tack x 2 in the past. Denies urinary symptoms in the daytime.  States she was told by her previous provider that her bladder did not appear to be dropping.   The following portions of the patient's history were reviewed and updated as appropriate:  She  has a past medical history of Anemia, Anemia, iron deficiency (08/08/2014), Diabetes mellitus without complication ([employer]), History of chicken pox, History of skin cancer, Hyperlipidemia, Hypertension, and Skin cancer.  She  has a past surgical history that includes Prolapsed bladder (03/2013); Replacement total knee (Left, 01/2011); Replacement total knee (Right, 03/2010); Total abdominal hysterectomy w/ bilateral salpingoophorectomy (1974); and Abdominal hysterectomy. Her family history includes Aneurysm in her father; Diabetes in her brother, brother, sister, and sister; Hypertension in her brother, brother, sister, and sister; Kidney failure in her mother.  She  reports that she has never smoked. She has never used smokeless tobacco. She reports that she does not drink alcohol and does not use drugs.  Current Outpatient Medications on File Prior  to Visit  Medication Sig Dispense Refill   amLODipine (NORVASC) 10 MG tablet TAKE 1 TABLET EVERY DAY 90 tablet 0   aspirin 81 MG chewable tablet Chew 81 mg by mouth daily.     calcium-vitamin D (OSCAL WITH D) 500-200 MG-UNIT tablet Take 1 tablet by mouth daily.     Cholecalciferol (VITAMIN D3) 1000 units CAPS Take by mouth.     cloNIDine (CATAPRES) 0.1 MG tablet TAKE 1 TABLET AS NEEDED FOR BLOOD PRESSURE OVER 180. MAY REPEAT IN 30 MINUTES. DO NOT TAKE MORE THAN 3 IN A DAY 30 tablet 1   docusate sodium (COLACE) 100 MG capsule Take 100 mg by mouth daily.     famotidine (PEPCID) 40 MG tablet Take 1 tablet (40 mg total) by mouth every evening. 30 tablet 1   FLUoxetine (PROZAC) 10 MG capsule TAKE 1 CAPSULE EVERY DAY 90 capsule 0   glipiZIDE (GLUCOTROL XL) 5 MG 24 hr tablet TAKE 1 TABLET EVERY DAY WITH BREAKFAST 90 tablet 0   glucose blood (TRUE METRIX BLOOD GLUCOSE TEST) test strip TEST AS DIRECTED 300 strip 0   lovastatin (MEVACOR) 40 MG tablet TAKE 1 TABLET EVERY DAY 90 tablet 0   metFORMIN (GLUCOPHAGE) 1000 MG tablet TAKE 1 TABLET (1,000 MG TOTAL) BY MOUTH 2 (TWO) TIMES DAILY. 180 tablet 0   montelukast (SINGULAIR) 10 MG tablet Take 1 tablet (10 mg total) by mouth at bedtime. 30 tablet 2   Omega-3 1000 MG CAPS Take 1 capsule by mouth daily.      omeprazole (PRILOSEC) 20 MG capsule TAKE 1  CAPSULE EVERY DAY 90 capsule 0   spironolactone (ALDACTONE) 25 MG tablet Take 1 tablet (25 mg total) by mouth daily. 90 tablet 3   traZODone (DESYREL) 100 MG tablet Take 1 tablet (100 mg total) by mouth at bedtime. 30 tablet 2   TRUEplus Lancets 33G MISC TEST BLOOD SUGAR EVERY DAY 100 each 0   valsartan-hydrochlorothiazide (DIOVAN-HCT) 160-25 MG tablet TAKE 1 TABLET EVERY DAY 90 tablet 0   vitamin E 180 MG (400 UNITS) capsule Take 1 capsule by mouth daily.     No current facility-administered medications on file prior to visit.   She is allergic to celecoxib.Marland Kitchen  Review of Systems Pertinent items noted in HPI  and remainder of comprehensive ROS otherwise negative.   Objective:   Blood pressure (!) 163/69, pulse (!) 101, height 5\' 2"  (1.575 m), weight 165 lb 3.2 oz (74.9 kg). General appearance: alert and no distress Abdomen: soft, non-tender; bowel sounds normal; no masses,  no organomegaly Pelvic: external genitalia with mild erythema, faint pale patches noted near groin region. 2 small raised flesh-colored firm lesions noted on left vulva.  Internal exam not performed.  Extremities: extremities normal, atraumatic, no cyanosis or edema Neurologic: Grossly normal  Assessment:   1. Vulvar itching   2. Nocturia   3. Urge incontinence of urine    Plan:   Vulvar itching - appearance of lichen sclerosis.  Will treat empirically with Clobetasol ointment.  If no improvement in symptoms, may need to consider vulvar biopsy.  Nocturia  and urinary urge incontinence - usually positional. Does not experience the symptoms during the day. Discussed behavioral modifications including limiting fluids 2 hrs prior to going to bed, voiding before going to bed. If still no improvement in symptoms, can further evaluate for evidence of pelvic organ prolapse or detrusor instability. Also may need to consider other medical conditions as factors, such as patient's HTN and DM. Will reassess further next visit.    Rubie Maid, MD [facility]

## 2020-07-26 NOTE — Patient Instructions (Signed)
Lichen Sclerosus Lichen sclerosus is a skin problem. It can happen on any part of the body, but it commonly involves the anal and genital areas. It can cause itching and discomfort in these areas. Treatment can help to control symptoms. When the genital area is affected, getting treatment is important because the conditioncan cause scarring that may lead to other problems if left untreated. What are the causes? The cause of this condition is not known. It may be related to an overactive immune system or a lack of certain hormones. Lichen sclerosus is not an infection or a fungus, and it is not passed from one person to another (non-contagious). What increases the risk? The following factors may make you more likely to develop this condition: You are a woman who has reached menopause. You are a man who was not circumcised. This condition may also develop for the first time in children, usually beforethey enter puberty. What are the signs or symptoms? Symptoms of this condition include: White areas (plaques) on the skin that may be thin and wrinkled, or thickened. Red and swollen patches (lesions) on the skin. Tears or cracks in the skin. Bruising. Blood blisters. Severe itching. Pain, itching, or burning when urinating. Constipation is also common in children with lichen sclerosus, but can be seenin adults. How is this diagnosed? This condition may be diagnosed with a physical exam. In some cases, a tissue sample may be removed to be checked under a microscope (biopsy). How is this treated? This condition may be treated with: Topical steroids. These are medicated creams or ointments that are applied over the affected areas. Medicines that are taken by mouth. Topical immunotherapy. These are medicated creams or ointments that are applied over the affected areas. They stimulate your immune system to fight the skin condition. This may be used if steroids are not effective. Surgery. This may be  needed in more severe cases that are causing problems such as scarring. Follow these instructions at home: Medicines Take over-the-counter and prescription medicines only as told by your health care provider. Use creams or ointments as told by your health care provider. Skin care Do not scratch the affected areas of skin. If you are a woman, be sure to keep the vaginal area as clean and dry as possible. Clean the affected area of skin gently with water only. Pat skin dry and avoid the use of rough towels or toilet paper. Avoid irritating skin products, including soap and scented lotions. Use emollient creams as directed by your health care provider to help reduce itching. General instructions Keep all follow-up visits. This is important. Your condition may cause constipation. To prevent or treat constipation, you may need to: Drink enough fluid to keep your urine pale yellow. Take over-the-counter or prescription medicines. Eat foods that are high in fiber, such as beans, whole grains, and fresh fruits and vegetables. Limit foods that are high in fat and processed sugars, such as fried or sweet foods. Contact a health care provider if: You have increasing redness, swelling, or pain in the affected area. You have fluid, blood, or pus coming from the affected area. You have new lesions on your skin. You have a fever. You have pain during sex. Get help right away if: You develop severe pain or burning in the affected areas, especially in the genital area. Summary Lichen sclerosus is a skin problem. When the genital area is affected, getting treatment is important because the condition can cause scarring that may lead to other  problems if left untreated. This condition is usually treated with medicated creams or ointments (topical steroids) that are applied over the affected areas. Take or use over-the-counter and prescription medicines only as told by your health care provider. Contact a  health care provider if you have new lesions on your skin, have pain during sex, or have increasing redness, swelling, or pain in the affected area. Keep all follow-up visits. This is important. This information is not intended to replace advice given to you by your health care provider. Make sure you discuss any questions you have with your healthcare provider. Document Revised: 05/22/2019 Document Reviewed: 05/22/2019 Elsevier Patient Education  [place].

## 2020-07-27 ENCOUNTER — Telehealth: Payer: Self-pay | Admitting: Obstetrics and Gynecology

## 2020-07-27 NOTE — Telephone Encounter (Signed)
Please see another phone encounter.  

## 2020-07-27 NOTE — Telephone Encounter (Signed)
Pt called no answer LM via Vm that the directions should be on the box. Read the directions to the pt on VM. Pt was advised that if she still had any problems or concerns to please contact the office.

## 2020-07-27 NOTE — Telephone Encounter (Signed)
Pt called asking for a return call from [place] or her nurse. Please Advise.

## 2020-08-08 DIAGNOSIS — Z48817 Encounter for surgical aftercare following surgery on the skin and subcutaneous tissue: Secondary | ICD-10-CM | POA: Diagnosis not present

## 2020-10-23 ENCOUNTER — Ambulatory Visit: Payer: Self-pay | Admitting: Family Medicine

## 2020-10-24 ENCOUNTER — Other Ambulatory Visit: Payer: Self-pay

## 2020-10-24 ENCOUNTER — Ambulatory Visit (INDEPENDENT_AMBULATORY_CARE_PROVIDER_SITE_OTHER): Payer: Medicare HMO | Admitting: Family Medicine

## 2020-10-24 VITALS — BP 167/59 | HR 78 | Temp 98.0°F | Wt 162.0 lb

## 2020-10-24 DIAGNOSIS — R002 Palpitations: Secondary | ICD-10-CM

## 2020-10-24 DIAGNOSIS — N181 Chronic kidney disease, stage 1: Secondary | ICD-10-CM | POA: Diagnosis not present

## 2020-10-24 DIAGNOSIS — E1122 Type 2 diabetes mellitus with diabetic chronic kidney disease: Secondary | ICD-10-CM | POA: Diagnosis not present

## 2020-10-24 DIAGNOSIS — I10 Essential (primary) hypertension: Secondary | ICD-10-CM | POA: Diagnosis not present

## 2020-10-24 DIAGNOSIS — R079 Chest pain, unspecified: Secondary | ICD-10-CM | POA: Diagnosis not present

## 2020-10-24 DIAGNOSIS — E785 Hyperlipidemia, unspecified: Secondary | ICD-10-CM | POA: Diagnosis not present

## 2020-10-24 DIAGNOSIS — Z23 Encounter for immunization: Secondary | ICD-10-CM | POA: Diagnosis not present

## 2020-10-24 LAB — POCT GLYCOSYLATED HEMOGLOBIN (HGB A1C): Hemoglobin A1C: 6.8 % — AB (ref 4.0–5.6)

## 2020-10-24 MED ORDER — SPIRONOLACTONE 25 MG PO TABS: 50.0000 mg | ORAL_TABLET | Freq: Every day | ORAL | Status: DC

## 2020-10-24 NOTE — Progress Notes (Signed)
Established patient visit   Patient: Natalie Bray   DOB: 1934-03-26   86 y.o. Female  MRN: [id number] Visit Date: 10/24/2020  Today's healthcare provider: Lelon Huh, MD   Chief Complaint  Patient presents with   Hyperlipidemia   Diabetes   Hypertension   Subjective    HPI    Diabetes Mellitus Type II, follow-up  Lab Results  Component Value Date   HGBA1C 7.0 (H) 06/21/2020   HGBA1C 6.4 (A) 02/22/2020   HGBA1C 6.7 (A) 10/11/2019   Last seen for diabetes 4 months ago.  Management since then includes continuing the same treatment, also work on lifestyle changes. She reports excellent compliance with treatment. She is not having side effects.   Home blood sugar records: fasting range: 100's  Episodes of hypoglycemia? Yes only occasionally   Current insulin regiment: none Most Recent Eye Exam: 04/19/2020  --------------------------------------------------------------------------------------------------- Hypertension, follow-up  BP Readings from Last 3 Encounters:  10/24/20 (!) 167/59  07/26/20 (!) 163/69  06/21/20 139/73   Wt Readings from Last 3 Encounters:  10/24/20 162 lb (73.5 kg)  07/26/20 165 lb 3.2 oz (74.9 kg)  06/21/20 163 lb (73.9 kg)     She was last seen for hypertension 4 months ago.  BP at that visit was 139/73. Management since that visit includes no changes. She reports excellent compliance with treatment. She is not having side effects.  She is not exercising. She is adherent to low salt diet.   Outside blood pressures are 150's/70-80's.  She does not smoke.  Use of agents associated with hypertension: none.   Pt is requesting a referral to cardiology.   --------------------------------------------------------------------------------------------------- Lipid/Cholesterol, follow-up  Last Lipid Panel: Lab Results  Component Value Date   CHOL 237 (H) 06/21/2020   LDLCALC 114 (H) 06/21/2020   HDL 62 06/21/2020   TRIG  355 (H) 06/21/2020    She was last seen for this 4 months ago.  Management since that visit includes no changes but continue to work on lifestyle changes.  She reports excellent compliance with treatment. She is not having side effects.   Symptoms: No appetite changes No foot ulcerations  Yes chest pain No chest pressure/discomfort  No dyspnea No orthopnea  No fatigue No lower extremity edema  No palpitations No paroxysmal nocturnal dyspnea  No nausea No numbness or tingling of extremity  No polydipsia No polyuria  No speech difficulty No syncope   She is following a Regular diet. Current exercise: none  Last metabolic panel Lab Results  Component Value Date   GLUCOSE 132 (H) 06/21/2020   NA 134 06/21/2020   K 4.3 06/21/2020   BUN 22 06/21/2020   CREATININE 0.84 06/21/2020   CALCIUM 10.8 (H) 06/21/2020   AST 13 06/21/2020   ALT 14 06/21/2020   The ASCVD Risk score (Arnett DK, et al., 2019) failed to calculate for the following reasons:   The 2019 ASCVD risk score is only valid for ages 108 to 60  ---------------------------------------------------------------------------------------------------     Medications: Outpatient Medications Prior to Visit  Medication Sig   amLODipine (NORVASC) 10 MG tablet TAKE 1 TABLET EVERY DAY   aspirin 81 MG chewable tablet Chew 81 mg by mouth daily.   calcium-vitamin D (OSCAL WITH D) 500-200 MG-UNIT tablet Take 1 tablet by mouth daily.   Cholecalciferol (VITAMIN D3) 1000 units CAPS Take by mouth.   cloNIDine (CATAPRES) 0.1 MG tablet TAKE 1 TABLET AS NEEDED FOR BLOOD PRESSURE OVER 180. MAY REPEAT  IN 30 MINUTES. DO NOT TAKE MORE THAN 3 IN A DAY   docusate sodium (COLACE) 100 MG capsule Take 100 mg by mouth daily.   famotidine (PEPCID) 40 MG tablet Take 1 tablet (40 mg total) by mouth every evening.   FLUoxetine (PROZAC) 10 MG capsule TAKE 1 CAPSULE EVERY DAY   glipiZIDE (GLUCOTROL XL) 5 MG 24 hr tablet TAKE 1 TABLET EVERY DAY WITH  BREAKFAST   glucose blood (TRUE METRIX BLOOD GLUCOSE TEST) test strip TEST AS DIRECTED   lovastatin (MEVACOR) 40 MG tablet TAKE 1 TABLET EVERY DAY   metFORMIN (GLUCOPHAGE) 1000 MG tablet TAKE 1 TABLET (1,000 MG TOTAL) BY MOUTH 2 (TWO) TIMES DAILY.   Omega-3 1000 MG CAPS Take 1 capsule by mouth daily.    omeprazole (PRILOSEC) 20 MG capsule TAKE 1 CAPSULE EVERY DAY   spironolactone (ALDACTONE) 25 MG tablet Take 1 tablet (25 mg total) by mouth daily.   traZODone (DESYREL) 100 MG tablet Take 1 tablet (100 mg total) by mouth at bedtime.   TRUEplus Lancets 33G MISC TEST BLOOD SUGAR EVERY DAY   valsartan-hydrochlorothiazide (DIOVAN-HCT) 160-25 MG tablet TAKE 1 TABLET EVERY DAY   vitamin E 180 MG (400 UNITS) capsule Take 1 capsule by mouth daily.   clobetasol ointment (TEMOVATE) 0.05 % Apply to affected area every night for 4 weeks, then every other day for 4 weeks and then twice a week for 4 weeks or until resolution.   montelukast (SINGULAIR) 10 MG tablet Take 1 tablet (10 mg total) by mouth at bedtime.   No facility-administered medications prior to visit.    Review of Systems  Constitutional: Negative.   Respiratory: Negative.    Cardiovascular:  Positive for chest pain (Only occasionally) and palpitations (Only occasionally). Negative for leg swelling.  Gastrointestinal: Negative.   Endocrine: Negative.   Skin:  Negative for wound.  Neurological:  Negative for dizziness, light-headedness, numbness and headaches.      Objective    BP (!) 167/59 (BP Location: Right Arm, Patient Position: Sitting, Cuff Size: Normal)   Pulse 78   Temp 98 F (36.7 C) (Oral)   Wt 162 lb (73.5 kg)   SpO2 100%   BMI 29.63 kg/m    Physical Exam   General: Appearance:     Well developed, well nourished female in no acute distress  Eyes:    PERRL, conjunctiva/corneas clear, EOM's intact       Lungs:     Clear to auscultation bilaterally, respirations unlabored  Heart:    Normal heart rate. Normal  rhythm. No murmurs, rubs, or gallops.    MS:   All extremities are intact.    Neurologic:   Awake, alert, oriented x 3. No apparent focal neurological defect.         Results for orders placed or performed in visit on 10/24/20  POCT glycosylated hemoglobin (Hb A1C)  Result Value Ref Range   Hemoglobin A1C 6.8 (A) 4.0 - 5.6 %    Assessment & Plan     1. Primary hypertension Uncontrolled. Increase spironolactone to 2 x 25mg  daily. Check renal panel about 1 month and change next mail order rf to 50mg  if stable.   2. Type 2 diabetes mellitus with stage 1 chronic kidney disease, without long-term current use of insulin (HCC) Well controlled.  Continue current medications.    3. Hyperlipidemia, unspecified hyperlipidemia type She is tolerating lovastatin well with no adverse effects.    4. Need for influenza vaccination  - Flu  Vaccine QUAD High Dose(Fluad)  5. Palpitations  - Ambulatory referral to Cardiology  6. Chest pain, unspecified type  - Ambulatory referral to Cardiology      The entirety of the information documented in the History of Present Illness, Review of Systems and Physical Exam were personally obtained by me. Portions of this information were initially documented by the CMA and reviewed by me for thoroughness and accuracy.     Lelon Huh, MD  [facility] [phone] (phone) [phone] (fax)  [place]

## 2020-10-24 NOTE — Patient Instructions (Addendum)
Please review the attached list of medications and notify my office if there are any errors.   Increase spironolactone to 2 (two) 25mg  tablets every day.  We need to check your potassium levels in 1 month. We will contact your when it's time. If your potassium levels are stable then I'll send in a prescription for your to start taking 1 (one) 50mg  tablet of the spironolactone when the 25mg  tablets run out.

## 2020-10-26 DIAGNOSIS — R208 Other disturbances of skin sensation: Secondary | ICD-10-CM | POA: Diagnosis not present

## 2020-10-26 DIAGNOSIS — D2262 Melanocytic nevi of left upper limb, including shoulder: Secondary | ICD-10-CM | POA: Diagnosis not present

## 2020-10-26 DIAGNOSIS — L538 Other specified erythematous conditions: Secondary | ICD-10-CM | POA: Diagnosis not present

## 2020-10-26 DIAGNOSIS — D225 Melanocytic nevi of trunk: Secondary | ICD-10-CM | POA: Diagnosis not present

## 2020-10-26 DIAGNOSIS — D485 Neoplasm of uncertain behavior of skin: Secondary | ICD-10-CM | POA: Diagnosis not present

## 2020-10-26 DIAGNOSIS — C44319 Basal cell carcinoma of skin of other parts of face: Secondary | ICD-10-CM | POA: Diagnosis not present

## 2020-10-26 DIAGNOSIS — D2261 Melanocytic nevi of right upper limb, including shoulder: Secondary | ICD-10-CM | POA: Diagnosis not present

## 2020-10-26 DIAGNOSIS — L821 Other seborrheic keratosis: Secondary | ICD-10-CM | POA: Diagnosis not present

## 2020-10-26 DIAGNOSIS — L820 Inflamed seborrheic keratosis: Secondary | ICD-10-CM | POA: Diagnosis not present

## 2020-10-26 DIAGNOSIS — B078 Other viral warts: Secondary | ICD-10-CM | POA: Diagnosis not present

## 2020-10-26 DIAGNOSIS — D0462 Carcinoma in situ of skin of left upper limb, including shoulder: Secondary | ICD-10-CM | POA: Diagnosis not present

## 2020-11-06 DIAGNOSIS — I208 Other forms of angina pectoris: Secondary | ICD-10-CM | POA: Insufficient documentation

## 2020-11-06 DIAGNOSIS — I6523 Occlusion and stenosis of bilateral carotid arteries: Secondary | ICD-10-CM | POA: Insufficient documentation

## 2020-11-06 DIAGNOSIS — E119 Type 2 diabetes mellitus without complications: Secondary | ICD-10-CM | POA: Diagnosis not present

## 2020-11-06 DIAGNOSIS — I10 Essential (primary) hypertension: Secondary | ICD-10-CM | POA: Diagnosis not present

## 2020-11-06 DIAGNOSIS — R002 Palpitations: Secondary | ICD-10-CM | POA: Diagnosis not present

## 2020-11-06 DIAGNOSIS — E782 Mixed hyperlipidemia: Secondary | ICD-10-CM | POA: Diagnosis not present

## 2020-11-15 ENCOUNTER — Other Ambulatory Visit: Payer: Self-pay

## 2020-11-15 ENCOUNTER — Ambulatory Visit: Payer: Medicare HMO

## 2020-11-15 ENCOUNTER — Ambulatory Visit: Payer: Medicare HMO | Admitting: Podiatry

## 2020-11-15 ENCOUNTER — Encounter: Payer: Self-pay | Admitting: Podiatry

## 2020-11-15 DIAGNOSIS — S9032XA Contusion of left foot, initial encounter: Secondary | ICD-10-CM

## 2020-11-15 DIAGNOSIS — L02612 Cutaneous abscess of left foot: Secondary | ICD-10-CM | POA: Diagnosis not present

## 2020-11-15 DIAGNOSIS — M76829 Posterior tibial tendinitis, unspecified leg: Secondary | ICD-10-CM

## 2020-11-15 DIAGNOSIS — L03032 Cellulitis of left toe: Secondary | ICD-10-CM | POA: Diagnosis not present

## 2020-11-15 MED ORDER — CEPHALEXIN 500 MG PO CAPS: 500.0000 mg | ORAL_CAPSULE | Freq: Two times a day (BID) | ORAL | 0 refills | Status: DC

## 2020-11-15 NOTE — Progress Notes (Signed)
She presents today with a chief concern to her fourth toe left foot.  States that the area has blistered since Friday and is very sore.  States that she got a pedicure and noticed it afterwards being that it was more red and swollen.  States that is still red swollen today and painful to touch.  She is also concerned that her AFO to the same foot is strong enough for her foot.  She notices that the feet have started to hurt recently.  Objective: Vital signs are stable she is alert and oriented x3.  Pulses are palpable.  She has severe pes planovalgus with posterior tibial tendon dysfunction.  Hammertoe deformity of the fourth digit left foot demonstrates overlying the DIPJ a small area of cellulitis and what appears to be a dried scab.  I tried to loosen the scab but was exquisitely painful for her.  This does not appear to be an eschar.  Assessment: Hammertoe/mallet toe deformity fourth toe left foot with mild cellulitis secondary to superficial wound.  I placed a bandage and Neosporin ointment and recommended that she soak in Epson salts and warm water daily.  I also recommended that she purchase new tennis shoes since she states that these are about 84 years old for the use of her miso brace.  I did suggest that the only other brace that could possibly help her with her deformity is an full-length [place] brace.  She understands and is amenable to it if necessary.  I like to follow-up with her in a couple weeks for the wound.  We did go ahead and start her on Keflex 500 mg twice a day for that.

## 2020-11-17 DIAGNOSIS — I208 Other forms of angina pectoris: Secondary | ICD-10-CM | POA: Diagnosis not present

## 2020-11-20 ENCOUNTER — Telehealth: Payer: Self-pay

## 2020-11-20 NOTE — Telephone Encounter (Signed)
Copied from [place] [id number]. Topic: General - Other >> Nov 20, 2020  9:51 AM Yvette Rack wrote: Reason for CRM: Pt stated she has been doubling the spironolactone (ALDACTONE) 25 MG tablet because she was told to take 50 MG. Pt stated she is running out of it and asked if a Rx for spironolactone (ALDACTONE) 50 MG could be sent to [place], [place] Phone: [phone]  Fax: [phone]

## 2020-11-21 ENCOUNTER — Other Ambulatory Visit: Payer: Self-pay | Admitting: Family Medicine

## 2020-11-21 DIAGNOSIS — I10 Essential (primary) hypertension: Secondary | ICD-10-CM

## 2020-11-21 MED ORDER — SPIRONOLACTONE 25 MG PO TABS: 50.0000 mg | ORAL_TABLET | Freq: Every day | ORAL | 3 refills | Status: DC

## 2020-11-21 NOTE — Telephone Encounter (Signed)
Please review.  It looks like pt is due for lab work.   Thanks,   -Mickel Baas

## 2020-11-21 NOTE — Telephone Encounter (Signed)
See refill encounter.   Thanks,   -Mickel Baas

## 2020-11-21 NOTE — Telephone Encounter (Signed)
[facility] faxed refill request for the following medications:  spironolactone (ALDACTONE) 25 MG tablet  LOV: 10/24/20 Please advise. Thanks TNP

## 2020-11-22 ENCOUNTER — Telehealth: Payer: Self-pay | Admitting: Family Medicine

## 2020-11-22 ENCOUNTER — Encounter: Payer: Self-pay | Admitting: *Deleted

## 2020-11-22 DIAGNOSIS — M810 Age-related osteoporosis without current pathological fracture: Secondary | ICD-10-CM | POA: Diagnosis not present

## 2020-11-22 DIAGNOSIS — I10 Essential (primary) hypertension: Secondary | ICD-10-CM | POA: Diagnosis not present

## 2020-11-22 DIAGNOSIS — N181 Chronic kidney disease, stage 1: Secondary | ICD-10-CM

## 2020-11-22 DIAGNOSIS — E1122 Type 2 diabetes mellitus with diabetic chronic kidney disease: Secondary | ICD-10-CM

## 2020-11-22 NOTE — Telephone Encounter (Signed)
Reviewed Dr. Maralyn Sago note with the patient. She will stop by tomorrow for lab work that has been ordered. She understands she does not need to fast.

## 2020-11-22 NOTE — Telephone Encounter (Signed)
Please advise patient it is time to check on potassium levels, kidney functions and vitamin D levels since increasing spironolactone dose. Have left order at lab. Does not need to be fasting.

## 2020-11-22 NOTE — Telephone Encounter (Signed)
This encounter was created in error - please disregard.

## 2020-11-22 NOTE — Telephone Encounter (Signed)
Tried calling patient. Left message to call back. OK for [facility]EC triage to advise.  ?

## 2020-11-23 DIAGNOSIS — I10 Essential (primary) hypertension: Secondary | ICD-10-CM | POA: Diagnosis not present

## 2020-11-23 DIAGNOSIS — I208 Other forms of angina pectoris: Secondary | ICD-10-CM | POA: Diagnosis not present

## 2020-11-23 DIAGNOSIS — I6523 Occlusion and stenosis of bilateral carotid arteries: Secondary | ICD-10-CM | POA: Diagnosis not present

## 2020-11-23 DIAGNOSIS — E119 Type 2 diabetes mellitus without complications: Secondary | ICD-10-CM | POA: Diagnosis not present

## 2020-11-23 DIAGNOSIS — E785 Hyperlipidemia, unspecified: Secondary | ICD-10-CM | POA: Diagnosis not present

## 2020-11-24 LAB — RENAL FUNCTION PANEL
Albumin: 4.6 g/dL (ref 3.6–4.6)
BUN/Creatinine Ratio: 20 (ref 12–28)
BUN: 20 mg/dL (ref 8–27)
CO2: 23 mmol/L (ref 20–29)
Calcium: 10.4 mg/dL — ABNORMAL HIGH (ref 8.7–10.3)
Chloride: 93 mmol/L — ABNORMAL LOW (ref 96–106)
Creatinine, Ser: 0.99 mg/dL (ref 0.57–1.00)
Glucose: 95 mg/dL (ref 70–99)
Phosphorus: 4.5 mg/dL — ABNORMAL HIGH (ref 3.0–4.3)
Potassium: 4.3 mmol/L (ref 3.5–5.2)
Sodium: 130 mmol/L — ABNORMAL LOW (ref 134–144)
eGFR: 56 mL/min/{1.73_m2} — ABNORMAL LOW (ref >59)

## 2020-11-24 LAB — PARATHYROID HORMONE, INTACT (NO CA): PTH: 27 pg/mL (ref 15–65)

## 2020-11-24 LAB — VITAMIN D 25 HYDROXY (VIT D DEFICIENCY, FRACTURES): Vit D, 25-Hydroxy: 40.3 ng/mL (ref 30.0–100.0)

## 2020-11-27 ENCOUNTER — Ambulatory Visit: Payer: Medicare HMO | Admitting: Podiatry

## 2020-11-29 ENCOUNTER — Encounter: Payer: Self-pay | Admitting: Podiatry

## 2020-11-29 ENCOUNTER — Other Ambulatory Visit: Payer: Self-pay

## 2020-11-29 ENCOUNTER — Ambulatory Visit: Payer: Medicare HMO | Admitting: Podiatry

## 2020-11-29 DIAGNOSIS — M7752 Other enthesopathy of left foot: Secondary | ICD-10-CM | POA: Diagnosis not present

## 2020-11-29 MED ORDER — TRIAMCINOLONE ACETONIDE 40 MG/ML IJ SUSP
20.0000 mg | Freq: Once | INTRAMUSCULAR | Status: AC
  Administered 2020-11-29: 20 mg

## 2020-11-29 NOTE — Progress Notes (Signed)
She presents today for follow-up of her infected toe fourth left.  She states that the toe is feeling much better and she is completed her antibiotics.  She states that she continues to put some Neosporin on it occasionally.  She is complaining today also of pain along the lateral aspect of her left foot.  Objective: Vital signs are stable alert oriented x3 the hammertoe fourth digit left foot appears to be healing very nicely there is no erythema cellulitis drainage or odor associated with it at this time.  She does have pain on palpation of her sinus tarsi of her left foot.  She has severe osteoarthritic foot with posterior tibial tendon dysfunction a complete collapse of her medial foot with jamming of her subtalar joint and lateral ankle.  Assessment: Capsulitis osteoarthritis subtalar joint left.  Well-healing fourth toe cellulitis resolved.  Plan: I injected the subtalar joint today.  I injected with 10 mg Kenalog 5 mg Marcaine point of maximal tenderness.  Follow-up with me on an as-needed basis.  We did address the burning to the plantar aspect of the foot discussing the use of Voltaren gel.

## 2020-11-30 ENCOUNTER — Telehealth: Payer: Self-pay

## 2020-11-30 DIAGNOSIS — E782 Mixed hyperlipidemia: Secondary | ICD-10-CM | POA: Diagnosis not present

## 2020-11-30 DIAGNOSIS — I6523 Occlusion and stenosis of bilateral carotid arteries: Secondary | ICD-10-CM | POA: Diagnosis not present

## 2020-11-30 DIAGNOSIS — I10 Essential (primary) hypertension: Secondary | ICD-10-CM

## 2020-11-30 NOTE — Telephone Encounter (Signed)
Pt advised.  Pt states she saw Dr. Nehemiah Massed today and he wanted to increase valsartan/HCTZ to 320/25mg .  She wanted to run that by you before starting it.  Also she wanted to know if you wanted to increase her spironolactone to 50mg  daily.  She says you mention it to her at the last appointment.   Thanks,   -Mickel Baas

## 2020-11-30 NOTE — Telephone Encounter (Signed)
-----   Message from Birdie Sons, MD sent at 11/29/2020 12:45 PM EST ----- Labs are all good. Sodium level is a little bit low which is due to the spironolactone and  valsartan-hctz, but not low enough to be a problem. . Continue current medications and follow up in January as scheduled.

## 2020-11-30 NOTE — Telephone Encounter (Signed)
She should be taking 50mg  of the spironolactone. A refill of the 25 was sent to her mail order pharmacy a few weeks ago, so she will need to take 2 a day. It would fine to increase the valsartan-hctz. Does she need a new prescription sent in?

## 2020-12-01 MED ORDER — SPIRONOLACTONE 25 MG PO TABS: 50.0000 mg | ORAL_TABLET | Freq: Every day | ORAL | 1 refills | Status: DC

## 2020-12-01 MED ORDER — VALSARTAN-HYDROCHLOROTHIAZIDE 320-25 MG PO TABS: 1.0000 | ORAL_TABLET | Freq: Every day | ORAL | 1 refills | Status: DC

## 2020-12-01 NOTE — Telephone Encounter (Signed)
Pt advised.  Both prescriptions have been sent to [facility].   Thanks,   -Mickel Baas

## 2020-12-05 DIAGNOSIS — C44319 Basal cell carcinoma of skin of other parts of face: Secondary | ICD-10-CM | POA: Diagnosis not present

## 2020-12-11 DIAGNOSIS — Z03818 Encounter for observation for suspected exposure to other biological agents ruled out: Secondary | ICD-10-CM | POA: Diagnosis not present

## 2020-12-11 DIAGNOSIS — Z20822 Contact with and (suspected) exposure to covid-19: Secondary | ICD-10-CM | POA: Diagnosis not present

## 2020-12-12 DIAGNOSIS — D0462 Carcinoma in situ of skin of left upper limb, including shoulder: Secondary | ICD-10-CM | POA: Diagnosis not present

## 2020-12-13 ENCOUNTER — Ambulatory Visit (INDEPENDENT_AMBULATORY_CARE_PROVIDER_SITE_OTHER): Payer: Medicare HMO | Admitting: Physician Assistant

## 2020-12-13 ENCOUNTER — Telehealth: Payer: Self-pay | Admitting: Family Medicine

## 2020-12-13 ENCOUNTER — Other Ambulatory Visit: Payer: Self-pay

## 2020-12-13 ENCOUNTER — Encounter: Payer: Self-pay | Admitting: Physician Assistant

## 2020-12-13 VITALS — BP 134/60 | HR 78 | Temp 98.2°F | Resp 16 | Wt 162.0 lb

## 2020-12-13 DIAGNOSIS — Z9189 Other specified personal risk factors, not elsewhere classified: Secondary | ICD-10-CM

## 2020-12-13 DIAGNOSIS — J209 Acute bronchitis, unspecified: Secondary | ICD-10-CM

## 2020-12-13 DIAGNOSIS — R051 Acute cough: Secondary | ICD-10-CM | POA: Diagnosis not present

## 2020-12-13 MED ORDER — AMOXICILLIN-POT CLAVULANATE 875-125 MG PO TABS: 1.0000 | ORAL_TABLET | Freq: Two times a day (BID) | ORAL | 0 refills | Status: DC

## 2020-12-13 MED ORDER — ALBUTEROL SULFATE HFA 108 (90 BASE) MCG/ACT IN AERS: 2.0000 | INHALATION_SPRAY | Freq: Four times a day (QID) | RESPIRATORY_TRACT | 2 refills | Status: DC | PRN

## 2020-12-13 MED ORDER — BENZONATATE 100 MG PO CAPS: 100.0000 mg | ORAL_CAPSULE | Freq: Three times a day (TID) | ORAL | 0 refills | Status: AC | PRN

## 2020-12-13 NOTE — Telephone Encounter (Signed)
Patient called with cough trying to get an earlier appt before December. Please call back

## 2020-12-13 NOTE — Progress Notes (Signed)
Established patient visit   Patient: Natalie Bray   DOB: October 16, 1934   86 y.o. Female  MRN: [id number] Visit Date: 12/13/2020  Today's healthcare provider: Mikey Kirschner, PA-C   Chief Complaint  Patient presents with   Cough   Subjective     Natalie Bray is a 86 y/o female who presents today with cough, nasal congestion, postnasal drip, sore throat, wheezing x4 days.  She states she has been trying over-the-counter remedies including Tylenol, Airborne, cough drops, [place] DM.  The cough medication helps her sleep, but does not stop her cough.  She has felt no better or worse over the next 4 days.  She presents today as she is nervous this will turn into pneumonia.  She had 1 episode of acute shortness of breath from a coughing spell.  After rest she was able to catch her breath.  Denies any dizziness, syncope, fevers, chills, myalgias.  Medications: Outpatient Medications Prior to Visit  Medication Sig   amLODipine (NORVASC) 10 MG tablet TAKE 1 TABLET EVERY DAY   aspirin 81 MG chewable tablet Chew 81 mg by mouth daily.   calcium-vitamin D (OSCAL WITH D) 500-200 MG-UNIT tablet Take 1 tablet by mouth daily.   Cholecalciferol (VITAMIN D3) 1000 units CAPS Take by mouth.   clobetasol ointment (TEMOVATE) 0.05 % Apply to affected area every night for 4 weeks, then every other day for 4 weeks and then twice a week for 4 weeks or until resolution.   cloNIDine (CATAPRES) 0.1 MG tablet TAKE 1 TABLET AS NEEDED FOR BLOOD PRESSURE OVER 180. MAY REPEAT IN 30 MINUTES. DO NOT TAKE MORE THAN 3 IN A DAY   docusate sodium (COLACE) 100 MG capsule Take 100 mg by mouth daily.   famotidine (PEPCID) 40 MG tablet Take 1 tablet (40 mg total) by mouth every evening.   FLUoxetine (PROZAC) 10 MG capsule TAKE 1 CAPSULE EVERY DAY   glipiZIDE (GLUCOTROL XL) 5 MG 24 hr tablet TAKE 1 TABLET EVERY DAY WITH BREAKFAST   glucose blood (TRUE METRIX BLOOD GLUCOSE TEST) test strip TEST AS DIRECTED   lovastatin (MEVACOR) 40  MG tablet TAKE 1 TABLET EVERY DAY   metFORMIN (GLUCOPHAGE) 1000 MG tablet TAKE 1 TABLET (1,000 MG TOTAL) BY MOUTH 2 (TWO) TIMES DAILY.   Omega-3 1000 MG CAPS Take 1 capsule by mouth daily.    omeprazole (PRILOSEC) 20 MG capsule TAKE 1 CAPSULE EVERY DAY   spironolactone (ALDACTONE) 25 MG tablet Take 2 tablets (50 mg total) by mouth daily.   traZODone (DESYREL) 100 MG tablet Take 1 tablet (100 mg total) by mouth at bedtime.   TRUEplus Lancets 33G MISC TEST BLOOD SUGAR EVERY DAY   valsartan-hydrochlorothiazide (DIOVAN-HCT) 320-25 MG tablet Take 1 tablet by mouth daily.   vitamin E 180 MG (400 UNITS) capsule Take 1 capsule by mouth daily.   No facility-administered medications prior to visit.    Review of Systems  Constitutional:  Positive for fatigue. Negative for chills and fever.  HENT:  Positive for postnasal drip, rhinorrhea and sore throat. Negative for ear pain.   Respiratory:  Positive for cough and wheezing. Negative for shortness of breath.   Cardiovascular:  Positive for chest pain.  Musculoskeletal:  Positive for myalgias.  Neurological:  Negative for headaches.     Objective    BP 134/60 (BP Location: Left Arm, Patient Position: Sitting, Cuff Size: Normal)   Pulse 78   Temp 98.2 F (36.8 C) (Oral)   Resp 16  Wt 162 lb (73.5 kg)   SpO2 96%   BMI 29.63 kg/m     Physical Exam Constitutional:      Appearance: Normal appearance. She is not ill-appearing.  Cardiovascular:     Rate and Rhythm: Normal rate and regular rhythm.     Pulses: Normal pulses.     Heart sounds: Normal heart sounds.  Pulmonary:     Effort: Pulmonary effort is normal.     Breath sounds: Examination of the right-upper field reveals wheezing. Examination of the right-middle field reveals wheezing. Examination of the right-lower field reveals wheezing. Wheezing present.  Neurological:     Mental Status: She is alert and oriented to person, place, and time.  Psychiatric:        Mood and Affect:  Mood normal.        Behavior: Behavior normal.     No results found for any visits on 12/13/20.  Assessment & Plan     Acute bronchitis, moderate risk for pneumonia Rx tessalon pearles for cough, to alternate with her current cough medicine Rx albuterol inhaler for wheezing, 2 puffs PRN wheezing Due to the upcoming holiday weekend, and her increased risk for pneumonia rx augmentin 875 mg POBID x 10 days. Instructed to take if not improved in next few days.  Tylenol as needed, advised increase fluids, steam showers/humidifier.  Return if symptoms worsen or fail to improve. Instructed to go to UC or ED if SOB worsens, develops high fevers, worsened symptoms.     I, Mikey Kirschner, PA-C have reviewed all documentation for this visit. The documentation on  12/13/2020 for the exam, diagnosis, procedures, and orders are all accurate and complete.    Mikey Kirschner, PA-C  [facility] [phone] (phone) [phone] (fax)  [place]

## 2020-12-13 NOTE — Telephone Encounter (Signed)
Patient has had a cough x 3 days. Tested negative for COVID on 12/11/2020. Appointment scheduled today at 3:40pm.

## 2021-01-16 DIAGNOSIS — G5601 Carpal tunnel syndrome, right upper limb: Secondary | ICD-10-CM | POA: Diagnosis not present

## 2021-01-27 ENCOUNTER — Other Ambulatory Visit: Payer: Self-pay | Admitting: Family Medicine

## 2021-01-27 NOTE — Telephone Encounter (Signed)
Requested Prescriptions  Pending Prescriptions Disp Refills   [place] [Pharmacy Med Name: TRUEPLUS LANCETS 33G] 100 each 0    Sig: TEST BLOOD SUGAR EVERY DAY     Endocrinology: Diabetes - Testing Supplies Passed - 01/27/2021  3:00 AM      Passed - Valid encounter within last 12 months    Recent Outpatient Visits          1 month ago Acute bronchitis, unspecified organism   [facility], [place], PA-C   3 months ago Primary hypertension   [facility], MD   7 months ago Type 2 diabetes mellitus with stage 1 chronic kidney disease, without long-term current use of insulin ([place])   [facility] Birdie Sons, MD   11 months ago Primary hypertension   [facility], Kirstie Peri, MD   1 year ago Gastroesophageal reflux disease, unspecified whether esophagitis present   [place], Kirstie Peri, MD      Future Appointments            In 6 days Fisher, Kirstie Peri, MD [facility], [facility]            amLODipine (NORVASC) 10 MG tablet [Pharmacy Med Name: AMLODIPINE BESYLATE 10 MG Tablet] 90 tablet 0    Sig: TAKE 1 TABLET EVERY DAY     Cardiovascular:  Calcium Channel Blockers Passed - 01/27/2021  3:00 AM      Passed - Last BP in normal range    BP Readings from Last 1 Encounters:  12/13/20 134/60         Passed - Valid encounter within last 6 months    Recent Outpatient Visits          1 month ago Acute bronchitis, unspecified organism   [facility], [place], PA-C   3 months ago Primary hypertension   [facility] Birdie Sons, MD   7 months ago Type 2 diabetes mellitus with stage 1 chronic kidney disease, without long-term current use of insulin ([place])   [facility] Birdie Sons, MD   11 months ago Primary hypertension   [facility] Birdie Sons, MD   1 year ago  Gastroesophageal reflux disease, unspecified whether esophagitis present   [facility] Birdie Sons, MD      Future Appointments            In 6 days Fisher, Kirstie Peri, MD [facility], [facility]            omeprazole (PRILOSEC) 20 MG capsule [Pharmacy Med Name: OMEPRAZOLE 20 MG Capsule Delayed Release] 90 capsule 0    Sig: TAKE [place]     Gastroenterology: Proton Pump Inhibitors Passed - 01/27/2021  3:00 AM      Passed - Valid encounter within last 12 months    Recent Outpatient Visits          1 month ago Acute bronchitis, unspecified organism   [facility] [place], [place], PA-C   3 months ago Primary hypertension   [facility] Birdie Sons, MD   7 months ago Type 2 diabetes mellitus with stage 1 chronic kidney disease, without long-term current use of insulin [facility])   [facility] Birdie Sons, MD   11 months ago Primary hypertension   [facility] Birdie Sons, MD   1 year ago Gastroesophageal  reflux disease, unspecified whether esophagitis present   [place], MD      Future Appointments            In 6 days Fisher, Kirstie Peri, MD [facility], [place] BLOOD GLUCOSE TEST test strip [Pharmacy Med Name: TRUE METRIX SELF MONITORING BLOOD GLUCOSE STRIPS   Strip] 300 strip 0    Sig: TEST AS DIRECTED     Endocrinology: Diabetes - Testing Supplies Passed - 01/27/2021  3:00 AM      Passed - Valid encounter within last 12 months    Recent Outpatient Visits          1 month ago Acute bronchitis, unspecified organism   [facility], [place], PA-C   3 months ago Primary hypertension   [facility], MD   7 months ago Type 2 diabetes mellitus with stage 1 chronic kidney disease, without long-term current use of insulin ([place])   [facility]  Birdie Sons, MD   11 months ago Primary hypertension   [facility] Birdie Sons, MD   1 year ago Gastroesophageal reflux disease, unspecified whether esophagitis present   [facility] Birdie Sons, MD      Future Appointments            In 6 days Fisher, Kirstie Peri, MD [facility], [facility]            lovastatin (MEVACOR) 40 MG tablet [Pharmacy Med Name: LOVASTATIN 40 MG Tablet] 90 tablet 0    Sig: TAKE 1 TABLET EVERY DAY     Cardiovascular:  Antilipid - Statins Failed - 01/27/2021  3:00 AM      Failed - Total Cholesterol in normal range and within 360 days    Cholesterol, Total  Date Value Ref Range Status  06/21/2020 237 (H) 100 - 199 mg/dL Final         Failed - LDL in normal range and within 360 days    LDL Chol Calc (NIH)  Date Value Ref Range Status  06/21/2020 114 (H) 0 - 99 mg/dL Final         Failed - Triglycerides in normal range and within 360 days    Triglycerides  Date Value Ref Range Status  06/21/2020 355 (H) 0 - 149 mg/dL Final         Passed - HDL in normal range and within 360 days    HDL  Date Value Ref Range Status  06/21/2020 62 >39 mg/dL Final         Passed - Patient is not pregnant      Passed - Valid encounter within last 12 months    Recent Outpatient Visits          1 month ago Acute bronchitis, unspecified organism   [facility] [place], [place], PA-C   3 months ago Primary hypertension   [facility] Birdie Sons, MD   7 months ago Type 2 diabetes mellitus with stage 1 chronic kidney disease, without long-term current use of insulin [facility])   [facility] Birdie Sons, MD   11 months ago Primary hypertension   [facility] Birdie Sons, MD   1 year ago Gastroesophageal reflux disease, unspecified whether esophagitis present   [facility] Birdie Sons, MD  Future Appointments            In  6 days Fisher, Kirstie Peri, MD [facility], [place]

## 2021-01-31 DIAGNOSIS — G5601 Carpal tunnel syndrome, right upper limb: Secondary | ICD-10-CM | POA: Diagnosis not present

## 2021-02-02 ENCOUNTER — Other Ambulatory Visit: Payer: Self-pay

## 2021-02-02 ENCOUNTER — Ambulatory Visit (INDEPENDENT_AMBULATORY_CARE_PROVIDER_SITE_OTHER): Payer: Medicare HMO | Admitting: Family Medicine

## 2021-02-02 VITALS — BP 128/52 | HR 63 | Temp 97.6°F | Wt 159.0 lb

## 2021-02-02 DIAGNOSIS — N181 Chronic kidney disease, stage 1: Secondary | ICD-10-CM

## 2021-02-02 DIAGNOSIS — I10 Essential (primary) hypertension: Secondary | ICD-10-CM

## 2021-02-02 DIAGNOSIS — E1122 Type 2 diabetes mellitus with diabetic chronic kidney disease: Secondary | ICD-10-CM

## 2021-02-02 DIAGNOSIS — E871 Hypo-osmolality and hyponatremia: Secondary | ICD-10-CM | POA: Diagnosis not present

## 2021-02-02 MED ORDER — TRAZODONE HCL 100 MG PO TABS: 50.0000 mg | ORAL_TABLET | Freq: Every evening | ORAL | Status: DC | PRN

## 2021-02-02 NOTE — Progress Notes (Signed)
Established patient visit   Patient: Natalie Bray   DOB: [birth year]   87 y.o. Female  MRN: [id number] Visit Date: 02/02/2021  Today's healthcare provider: Lelon Huh, MD   Chief Complaint  Patient presents with   Hypertension   Diabetes   Subjective    HPI  Hypertension, follow-up  BP Readings from Last 3 Encounters:  02/02/21 (!) 128/52  12/13/20 134/60  10/24/20 (!) 167/59   Wt Readings from Last 3 Encounters:  02/02/21 159 lb (72.1 kg)  12/13/20 162 lb (73.5 kg)  10/24/20 162 lb (73.5 kg)     She was last seen for hypertension 3 months ago.  BP at that visit was as above. Management since that visit includes increasing Spironolactone to 25 mg 2 daily, renal panel was checked 1 month later and improved.  She reports good compliance with treatment. She is not having side effects.  She is following a Regular diet. She is not exercising. She does not smoke.  Use of agents associated with hypertension: none.   Outside blood pressures are running 140's-150's over 60's. Symptoms: No chest pain No chest pressure  No palpitations No syncope  No dyspnea No orthopnea  No paroxysmal nocturnal dyspnea No lower extremity edema   Pertinent labs: Lab Results  Component Value Date   NA 130 (L) 11/22/2020   K 4.3 11/22/2020   CREATININE 0.99 11/22/2020   EGFR 56 (L) 11/22/2020   GLUCOSE 95 11/22/2020   TSH 1.740 05/28/2019     The ASCVD Risk score (Arnett DK, et al., 2019) failed to calculate for the following reasons:   The 2019 ASCVD risk score is only valid for ages 57 to 62   --------------------------------------------------------------------------------------------------- Diabetes Mellitus Type II, Follow-up  Lab Results  Component Value Date   HGBA1C 6.8 (A) 10/24/2020   HGBA1C 7.0 (H) 06/21/2020   HGBA1C 6.4 (A) 02/22/2020   Wt Readings from Last 3 Encounters:  02/02/21 159 lb (72.1 kg)  12/13/20 162 lb (73.5 kg)  10/24/20 162 lb (73.5 kg)    Last seen for diabetes 3 months ago.  Management since then includes none. She reports good compliance with treatment. She is not having side effects.  Symptoms: No fatigue No foot ulcerations  No appetite changes No nausea  No paresthesia of the feet  No polydipsia  No polyuria No visual disturbances   No vomiting     Home blood sugar records: fasting range: 50's-200 Holidays and had death in her family which caused her to have to travel and diet changed. Episodes of hypoglycemia? Yes patient states that sometimes she feel shot but mostly she feels weak   Current insulin regiment: none Most Recent Eye Exam: 03/2020 Current exercise: none  Pertinent Labs: Lab Results  Component Value Date   CHOL 237 (H) 06/21/2020   HDL 62 06/21/2020   LDLCALC 114 (H) 06/21/2020   TRIG 355 (H) 06/21/2020   CHOLHDL 3.8 06/21/2020   Lab Results  Component Value Date   NA 130 (L) 11/22/2020   K 4.3 11/22/2020   CREATININE 0.99 11/22/2020   EGFR 56 (L) 11/22/2020   MICROALBUR 100 02/04/2018     ---------------------------------------------------------------------------------------------------   Medications: Outpatient Medications Prior to Visit  Medication Sig   amLODipine (NORVASC) 10 MG tablet TAKE 1 TABLET EVERY DAY   aspirin 81 MG chewable tablet Chew 81 mg by mouth daily.   calcium-vitamin D (OSCAL WITH D) 500-200 MG-UNIT tablet Take 1 tablet by mouth daily.  Cholecalciferol (VITAMIN D3) 1000 units CAPS Take by mouth.   cloNIDine (CATAPRES) 0.1 MG tablet TAKE 1 TABLET AS NEEDED FOR BLOOD PRESSURE OVER 180. MAY REPEAT IN 30 MINUTES. DO NOT TAKE MORE THAN 3 IN A DAY   docusate sodium (COLACE) 100 MG capsule Take 100 mg by mouth daily.   famotidine (PEPCID) 40 MG tablet Take 1 tablet (40 mg total) by mouth every evening.   FLUoxetine (PROZAC) 10 MG capsule TAKE 1 CAPSULE EVERY DAY   glipiZIDE (GLUCOTROL XL) 5 MG 24 hr tablet TAKE 1 TABLET EVERY DAY WITH BREAKFAST   lovastatin  (MEVACOR) 40 MG tablet TAKE 1 TABLET EVERY DAY   metFORMIN (GLUCOPHAGE) 1000 MG tablet TAKE 1 TABLET (1,000 MG TOTAL) BY MOUTH 2 (TWO) TIMES DAILY.   Omega-3 1000 MG CAPS Take 1 capsule by mouth daily.    omeprazole (PRILOSEC) 20 MG capsule TAKE 1 CAPSULE EVERY DAY   spironolactone (ALDACTONE) 25 MG tablet Take 2 tablets (50 mg total) by mouth daily.   TRUE METRIX BLOOD GLUCOSE TEST test strip TEST AS DIRECTED   TRUEplus Lancets 33G MISC TEST BLOOD SUGAR EVERY DAY   valsartan-hydrochlorothiazide (DIOVAN-HCT) 320-25 MG tablet Take 1 tablet by mouth daily.   vitamin E 180 MG (400 UNITS) capsule Take 1 capsule by mouth daily.   [DISCONTINUED] traZODone (DESYREL) 100 MG tablet Take 1 tablet (100 mg total) by mouth at bedtime.   albuterol (VENTOLIN HFA) 108 (90 Base) MCG/ACT inhaler Inhale 2 puffs into the lungs every 6 (six) hours as needed for wheezing or shortness of breath. (Patient not taking: Reported on 02/02/2021)   [DISCONTINUED] amoxicillin-clavulanate (AUGMENTIN) 875-125 MG tablet Take 1 tablet by mouth 2 (two) times daily.   [DISCONTINUED] clobetasol ointment (TEMOVATE) 0.05 % Apply to affected area every night for 4 weeks, then every other day for 4 weeks and then twice a week for 4 weeks or until resolution.   No facility-administered medications prior to visit.        Objective    BP (!) 128/52 (BP Location: Left Arm, Patient Position: Sitting, Cuff Size: Normal)    Pulse 63    Temp 97.6 F (36.4 C) (Oral)    Wt 159 lb (72.1 kg)    SpO2 100%    BMI 29.08 kg/m  Vitals:   02/02/21 0840 02/02/21 0852  BP: (!) 121/109 (!) 128/52  Pulse: 63   Temp: 97.6 F (36.4 C)   TempSrc: Oral   SpO2: 100%   Weight: 159 lb (72.1 kg)     Physical Exam   General: Appearance:     Well developed, well nourished female in no acute distress  Eyes:    PERRL, conjunctiva/corneas clear, EOM's intact       Lungs:     Clear to auscultation bilaterally, respirations unlabored  Heart:    Normal  heart rate. Normal rhythm. No murmurs, rubs, or gallops.    MS:   All extremities are intact.    Neurologic:   Awake, alert, oriented x 3. No apparent focal neurological defect.          Assessment & Plan     1. Type 2 diabetes mellitus with stage 1 chronic kidney disease, without long-term current use of insulin (HCC) Doing well current medications.  - Hemoglobin A1c  2. Primary hypertension Continue current medications.  Well controlled.   - Comprehensive metabolic panel  3. Hyponatremia Likely secondary to diuretics. Checking labs as above  Future Appointments  Date Time Provider  [place]  06/05/2021  9:00 AM Caryn Section, Kirstie Peri, MD [facility]        I,Elena D DeSanto,acting as a scribe for Lelon Huh, MD.,have documented all relevant documentation on the behalf of Lelon Huh, MD,as directed by  Lelon Huh, MD while in the presence of Lelon Huh, MD.  The entirety of the information documented in the History of Present Illness, Review of Systems and Physical Exam were personally obtained by me. Portions of this information were initially documented by the CMA and reviewed by me for thoroughness and accuracy.     Lelon Huh, MD  [facility] [phone] (phone) [phone] (fax)  [place]

## 2021-02-03 LAB — COMPREHENSIVE METABOLIC PANEL
ALT: 53 IU/L — ABNORMAL HIGH (ref 0–32)
AST: 24 IU/L (ref 0–40)
Albumin/Globulin Ratio: 1.8 (ref 1.2–2.2)
Albumin: 4.4 g/dL (ref 3.6–4.6)
Alkaline Phosphatase: 122 IU/L — ABNORMAL HIGH (ref 44–121)
BUN/Creatinine Ratio: 26 (ref 12–28)
BUN: 22 mg/dL (ref 8–27)
Bilirubin Total: 0.3 mg/dL (ref 0.0–1.2)
CO2: 23 mmol/L (ref 20–29)
Calcium: 10.7 mg/dL — ABNORMAL HIGH (ref 8.7–10.3)
Chloride: 94 mmol/L — ABNORMAL LOW (ref 96–106)
Creatinine, Ser: 0.84 mg/dL (ref 0.57–1.00)
Globulin, Total: 2.4 g/dL (ref 1.5–4.5)
Glucose: 158 mg/dL — ABNORMAL HIGH (ref 70–99)
Potassium: 4.4 mmol/L (ref 3.5–5.2)
Sodium: 129 mmol/L — ABNORMAL LOW (ref 134–144)
Total Protein: 6.8 g/dL (ref 6.0–8.5)
eGFR: 68 mL/min/{1.73_m2} (ref >59)

## 2021-02-03 LAB — HEMOGLOBIN A1C
Est. average glucose Bld gHb Est-mCnc: 146 mg/dL
Hgb A1c MFr Bld: 6.7 % — ABNORMAL HIGH (ref 4.8–5.6)

## 2021-02-09 ENCOUNTER — Other Ambulatory Visit: Payer: Self-pay | Admitting: Surgery

## 2021-02-13 ENCOUNTER — Other Ambulatory Visit: Payer: Self-pay

## 2021-02-13 ENCOUNTER — Encounter
Admission: RE | Admit: 2021-02-13 | Discharge: 2021-02-13 | Disposition: A | Discharge status: Home / Self Care | Payer: Medicare HMO | Source: Ambulatory Visit | Attending: Surgery | Admitting: Surgery

## 2021-02-13 HISTORY — DX: Gastro-esophageal reflux disease without esophagitis: K21.9

## 2021-02-13 HISTORY — DX: Anxiety disorder, unspecified: F41.9

## 2021-02-13 NOTE — Patient Instructions (Addendum)
Your procedure is scheduled on: 02/21/21 Report to [facility]. To find out your arrival time please call [phone] between 1PM - 3PM on 02/20/21 .  Remember: Instructions that are not followed completely may result in serious medical risk, up to and including death, or upon the discretion of your surgeon and anesthesiologist your surgery may need to be rescheduled.     _X__ 1. Do not eat food after midnight the night before your procedure.                 No gum chewing or hard candies. You may drink clear liquids up to 2 hours                 before you are scheduled to arrive for your surgery- DO not drink clear                 liquids within 2 hours of the start of your surgery.                  Diabetics water only  __X__2.  On the morning of surgery brush your teeth with toothpaste and water, you                 may rinse your mouth with mouthwash if you wish.  Do not swallow any              toothpaste of mouthwash.     _X__ 3.  No Alcohol for 24 hours before or after surgery.   _X__ 4.  Do Not Smoke or use e-cigarettes For 24 Hours Prior to Your Surgery.                 Do not use any chewable tobacco products for at least 6 hours prior to                 surgery.  ____  5.  Bring all medications with you on the day of surgery if instructed.   __X__  6.  Notify your doctor if there is any change in your medical condition      (cold, fever, infections).     Do not wear jewelry, make-up, hairpins, clips or nail polish. Do not wear lotions, powders, or perfumes.  Do not shave body hair 48 hours prior to surgery. Men may shave face and neck. Do not bring valuables to the hospital.    [facility] is not responsible for any belongings or valuables.  Contacts, dentures/partials or body piercings may not be worn into surgery. Bring a case for your contacts, glasses or hearing aids, a denture cup will be supplied. Leave your  suitcase in the car. After surgery it may be brought to your room. For patients admitted to the hospital, discharge time is determined by your treatment team.   Patients discharged the day of surgery will not be allowed to drive home.   Please read over the following fact sheets that you were given:     __X__ Take these medicines the morning of surgery with A SIP OF WATER:    1. amLODipine (NORVASC) 10 MG tablet  2. lovastatin (MEVACOR) 40 MG tablet  3. omeprazole (PRILOSEC) 20 MG capsule  4. FLUoxetine (PROZAC) 10 MG capsule if needed  5.  6.  ____ Fleet Enema (as directed)   ____ Use CHG Soap/SAGE wipes as directed  ____ Use inhalers on the day of surgery  __X__ Stop metformin/Janumet/Farxiga 2 days prior to surgery  Last dose 02/19/21  ____ Take 1/2 of usual insulin dose the night before surgery. No insulin the morning          of surgery.   ____ Stop Blood Thinners Coumadin/Plavix/Xarelto/Pleta/Pradaxa/Eliquis/Effient/Aspirin  on   Or contact your Surgeon, Cardiologist or Medical Doctor regarding  ability to stop your blood thinners  __X__ Stop Anti-inflammatories 7 days before surgery such as Advil, Ibuprofen, Motrin,  BC or Goodies Powder, Naprosyn, Naproxen, Aleve   __X__ Stop all herbals and supplements, fish oil or vitamins for 1 week prior to surgery    ____ Bring C-Pap to the hospital.    Continue Tylenol as needed

## 2021-02-21 ENCOUNTER — Ambulatory Visit: Payer: Medicare HMO | Admitting: Anesthesiology

## 2021-02-21 ENCOUNTER — Other Ambulatory Visit: Payer: Self-pay

## 2021-02-21 ENCOUNTER — Encounter: Payer: Self-pay | Admitting: Surgery

## 2021-02-21 ENCOUNTER — Encounter
Admission: RE | Disposition: A | Discharge status: Home / Self Care | Payer: Self-pay | Source: Ambulatory Visit | Attending: Surgery

## 2021-02-21 ENCOUNTER — Ambulatory Visit
Admission: RE | Admit: 2021-02-21 | Discharge: 2021-02-21 | Disposition: A | Discharge status: Home / Self Care | Payer: Medicare HMO | Source: Ambulatory Visit | Attending: Surgery | Admitting: Surgery

## 2021-02-21 DIAGNOSIS — E119 Type 2 diabetes mellitus without complications: Secondary | ICD-10-CM | POA: Insufficient documentation

## 2021-02-21 DIAGNOSIS — I10 Essential (primary) hypertension: Secondary | ICD-10-CM | POA: Diagnosis not present

## 2021-02-21 DIAGNOSIS — F439 Reaction to severe stress, unspecified: Secondary | ICD-10-CM

## 2021-02-21 DIAGNOSIS — F419 Anxiety disorder, unspecified: Secondary | ICD-10-CM | POA: Diagnosis not present

## 2021-02-21 DIAGNOSIS — G5601 Carpal tunnel syndrome, right upper limb: Secondary | ICD-10-CM | POA: Diagnosis not present

## 2021-02-21 DIAGNOSIS — I208 Other forms of angina pectoris: Secondary | ICD-10-CM | POA: Diagnosis not present

## 2021-02-21 DIAGNOSIS — F5104 Psychophysiologic insomnia: Secondary | ICD-10-CM

## 2021-02-21 HISTORY — PX: CARPAL TUNNEL RELEASE: SHX101

## 2021-02-21 LAB — GLUCOSE, CAPILLARY
Glucose-Capillary: 144 mg/dL — ABNORMAL HIGH (ref 70–99)
Glucose-Capillary: 177 mg/dL — ABNORMAL HIGH (ref 70–99)

## 2021-02-21 SURGERY — RELEASE, CARPAL TUNNEL, ENDOSCOPIC
Anesthesia: Regional | Site: Wrist | Laterality: Right

## 2021-02-21 MED ORDER — ONDANSETRON HCL 4 MG PO TABS: 4.0000 mg | ORAL_TABLET | Freq: Four times a day (QID) | ORAL | Status: DC | PRN

## 2021-02-21 MED ORDER — PROMETHAZINE HCL 25 MG/ML IJ SOLN: 6.2500 mg | INTRAMUSCULAR | Status: DC | PRN

## 2021-02-21 MED ORDER — FENTANYL CITRATE (PF) 100 MCG/2ML IJ SOLN
INTRAMUSCULAR | Status: DC | PRN
  Administered 2021-02-21: 50 ug via INTRAVENOUS
  Administered 2021-02-21 (×2): 25 ug via INTRAVENOUS

## 2021-02-21 MED ORDER — MIDAZOLAM HCL 2 MG/2ML IJ SOLN
INTRAMUSCULAR | Status: DC | PRN
  Administered 2021-02-21 (×2): 1 mg via INTRAVENOUS

## 2021-02-21 MED ORDER — SODIUM CHLORIDE 0.9 % IR SOLN
Status: DC | PRN
  Administered 2021-02-21: 500 mL

## 2021-02-21 MED ORDER — BUPIVACAINE HCL (PF) 0.5 % IJ SOLN
INTRAMUSCULAR | Status: AC
  Filled 2021-02-21: qty 30

## 2021-02-21 MED ORDER — SODIUM CHLORIDE 0.9 % IV SOLN: INTRAVENOUS | Status: DC

## 2021-02-21 MED ORDER — FLUOXETINE HCL 10 MG PO CAPS: 10.0000 mg | ORAL_CAPSULE | Freq: Every day | ORAL | Status: DC | PRN

## 2021-02-21 MED ORDER — BUPIVACAINE HCL (PF) 0.5 % IJ SOLN
INTRAMUSCULAR | Status: DC | PRN
  Administered 2021-02-21: 10 mL

## 2021-02-21 MED ORDER — LIDOCAINE HCL (CARDIAC) PF 100 MG/5ML IV SOSY
PREFILLED_SYRINGE | INTRAVENOUS | Status: DC | PRN
  Administered 2021-02-21: 250 mg via INTRAVENOUS

## 2021-02-21 MED ORDER — CHLORHEXIDINE GLUCONATE 0.12 % MT SOLN
OROMUCOSAL | Status: AC
  Administered 2021-02-21: 15 mL via OROMUCOSAL
  Filled 2021-02-21: qty 15

## 2021-02-21 MED ORDER — CEFAZOLIN SODIUM-DEXTROSE 2-4 GM/100ML-% IV SOLN
2.0000 g | INTRAVENOUS | Status: AC
  Administered 2021-02-21: 2 g via INTRAVENOUS

## 2021-02-21 MED ORDER — ACETAMINOPHEN 10 MG/ML IV SOLN: 1000.0000 mg | Freq: Once | INTRAVENOUS | Status: DC | PRN

## 2021-02-21 MED ORDER — FENTANYL CITRATE (PF) 100 MCG/2ML IJ SOLN
INTRAMUSCULAR | Status: AC
  Filled 2021-02-21: qty 2

## 2021-02-21 MED ORDER — SODIUM CHLORIDE FLUSH 0.9 % IV SOLN
INTRAVENOUS | Status: AC
  Filled 2021-02-21: qty 10

## 2021-02-21 MED ORDER — CEFAZOLIN SODIUM-DEXTROSE 2-4 GM/100ML-% IV SOLN
INTRAVENOUS | Status: AC
  Filled 2021-02-21: qty 100

## 2021-02-21 MED ORDER — ACETAMINOPHEN 500 MG PO TABS: 1000.0000 mg | ORAL_TABLET | Freq: Once | ORAL | Status: DC

## 2021-02-21 MED ORDER — METOCLOPRAMIDE HCL 5 MG/ML IJ SOLN: 5.0000 mg | Freq: Three times a day (TID) | INTRAMUSCULAR | Status: DC | PRN

## 2021-02-21 MED ORDER — KETOROLAC TROMETHAMINE 30 MG/ML IJ SOLN
INTRAMUSCULAR | Status: DC | PRN
  Administered 2021-02-21: 15 mg via INTRAVENOUS

## 2021-02-21 MED ORDER — ONDANSETRON HCL 4 MG/2ML IJ SOLN: 4.0000 mg | Freq: Four times a day (QID) | INTRAMUSCULAR | Status: DC | PRN

## 2021-02-21 MED ORDER — MIDAZOLAM HCL 2 MG/2ML IJ SOLN
INTRAMUSCULAR | Status: AC
  Filled 2021-02-21: qty 2

## 2021-02-21 MED ORDER — METOCLOPRAMIDE HCL 10 MG PO TABS: 5.0000 mg | ORAL_TABLET | Freq: Three times a day (TID) | ORAL | Status: DC | PRN

## 2021-02-21 MED ORDER — METFORMIN HCL 1000 MG PO TABS: 1000.0000 mg | ORAL_TABLET | Freq: Every day | ORAL | Status: DC

## 2021-02-21 MED ORDER — CHLORHEXIDINE GLUCONATE 0.12 % MT SOLN: 15.0000 mL | Freq: Once | OROMUCOSAL | Status: AC

## 2021-02-21 MED ORDER — ORAL CARE MOUTH RINSE
15.0000 mL | Freq: Once | OROMUCOSAL | Status: AC
Start: 2021-02-21 — End: 2021-02-21

## 2021-02-21 MED ORDER — KETOROLAC TROMETHAMINE 30 MG/ML IJ SOLN
INTRAMUSCULAR | Status: AC
  Filled 2021-02-21: qty 1

## 2021-02-21 MED ORDER — PROPOFOL 10 MG/ML IV BOLUS
INTRAVENOUS | Status: AC
  Filled 2021-02-21: qty 20

## 2021-02-21 SURGICAL SUPPLY — 32 items
APL PRP STRL LF DISP 70% ISPRP (MISCELLANEOUS) ×1
BNDG COHESIVE 4X5 TAN ST LF (GAUZE/BANDAGES/DRESSINGS) ×2 IMPLANT
BNDG ELASTIC 2X5.8 VLCR STR LF (GAUZE/BANDAGES/DRESSINGS) ×2 IMPLANT
BNDG ESMARK 4X12 TAN STRL LF (GAUZE/BANDAGES/DRESSINGS) ×2 IMPLANT
CHLORAPREP W/TINT 26 (MISCELLANEOUS) ×2 IMPLANT
CORD BIP STRL DISP 12FT (MISCELLANEOUS) ×2 IMPLANT
CUFF TOURN SGL QUICK 18X4 (TOURNIQUET CUFF) ×2 IMPLANT
DRAPE SURG 17X11 SM STRL (DRAPES) ×2 IMPLANT
FORCEPS JEWEL BIP 4-3/4 STR (INSTRUMENTS) ×2 IMPLANT
GAUZE SPONGE 4X4 12PLY STRL (GAUZE/BANDAGES/DRESSINGS) ×2 IMPLANT
GAUZE XEROFORM 1X8 LF (GAUZE/BANDAGES/DRESSINGS) ×2 IMPLANT
GLOVE SURG ENC MOIS LTX SZ8 (GLOVE) ×2 IMPLANT
GLOVE SURG UNDER LTX SZ8 (GLOVE) ×2 IMPLANT
GOWN STRL REUS W/ TWL LRG LVL3 (GOWN DISPOSABLE) ×1 IMPLANT
GOWN STRL REUS W/ TWL XL LVL3 (GOWN DISPOSABLE) ×1 IMPLANT
GOWN STRL REUS W/TWL LRG LVL3 (GOWN DISPOSABLE) ×2
GOWN STRL REUS W/TWL XL LVL3 (GOWN DISPOSABLE) ×2
KIT CARPAL TUNNEL (MISCELLANEOUS) ×2
KIT ESCP INSRT D SLOT CANN KN (MISCELLANEOUS) ×1 IMPLANT
KIT TURNOVER KIT A (KITS) ×2 IMPLANT
MANIFOLD NEPTUNE II (INSTRUMENTS) ×2 IMPLANT
NS IRRIG 500ML POUR BTL (IV SOLUTION) ×2 IMPLANT
PACK EXTREMITY ARMC (MISCELLANEOUS) ×2 IMPLANT
SPLINT WRIST LG LT TX990309 (SOFTGOODS) IMPLANT
SPLINT WRIST LG RT TX900304 (SOFTGOODS) IMPLANT
SPLINT WRIST M LT TX990308 (SOFTGOODS) IMPLANT
SPLINT WRIST M RT TX990303 (SOFTGOODS) IMPLANT
SPLINT WRIST XL LT TX990310 (SOFTGOODS) IMPLANT
SPLINT WRIST XL RT TX990305 (SOFTGOODS) IMPLANT
STOCKINETTE IMPERVIOUS 9X36 MD (GAUZE/BANDAGES/DRESSINGS) ×2 IMPLANT
SUT PROLENE 4 0 PS 2 18 (SUTURE) ×2 IMPLANT
WATER STERILE IRR 500ML POUR (IV SOLUTION) ×2 IMPLANT

## 2021-02-21 NOTE — Discharge Instructions (Addendum)
Orthopedic discharge instructions: Keep dressing dry and intact. Keep hand elevated above heart level. May shower after dressing removed on postop day 4 (Sunday). Cover sutures with Band-Aids after drying off. Apply ice to affected area frequently. Take ibuprofen 600 mg TID with meals for 3-5 days, then as necessary. Take ES Tylenol when needed.  Return for follow-up in 10-14 days or as scheduled.   AMBULATORY SURGERY  DISCHARGE INSTRUCTIONS   The drugs that you were given will stay in your system until tomorrow so for the next 24 hours you should not:  Drive an automobile Make any legal decisions Drink any alcoholic beverage   You may resume regular meals tomorrow.  Today it is better to start with liquids and gradually work up to solid foods.  You may eat anything you prefer, but it is better to start with liquids, then soup and crackers, and gradually work up to solid foods.   Please notify your doctor immediately if you have any unusual bleeding, trouble breathing, redness and pain at the surgery site, drainage, fever, or pain not relieved by medication.    Additional Instructions:        Please contact your physician with any problems or Same Day Surgery at [phone], Monday through Friday 6 am to 4 pm, or [place] [facility] number at [phone].

## 2021-02-21 NOTE — Anesthesia Preprocedure Evaluation (Addendum)
Anesthesia Evaluation  Patient identified by MRN, date of birth, ID band Patient awake    Reviewed: Allergy & Precautions, NPO status , Patient's Chart, lab work & pertinent test results  Airway Mallampati: II  TM Distance: >3 FB Neck ROM: full    Dental  (+) Edentulous Upper   Pulmonary neg pulmonary ROS,    Pulmonary exam normal        Cardiovascular Exercise Tolerance: Good hypertension, Pt. on medications + angina (stable) Normal cardiovascular exam  ECHO 11/22: NORMAL LEFT VENTRICULAR SYSTOLIC FUNCTION  NORMAL RIGHT VENTRICULAR SYSTOLIC FUNCTION  NO VALVULAR STENOSIS  MILD MR, TR, PR  EF 55%    MPS 10/22: Regional wall motion: reveals normal myocardial thickening and wall  motion.  The overall quality of the study is good.   Artifacts noted: no  Left ventricular cavity: normal.    10/22: EKG This result has an attachment that is not available.  Normal sinus rhythm  Nonspecific ST and T wave abnormality  Abnormal ECG  No previous ECGs available  I reviewed and concur with this report. Electronically signed XQ:JJHERDEY MD, Darnell Level ([id number]) on 11/14/2020 9:53:56 AM    Neuro/Psych PSYCHIATRIC DISORDERS Anxiety CARPAL TUNNEL SYNDROME, RIGHT    GI/Hepatic Neg liver ROS, GERD  Medicated,  Endo/Other  diabetes (A1C 6.7), Well Controlled, Type 2  Renal/GU      Musculoskeletal  (+) Arthritis ,   Abdominal Normal abdominal exam  (+)   Peds  Hematology negative hematology ROS (+)   Anesthesia Other Findings Past Medical History: No date: Anemia 08/08/2014: Anemia, iron deficiency No date: Anxiety No date: Diabetes mellitus without complication (HCC) No date: GERD (gastroesophageal reflux disease) No date: History of chicken pox No date: History of skin cancer No date: Hyperlipidemia No date: Hypertension No date: Skin cancer  Past Surgical History: No date: ABDOMINAL HYSTERECTOMY No date: CATARACT  EXTRACTION; Bilateral 03/2013: Prolapsed bladder     Comment:  Dr. Zigmund Daniel 01/2011: REPLACEMENT TOTAL KNEE; Left     Comment:  [facility] 03/2010: REPLACEMENT TOTAL KNEE; Right 1974: TOTAL ABDOMINAL HYSTERECTOMY W/ BILATERAL SALPINGOOPHORECTOMY     Comment:  BSO  BMI    Body Mass Index: 28.35 kg/m      Reproductive/Obstetrics negative OB ROS                            Anesthesia Physical Anesthesia Plan  ASA: 2  Anesthesia Plan: Bier Block and Bier Block-LIDOCAINE ONLY   Post-op Pain Management: Minimal or no pain anticipated   Induction: Intravenous  PONV Risk Score and Plan: Dexamethasone, Ondansetron, Midazolam and Treatment may vary due to age or medical condition  Airway Management Planned: Natural Airway and Nasal Cannula  Additional Equipment:   Intra-op Plan:   Post-operative Plan:   Informed Consent: I have reviewed the patients History and Physical, chart, labs and discussed the procedure including the risks, benefits and alternatives for the proposed anesthesia with the patient or authorized representative who has indicated his/her understanding and acceptance.     Dental Advisory Given  Plan Discussed with: Anesthesiologist, CRNA and Surgeon  Anesthesia Plan Comments:        Anesthesia Quick Evaluation

## 2021-02-21 NOTE — H&P (Signed)
History of Present Illness:  Natalie Bray is a 87 y.o. female who presents for evaluation and treatment of her right hand pain and paresthesias. The patient notes that the symptoms have been present for over a year and developed without any specific cause or injury. She first sought treatment in March, 2022. She saw Vance Peper, PA, who fitted her with a carpal tunnel splint and sent her for EMG studies. The patient was seen back by Vance Peper, PA-C, in May, 2022. At this visit, he gave her a carpal tunnel injection which she states was quite beneficial, lasting 3 to 4 months before her symptoms began to recur. She followed up with Vance Peper, PA-C, last month who referred her to me for further evaluation and treatment. On today's visit, she denies any pain in the wrist, but will take Tylenol as necessary with relief. She notes that her symptoms are worse at night, frequently awakening her from sleep. She also has discomfort during the day, especially with activities requiring her to flex her wrist or to curl her fingers, such as when performing repetitive activities with her hands. She describes numbness and paresthesias extending to all digits.  Current Outpatient Medications:  ACCU-CHEK AVIVA PLUS TEST STRP test strip   acetaminophen (TYLENOL) 325 MG tablet Take 650 mg by mouth as needed for Pain.   amLODIPine (NORVASC) 10 MG tablet Take 10 mg by mouth once daily.   amoxicillin-clavulanate (AUGMENTIN) 875-125 mg tablet   aspirin 81 MG chewable tablet Take 1 tablet (81 mg total) by mouth once daily   benzonatate (TESSALON) 100 MG capsule Take 1 capsule (100 mg total) by mouth 3 (three) times daily as needed   blood glucose meter kit Use as instructed   calcium carbonate-vitamin D3 (OS-CAL 500+D) 500 mg(1,261m) -200 unit tablet Take 1 tablet by mouth once daily   cholecalciferol (VITAMIN D3) 400 unit tablet Take 1 tablet (400 Units total) by mouth once daily For vitamin D deficiency.   cloNIDine HCL  (CATAPRES) 0.1 MG tablet Take 1 tablet by mouth once daily   docusate (COLACE) 100 MG capsule Take by mouth as needed   FLUoxetine (PROZAC) 10 MG capsule Take 1 capsule by mouth once daily   glipiZIDE (GLUCOTROL XL) 5 MG XL tablet Take 1 tablet by mouth daily with breakfast   lovastatin (MEVACOR) 40 MG tablet Take 1 tablet (40 mg total) by mouth once daily   metFORMIN (GLUCOPHAGE) 1000 MG tablet Take 1 tablet (1,000 mg total) by mouth once daily   multivitamin,tx-minerals Cap Take 1 capsule by mouth once daily.   omega-3 fatty acids 1,000 mg capsule Take 1 capsule (1 g total) by mouth once daily   omeprazole (PRILOSEC) 20 MG DR capsule 1 capsule (20 mg total) once daily   spironolactone (ALDACTONE) 25 MG tablet Take 1 tablet (25 mg total) by mouth once daily   valsartan-hydrochlorothiazide (DIOVAN-HCT) 320-25 mg tablet Take 1 tablet by mouth once daily 90 tablet 4   vitamin E 400 UNIT capsule Take 1 capsule (400 Units total) by mouth once daily   Allergies:   Celebrex [Celecoxib] Other (flushing)   Past Medical History:   Bell's palsy   Cataract cortical, senile   Chickenpox   Diabetes mellitus type 2, uncomplicated (CMS-HCC)   Hyperlipidemia   Hypertension   Shingles   Skin cancer   Past Surgical History:   KNEE ARTHROSCOPY 11/22/2009 (Left, partial medial and lateral meniscectomies, medial and lateral chondroplasties and removal of osteochondral loose body)  JOINT REPLACEMENT 04/02/2010 (Right total knee arthroplasty)   JOINT REPLACEMENT 02/11/2011 (Left knee arthroplasty)   COLONOSCOPY 09/23/2013 (Normal - no repeat per Dr. Rayann Heman)   EGD 09/23/2013 (Fundic Gland Polyps & Nl Bx's - no repeat Dr. Rayann Heman)   APPENDECTOMY at hysterectomy   HYSTERECTOMY   Family History:   Kidney failure Mother   Skin cancer Other   Diabetes type II Other   High blood pressure (Hypertension) Other   Social History:   Socioeconomic History:   Marital status: Widowed  Tobacco Use   Smoking status:  Never   Smokeless tobacco: Never  Vaping Use   Vaping Use: Never used  Substance and Sexual Activity   Alcohol use: No  Alcohol/week: 0.0 standard drinks   Drug use: No   Sexual activity: Not Currently  Partners: Male   Review of Systems:  A comprehensive 14 point ROS was performed, reviewed, and the pertinent orthopaedic findings are documented in the HPI.  Physical Exam: Vitals:  01/31/21 1152  Weight: 72.1 kg (159 lb)  Height: 157.5 cm (_0 )  PainSc: 0-No pain  PainLoc: Wrist   General/Constitutional: The patient appears to be well-nourished, well-developed, and in no acute distress. Neuro/Psych: Normal mood and affect, oriented to person, place and time. Eyes: Non-icteric. Pupils are equal, round, and reactive to light, and exhibit synchronous movement. ENT: Unremarkable. Lymphatic: No palpable adenopathy. Respiratory: Lungs clear to auscultation, Normal chest excursion, No wheezes and Non-labored breathing Cardiovascular: Regular rate and rhythm. No murmurs. and No edema, swelling or tenderness, except as noted in detailed exam. Integumentary: No impressive skin lesions present, except as noted in detailed exam. Musculoskeletal: Unremarkable, except as noted in detailed exam.  Right wrist/hand exam: Skin inspection of the right wrist and hand is unremarkable. No swelling, erythema, ecchymosis, abrasions, or other skin abnormalities are identified. She has no tenderness to palpation over the dorsal or volar aspects of the wrist or hand. She exhibits full active and passive range of motion of the wrist without any pain or catching. She is able to flex and extend all digits fully without any pain or triggering. She is neurovascularly intact to all digits, other than some slightly decreased sensation to light touch to the tips of her thumb, index, long, and ring fingers. She exhibits a positive Phalen's test and an equivocally positive Tinel's sign over the carpal tunnel.  EMG  results:  A recent EMG of the right upper extremity confirms the presence of moderately severe carpal tunnel syndrome involving the right wrist. This report was reviewed by myself and discussed with the patient.  Assessment:  Carpal tunnel syndrome, right.   Plan: The treatment options were discussed with the patient. In addition, patient educational materials were provided regarding the diagnosis and treatment options. The patient is quite frustrated by her symptoms and functional limitations, and is ready to consider more aggressive treatment options. Therefore, I have recommended a surgical procedure, specifically an endoscopic right carpal tunnel release. The procedure was discussed with the patient, as were the potential risks (including bleeding, infection, nerve and/or blood vessel injury, persistent or recurrent pain/paresthesias, weakness of grip, need for further surgery, blood clots, strokes, heart attacks and/or arhythmias, pneumonia, etc.) and benefits. The patient states his/her understanding and wishes to proceed. All of the patient's questions and concerns were answered. She can call any time with further concerns. She will follow up post-surgery, routine.   H&P reviewed and patient re-examined. No changes.

## 2021-02-21 NOTE — Transfer of Care (Signed)
Immediate Anesthesia Transfer of Care Note  Patient: Natalie Bray  Procedure(s) Performed: CARPAL TUNNEL RELEASE ENDOSCOPIC (Right: Wrist)  Patient Location: PACU  Anesthesia Type:Bier block  Level of Consciousness: awake, alert  and oriented  Airway & Oxygen Therapy: Patient Spontanous Breathing and Patient connected to nasal cannula oxygen  Post-op Assessment: Report given to RN and Post -op Vital signs reviewed and stable  Post vital signs: Reviewed and stable  Last Vitals:  Vitals Value Taken Time  BP 130/63 02/21/21 0930  Temp 36.7 C 02/21/21 0922  Pulse 81 02/21/21 0933  Resp 16 02/21/21 0933  SpO2 96 % 02/21/21 0933    Last Pain:  Vitals:   02/21/21 0922  TempSrc:   PainSc: 0-No pain         Complications: No notable events documented.

## 2021-02-21 NOTE — Anesthesia Procedure Notes (Signed)
Anesthesia Regional Block: Bier block (IV Regional)   Pre-Anesthetic Checklist: , timeout performed,  Correct Patient, Correct Site, Correct Laterality,  Correct Procedure, Correct Position, site marked,  Risks and benefits discussed,  Surgical consent,  Pre-op evaluation,  At surgeon's request and post-op pain management  Laterality: Upper and Left  Prep: chloraprep        Procedures:,,,,, intact distal pulses, Esmarch exsanguination,  Double tourniquet utilized    Narrative:  Start time: 02/21/2021 8:34 AM End time: 02/21/2021 8:36 AM Injection made incrementally with aspirations every 50 mL.  Performed by: Personally  Anesthesiologist: Iran Ouch, MD

## 2021-02-21 NOTE — Op Note (Signed)
02/21/2021  9:27 AM  Patient:   Natalie Bray  Pre-Op Diagnosis:   Right carpal tunnel syndrome.  Post-Op Diagnosis:   Same.  Procedure:   Endoscopic right carpal tunnel release.  Surgeon:   Pascal Lux, MD  Anesthesia:   Bier block  Findings:   As above.  Complications:   None  EBL:   0 cc  Fluids:   500 cc crystalloid  TT:   25 minutes at 250 mmHg  Drains:   None  Closure:   4-0 Prolene interrupted sutures  Brief Clinical Note:   The patient is an 87 year old female with a history of progressive worsening pain and paresthesias to her right hand. Her symptoms have progressed despite medications, activity modification, etc. Her history and examination are consistent with carpal tunnel syndrome, confirmed by EMG. The patient presents at this time for an endoscopic right carpal tunnel release.   Procedure:   The patient was brought into the operating room and lain in the supine position. After performing a timeout to verify the appropriate surgical site, a Bier block was performed by the anesthesiologist and the tourniquet inflated to 250 mmHg. The right hand and upper extremity were prepped with ChloraPrep solution before being draped sterilely. Preoperative antibiotics were administered. A second timeout was performed to verify the appropriate surgical site.   An approximately 1.5-2 cm incision was made over the volar wrist flexion crease, centered over the palmaris longus tendon. The incision was carried down through the subcutaneous tissues with care taken to identify and protect any neurovascular structures. The distal forearm fascia was penetrated just proximal to the transverse carpal ligament. The soft tissues were released off the superficial and deep surfaces of the distal forearm fascia and this was released proximally for 3-4 cm under direct visualization.  Attention was directed distally. The Soil scientist was passed beneath the transverse carpal ligament along  the ulnar aspect of the carpal tunnel and used to release any adhesions as well as to remove any adherent synovial tissue before first the smaller then the larger of the two dilators were passed beneath the transverse carpal ligament along the ulnar margin of the carpal tunnel. The slotted cannula was introduced and the endoscope was placed into the slotted cannula and the undersurface of the transverse carpal ligament visualized. The distal margin of the transverse carpal ligament was marked by placing a 25-gauge needle percutaneously at [place] cardinal point so that it entered the distal portion of the slotted cannula. Under endoscopic visualization, the transverse carpal ligament was released from proximal to distal using the end-cutting blade. A second pass was performed to ensure complete release of the ligament. The adequacy of release was verified both endoscopically and by palpation using the freer elevator.  The wound was irrigated thoroughly with sterile saline solution before being closed using 4-0 Prolene interrupted sutures. A total of 10 cc of 0.5% plain Sensorcaine was injected in and around the incision before a sterile bulky dressing was applied to the wound. The patient was placed into a volar wrist splint before being awakened and returned to the recovery room in satisfactory condition after tolerating the procedure well.

## 2021-02-21 NOTE — Anesthesia Postprocedure Evaluation (Signed)
Anesthesia Post Note  Patient: Natalie Bray  Procedure(s) Performed: CARPAL TUNNEL RELEASE ENDOSCOPIC (Right: Wrist)  Patient location during evaluation: PACU Anesthesia Type: Bier Block Level of consciousness: awake and alert Pain management: pain level controlled Vital Signs Assessment: post-procedure vital signs reviewed and stable Respiratory status: spontaneous breathing, nonlabored ventilation and respiratory function stable Cardiovascular status: stable and blood pressure returned to baseline Postop Assessment: no apparent nausea or vomiting Anesthetic complications: no   No notable events documented.   Last Vitals:  Vitals:   02/21/21 0941 02/21/21 0954  BP: 128/61 (!) 145/63  Pulse: 74 85  Resp: 17 16  Temp: 36.9 C 36.4 C  SpO2: 95% 95%    Last Pain:  Vitals:   02/21/21 0954  TempSrc: Temporal  PainSc: 0-No pain                 Iran Ouch

## 2021-02-22 ENCOUNTER — Encounter: Payer: Self-pay | Admitting: Surgery

## 2021-03-05 DIAGNOSIS — Z9889 Other specified postprocedural states: Secondary | ICD-10-CM | POA: Insufficient documentation

## 2021-05-07 DIAGNOSIS — D0461 Carcinoma in situ of skin of right upper limb, including shoulder: Secondary | ICD-10-CM | POA: Diagnosis not present

## 2021-05-07 DIAGNOSIS — D044 Carcinoma in situ of skin of scalp and neck: Secondary | ICD-10-CM | POA: Diagnosis not present

## 2021-05-07 DIAGNOSIS — Z85828 Personal history of other malignant neoplasm of skin: Secondary | ICD-10-CM | POA: Diagnosis not present

## 2021-05-07 DIAGNOSIS — D2261 Melanocytic nevi of right upper limb, including shoulder: Secondary | ICD-10-CM | POA: Diagnosis not present

## 2021-05-07 DIAGNOSIS — X32XXXA Exposure to sunlight, initial encounter: Secondary | ICD-10-CM | POA: Diagnosis not present

## 2021-05-07 DIAGNOSIS — L570 Actinic keratosis: Secondary | ICD-10-CM | POA: Diagnosis not present

## 2021-05-07 DIAGNOSIS — D2262 Melanocytic nevi of left upper limb, including shoulder: Secondary | ICD-10-CM | POA: Diagnosis not present

## 2021-05-07 DIAGNOSIS — D2271 Melanocytic nevi of right lower limb, including hip: Secondary | ICD-10-CM | POA: Diagnosis not present

## 2021-05-07 DIAGNOSIS — D485 Neoplasm of uncertain behavior of skin: Secondary | ICD-10-CM | POA: Diagnosis not present

## 2021-05-30 DIAGNOSIS — I10 Essential (primary) hypertension: Secondary | ICD-10-CM | POA: Diagnosis not present

## 2021-05-30 DIAGNOSIS — E782 Mixed hyperlipidemia: Secondary | ICD-10-CM | POA: Diagnosis not present

## 2021-05-30 DIAGNOSIS — E119 Type 2 diabetes mellitus without complications: Secondary | ICD-10-CM | POA: Diagnosis not present

## 2021-05-30 DIAGNOSIS — I6523 Occlusion and stenosis of bilateral carotid arteries: Secondary | ICD-10-CM | POA: Diagnosis not present

## 2021-06-05 ENCOUNTER — Encounter: Payer: Self-pay | Admitting: Family Medicine

## 2021-06-05 ENCOUNTER — Ambulatory Visit (INDEPENDENT_AMBULATORY_CARE_PROVIDER_SITE_OTHER): Payer: Medicare HMO | Admitting: Family Medicine

## 2021-06-05 VITALS — BP 137/49 | HR 65 | Temp 97.6°F | Resp 14 | Ht 62.01 in | Wt 162.0 lb

## 2021-06-05 DIAGNOSIS — N181 Chronic kidney disease, stage 1: Secondary | ICD-10-CM | POA: Diagnosis not present

## 2021-06-05 DIAGNOSIS — Z289 Immunization not carried out for unspecified reason: Secondary | ICD-10-CM

## 2021-06-05 DIAGNOSIS — Z0000 Encounter for general adult medical examination without abnormal findings: Secondary | ICD-10-CM | POA: Diagnosis not present

## 2021-06-05 DIAGNOSIS — M810 Age-related osteoporosis without current pathological fracture: Secondary | ICD-10-CM | POA: Diagnosis not present

## 2021-06-05 DIAGNOSIS — E785 Hyperlipidemia, unspecified: Secondary | ICD-10-CM

## 2021-06-05 DIAGNOSIS — I10 Essential (primary) hypertension: Secondary | ICD-10-CM | POA: Diagnosis not present

## 2021-06-05 DIAGNOSIS — E871 Hypo-osmolality and hyponatremia: Secondary | ICD-10-CM | POA: Diagnosis not present

## 2021-06-05 DIAGNOSIS — E1122 Type 2 diabetes mellitus with diabetic chronic kidney disease: Secondary | ICD-10-CM | POA: Diagnosis not present

## 2021-06-05 MED ORDER — SPIRONOLACTONE 50 MG PO TABS: 50.0000 mg | ORAL_TABLET | Freq: Every day | ORAL | 2 refills | Status: DC

## 2021-06-05 MED ORDER — SHINGRIX 50 MCG/0.5ML IM SUSR: 0.5000 mL | Freq: Once | INTRAMUSCULAR | 0 refills | Status: AC

## 2021-06-05 NOTE — Progress Notes (Signed)
? ? ? ?Annual Wellness Visit ? ?  ? ?Patient: Natalie Bray, Female    DOB: 27-Nov-1934, 87 y.o.   MRN: [id number] ?Visit Date: 06/05/2021 ? ?Today's Provider: Lelon Huh, MD  ? ?Chief Complaint  ?Patient presents with  ? Medicare Wellness  ? Diabetes  ? Hypertension  ? ?Subjective  ?  ?Natalie Bray is a 87 y.o. female who presents today for her Annual Wellness Visit. ? ? ?Medications: ?Outpatient Medications Prior to Visit  ?Medication Sig  ? acetaminophen (TYLENOL) 500 MG tablet Take 1,000 mg by mouth every 6 (six) hours as needed for moderate pain.  ? amLODipine (NORVASC) 10 MG tablet TAKE 1 TABLET EVERY DAY  ? aspirin 81 MG chewable tablet Chew 81 mg by mouth daily.  ? calcium-vitamin D (OSCAL WITH D) 500-200 MG-UNIT tablet Take 1 tablet by mouth daily.  ? Cholecalciferol (VITAMIN D3) 50 MCG (2000 UT) TABS Take 1 tablet by mouth daily.  ? cloNIDine (CATAPRES) 0.1 MG tablet TAKE 1 TABLET AS NEEDED FOR BLOOD PRESSURE OVER 180. MAY REPEAT IN 30 MINUTES. DO NOT TAKE MORE THAN 3 IN A DAY  ? docusate sodium (COLACE) 100 MG capsule Take 100 mg by mouth daily.  ? FLUoxetine (PROZAC) 10 MG capsule Take 1 capsule (10 mg total) by mouth daily as needed (anxiety).  ? glipiZIDE (GLUCOTROL XL) 5 MG 24 hr tablet TAKE 1 TABLET EVERY DAY WITH BREAKFAST  ? lovastatin (MEVACOR) 40 MG tablet TAKE 1 TABLET EVERY DAY  ? metFORMIN (GLUCOPHAGE) 1000 MG tablet Take 1 tablet (1,000 mg total) by mouth daily.  ? Omega-3 1000 MG CAPS Take 1,000 mg by mouth daily.  ? omeprazole (PRILOSEC) 20 MG capsule TAKE 1 CAPSULE EVERY DAY  ? traZODone (DESYREL) 100 MG tablet Take 0.5-1 tablets (50-100 mg total) by mouth at bedtime as needed for sleep.  ? TRUE METRIX BLOOD GLUCOSE TEST test strip TEST AS DIRECTED  ? TRUEplus Lancets 33G MISC TEST BLOOD SUGAR EVERY DAY  ? valsartan-hydrochlorothiazide (DIOVAN-HCT) 320-25 MG tablet Take 1 tablet by mouth daily.  ? vitamin E 180 MG (400 UNITS) capsule Take 1 capsule by mouth daily.  ? [DISCONTINUED]  spironolactone (ALDACTONE) 25 MG tablet Take 2 tablets (50 mg total) by mouth daily.  ? ?No facility-administered medications prior to visit.  ?  ?Allergies  ?Allergen Reactions  ? Celecoxib Rash and Other (See Comments)  ?  Flushed red  ? ? ?Patient Care Team: ?Birdie Sons, MD as PCP - General (Family Medicine) ?Dasher, Rayvon Char, MD (Dermatology) ?Anell Barr, OD (Optometry) ?Hyatt, Max T, DPM as Veterinary surgeon) ?Poggi, Marshall Cork, MD as Consulting Physician (Orthopedic Surgery) ? ?Review of Systems ? ? ?  ? Objective  ?  ? ?Most recent functional status assessment: ? ?  06/05/2021  ?  9:05 AM  ?In your present state of health, do you have any difficulty performing the following activities:  ?Hearing? 0  ?Vision? 0  ?Difficulty concentrating or making decisions? 0  ?Walking or climbing stairs? 0  ?Dressing or bathing? 0  ?Doing errands, shopping? 0  ? ?Most recent fall risk assessment: ? ?  06/05/2021  ?  9:04 AM  ?Fall Risk   ?Falls in the past year? 0  ?Number falls in past yr: 0  ?Injury with Fall? 0  ?Risk for fall due to : No Fall Risks  ?Follow up Falls evaluation completed  ? ? Most recent depression screenings: ? ?  06/05/2021  ?  9:04 AM 02/02/2021  ?  8:52 AM  ?PHQ 2/9 Scores  ?PHQ - 2 Score 0 0  ?PHQ- 9 Score 1 0  ? ?Most recent cognitive screening: ? ?  12/08/2019  ? 11:19 AM  ?6CIT Screen  ?What Year? 0 points  ?What month? 0 points  ?What time? 0 points  ?Count back from 20 0 points  ?Months in reverse 0 points  ?Repeat phrase 2 points  ?Total Score 2 points  ? ?Most recent Audit-C alcohol use screening ? ?  06/05/2021  ?  9:04 AM  ?Alcohol Use Disorder Test (AUDIT)  ?1. How often do you have a drink containing alcohol? 0  ?2. How many drinks containing alcohol do you have on a typical day when you are drinking? 0  ?3. How often do you have six or more drinks on one occasion? 0  ?AUDIT-C Score 0  ? ?A score of 3 or more in women, and 4 or more in men indicates increased risk for alcohol  abuse, EXCEPT if all of the points are from question 1  ? ?No results found for any visits on 06/05/21. ? Assessment & Plan  ?  ? ?Annual wellness visit done today including the all of the following: ?Reviewed patient's Family Medical History ?Reviewed and updated list of patient's medical providers ?Assessment of cognitive impairment was done ?Assessed patient's functional ability ?Established a written schedule for health screening services ?Health Risk Assessent Completed and Reviewed ? ?Exercise Activities and Dietary recommendations ? Goals   ? ?   "I'm doing well I think" (pt-stated)   ?   Current Barriers:  ?High pill burden ?adherence ? ?Pharmacist Clinical Goal(s):  ?Over the next 90 days, patient will continue to take all medications as prescribed, with adherence indicated by patient report and pharmacy fill history ? ?Interventions: ?Comprehensive medication review ?Updated EMR medication list ?Emphasized importance of high blood pressure and cholesterol medications ?Counseled patient on checking BP and blood glucose at home using home monitors (available through [place]) ? ?Patient Self Care Activities:  ?Self administers medications as prescribed ?Attends all scheduled provider appointments ?Calls pharmacy for medication refills ?Calls provider office for new concerns or questions ? ?Initial goal documentation ? ?  ?   DIET - INCREASE WATER INTAKE   ?   Recommend to drink at least 6-8 8oz glasses of water per day. ?  ? ?  ? ? ?Immunization History  ?Administered Date(s) Administered  ? Fluad Quad(high Dose 65+) 11/02/2018, 10/11/2019, 10/24/2020  ? Influenza, High Dose Seasonal PF 10/06/2014, 10/05/2015, 10/26/2016, 09/23/2017  ? PFIZER(Purple Top)SARS-COV-2 Vaccination 02/23/2019, 03/16/2019  ? Pneumococcal Conjugate-13 04/28/2013  ? Pneumococcal Polysaccharide-23 10/26/2011  ? ? ?Health Maintenance  ?Topic Date Due  ? TETANUS/TDAP  Never done  ? Zoster Vaccines- Shingrix (1 of 2) Never done   ? DEXA SCAN  03/14/2018  ? COVID-19 Vaccine (3 - Booster for Pfizer series) 05/11/2019  ? OPHTHALMOLOGY EXAM  04/19/2021  ? HEMOGLOBIN A1C  08/02/2021  ? INFLUENZA VACCINE  08/21/2021  ? Pneumonia Vaccine 58+ Years old  Completed  ? HPV VACCINES  Aged Out  ? ? ?Discussed health benefits of physical activity, and encouraged her to engage in regular exercise appropriate for her age and condition.  ? ?  ? ? ? ?Lelon Huh, MD  ?[facility] ?[phone] (phone) ?[phone] (fax) ? ?[facility]  ? ?

## 2021-06-05 NOTE — Patient Instructions (Signed)
Please review the attached list of medications and notify my office if there are any errors.  ? ?Please bring all of your medications to every appointment so we can make sure that our medication list is the same as yours.  ? ?I'm changing the spironolactone to '50mg'$  tablets. When the new prescription comes through the mail you will only take 1 tablet a day ?

## 2021-06-05 NOTE — Progress Notes (Signed)
? ? ? ?I,Natalie Bray,acting as a scribe for Natalie Bray.,have documented all relevant documentation on the behalf of Natalie Bray,as directed by  Natalie Bray while in the presence of Natalie Bray.  ? ? ?Complete Physical Exam  ? ?  ? ?Patient: Natalie Bray, Female    DOB: 21-Mar-1934, 87 y.o.   MRN: [id number] ?Visit Date: 06/05/2021 ? ?Today's Provider: Lelon Huh, Bray  ? ?Chief Complaint  ?Patient presents with  ? Medicare Wellness  ? Diabetes  ? Hypertension  ? ?Subjective  ?  ?Natalie Bray is a 87 y.o. female who presents today for her complete physical examination  ?She reports consuming a general diet. The patient does not participate in regular exercise at present. She generally feels fairly well. She reports sleeping fairly well. She does not have additional problems to discuss today.  ? ?HPI ?Diabetes Mellitus Type II, Follow-up ? ?Lab Results  ?Component Value Date  ? HGBA1C 6.7 (H) 02/02/2021  ? HGBA1C 6.8 (A) 10/24/2020  ? HGBA1C 7.0 (H) 06/21/2020  ? ?Wt Readings from Last 3 Encounters:  ?06/05/21 162 lb (73.5 kg)  ?02/21/21 154 lb 15.7 oz (70.3 kg)  ?02/13/21 155 lb (70.3 kg)  ? ?Last seen for diabetes 4 months ago.  ?Management since then includes continuing same treatment. ?She reports good compliance with treatment. ?She is not having side effects.  ?Symptoms: ?No fatigue No foot ulcerations  ?No appetite changes No nausea  ?No paresthesia of the feet  No polydipsia  ?No polyuria No visual disturbances   ?No vomiting   ? ? ?Home blood sugar records: fasting range: 140-150's ? ?Episodes of hypoglycemia? No  ?  ?Current insulin regiment: none ?Most Recent Eye Exam: >1 year ago ?Current exercise: none ?Current diet habits: well balanced ? ?Pertinent Labs: ?Lab Results  ?Component Value Date  ? CHOL 237 (H) 06/21/2020  ? HDL 62 06/21/2020  ? LDLCALC 114 (H) 06/21/2020  ? TRIG 355 (H) 06/21/2020  ? CHOLHDL 3.8 06/21/2020  ? Lab Results  ?Component Value Date  ? NA 129  (L) 02/02/2021  ? K 4.4 02/02/2021  ? CREATININE 0.84 02/02/2021  ? EGFR 68 02/02/2021  ? MICROALBUR 100 02/04/2018  ?  ? ?---------------------------------------------------------------------------------------------------  ? ?Hypertension, follow-up ? ?BP Readings from Last 3 Encounters:  ?06/05/21 (!) 137/49  ?02/21/21 (!) 145/63  ?02/02/21 (!) 128/52  ? Wt Readings from Last 3 Encounters:  ?06/05/21 162 lb (73.5 kg)  ?02/21/21 154 lb 15.7 oz (70.3 kg)  ?02/13/21 155 lb (70.3 kg)  ?  ? ?She was last seen for hypertension 4 months ago.  ?BP at that visit was 128/52. Management since that visit includes continue same medication. ? ?She reports good compliance with treatment. ?She is not having side effects.  ?She is following a Regular diet. ?She is not exercising. ?She does not smoke. ? ?Use of agents associated with hypertension: NSAIDS.  ? ?Outside blood pressures are checked and average 150/60-70. ?Symptoms: ?No chest pain No chest pressure  ?No palpitations No syncope  ?No dyspnea No orthopnea  ?No paroxysmal nocturnal dyspnea No lower extremity edema  ? ?She also continues routine follow up with Natalie Bray and was seen on 5/10 with blood pressure of 142/68 ? ?---------------------------------------------------------------------------------------------------  ? ? ?Medications: ?Outpatient Medications Prior to Visit  ?Medication Sig  ? acetaminophen (TYLENOL) 500 MG tablet Take 1,000 mg by mouth every 6 (six) hours as needed for moderate pain.  ? amLODipine (NORVASC) 10 MG  tablet TAKE 1 TABLET EVERY DAY  ? aspirin 81 MG chewable tablet Chew 81 mg by mouth daily.  ? calcium-vitamin D (OSCAL WITH D) 500-200 MG-UNIT tablet Take 1 tablet by mouth daily.  ? Cholecalciferol (VITAMIN D3) 50 MCG (2000 UT) TABS Take 1 tablet by mouth daily.  ? cloNIDine (CATAPRES) 0.1 MG tablet TAKE 1 TABLET AS NEEDED FOR BLOOD PRESSURE OVER 180. MAY REPEAT IN 30 MINUTES. DO NOT TAKE MORE THAN 3 IN A DAY  ? docusate sodium (COLACE) 100  MG capsule Take 100 mg by mouth daily.  ? FLUoxetine (PROZAC) 10 MG capsule Take 1 capsule (10 mg total) by mouth daily as needed (anxiety).  ? glipiZIDE (GLUCOTROL XL) 5 MG 24 hr tablet TAKE 1 TABLET EVERY DAY WITH BREAKFAST  ? lovastatin (MEVACOR) 40 MG tablet TAKE 1 TABLET EVERY DAY  ? metFORMIN (GLUCOPHAGE) 1000 MG tablet Take 1 tablet (1,000 mg total) by mouth daily.  ? Omega-3 1000 MG CAPS Take 1,000 mg by mouth daily.  ? omeprazole (PRILOSEC) 20 MG capsule TAKE 1 CAPSULE EVERY DAY  ? spironolactone (ALDACTONE) 25 MG tablet Take 2 tablets (50 mg total) by mouth daily.  ? traZODone (DESYREL) 100 MG tablet Take 0.5-1 tablets (50-100 mg total) by mouth at bedtime as needed for sleep.  ? TRUE METRIX BLOOD GLUCOSE TEST test strip TEST AS DIRECTED  ? TRUEplus Lancets 33G MISC TEST BLOOD SUGAR EVERY DAY  ? valsartan-hydrochlorothiazide (DIOVAN-HCT) 320-25 MG tablet Take 1 tablet by mouth daily.  ? vitamin E 180 MG (400 UNITS) capsule Take 1 capsule by mouth daily.  ? ?No facility-administered medications prior to visit.  ?  ?Allergies  ?Allergen Reactions  ? Celecoxib Rash and Other (See Comments)  ?  Flushed red  ? ? ?Patient Care Team: ?Birdie Sons, Bray as PCP - General (Family Medicine) ?Dasher, Rayvon Char, Bray (Dermatology) ?Anell Barr, OD (Optometry) ?Hyatt, Max T, DPM as Veterinary surgeon) ?Poggi, Marshall Cork, Bray as Consulting Physician (Orthopedic Surgery) ? ?Review of Systems  ?Constitutional:  Negative for chills, fatigue and fever.  ?HENT:  Negative for congestion, ear pain, rhinorrhea, sneezing and sore throat.   ?Eyes: Negative.  Negative for pain and redness.  ?Respiratory:  Negative for cough, shortness of breath and wheezing.   ?Cardiovascular:  Negative for chest pain and leg swelling.  ?Gastrointestinal:  Negative for abdominal pain, blood in stool, constipation, diarrhea and nausea.  ?Endocrine: Negative for polydipsia and polyphagia.  ?Genitourinary: Negative.  Negative for dysuria,  flank pain, hematuria, pelvic pain, vaginal bleeding and vaginal discharge.  ?Musculoskeletal:  Negative for arthralgias, back pain, gait problem and joint swelling.  ?Skin:  Negative for rash.  ?Neurological: Negative.  Negative for dizziness, tremors, seizures, weakness, light-headedness, numbness and headaches.  ?Hematological:  Negative for adenopathy.  ?Psychiatric/Behavioral: Negative.  Negative for behavioral problems, confusion and dysphoric mood. The patient is not nervous/anxious and is not hyperactive.   ? ? ?  ? Objective  ?  ?Vitals: BP (!) 137/49 (BP Location: Left Arm, Patient Position: Sitting, Cuff Size: Large)   Pulse 65   Temp 97.6 ?F (36.4 ?C) (Oral)   Resp 14   Ht 5' 2.01" (1.575 m)   Wt 162 lb (73.5 kg)   SpO2 100% Comment: room air  BMI 29.62 kg/m?  ? ? ? ?Physical Exam ? ?General Appearance:     ?Overweight female. Alert, cooperative, in no acute distress, appears stated age   ?Head:    Normocephalic, without obvious  abnormality, atraumatic  ?Eyes:    PERRL, conjunctiva/corneas clear, EOM's intact, fundi  ?  benign, both eyes  ?Ears:    Normal TM's and external ear canals, both ears  ?Nose:   Nares normal, septum midline, mucosa normal, no drainage  ?  or sinus tenderness  ?Throat:   Lips, mucosa, and tongue normal; teeth and gums normal  ?Neck:   Supple, symmetrical, trachea midline, no adenopathy;  ?  thyroid:  no enlargement/tenderness/nodules; no carotid ?  bruit or JVD  ?Back:     Symmetric, no curvature, ROM normal, no CVA tenderness  ?Lungs:     Clear to auscultation bilaterally, respirations unlabored  ?Chest Wall:    No tenderness or deformity  ? Heart:    Normal heart rate. Normal rhythm. No murmurs, rubs, or gallops.   ?Breast Exam:    deferred  ?Abdomen:     Soft, non-tender, bowel sounds active all four quadrants,  ?  no masses, no organomegaly  ?Pelvic:    deferred and not indicated; post-menopausal, no abnormal Pap smears in past  ?Extremities:   All extremities are  intact. No cyanosis or edema  ?Pulses:   2+ and symmetric all extremities  ?Skin:   Skin color, texture, turgor normal, no rashes or lesions  ?Lymph nodes:   Cervical, supraclavicular, and axillary nodes normal  ?

## 2021-06-06 ENCOUNTER — Telehealth: Payer: Self-pay

## 2021-06-06 LAB — VITAMIN D 25 HYDROXY (VIT D DEFICIENCY, FRACTURES): Vit D, 25-Hydroxy: 37.5 ng/mL (ref 30.0–100.0)

## 2021-06-06 LAB — RENAL FUNCTION PANEL
Albumin: 4.6 g/dL (ref 3.6–4.6)
BUN/Creatinine Ratio: 16 (ref 12–28)
BUN: 15 mg/dL (ref 8–27)
CO2: 21 mmol/L (ref 20–29)
Calcium: 9.9 mg/dL (ref 8.7–10.3)
Chloride: 92 mmol/L — ABNORMAL LOW (ref 96–106)
Creatinine, Ser: 0.92 mg/dL (ref 0.57–1.00)
Glucose: 161 mg/dL — ABNORMAL HIGH (ref 70–99)
Phosphorus: 3.2 mg/dL (ref 3.0–4.3)
Potassium: 4.6 mmol/L (ref 3.5–5.2)
Sodium: 127 mmol/L — ABNORMAL LOW (ref 134–144)
eGFR: 61 mL/min/{1.73_m2} (ref >59)

## 2021-06-06 LAB — LIPID PANEL
Chol/HDL Ratio: 3.3 ratio (ref 0.0–4.4)
Cholesterol, Total: 174 mg/dL (ref 100–199)
HDL: 53 mg/dL (ref >39)
LDL Chol Calc (NIH): 69 mg/dL (ref 0–99)
Triglycerides: 327 mg/dL — ABNORMAL HIGH (ref 0–149)
VLDL Cholesterol Cal: 52 mg/dL — ABNORMAL HIGH (ref 5–40)

## 2021-06-06 LAB — MICROALBUMIN / CREATININE URINE RATIO
Creatinine, Urine: 36 mg/dL
Microalb/Creat Ratio: 53 mg/g creat — ABNORMAL HIGH (ref 0–29)
Microalbumin, Urine: 19.2 ug/mL

## 2021-06-06 LAB — TSH: TSH: 2.39 u[IU]/mL (ref 0.450–4.500)

## 2021-06-06 LAB — HEMOGLOBIN A1C
Est. average glucose Bld gHb Est-mCnc: 143 mg/dL
Hgb A1c MFr Bld: 6.6 % — ABNORMAL HIGH (ref 4.8–5.6)

## 2021-06-06 NOTE — Telephone Encounter (Signed)
Patient called to get lab results. Results given and pt verbalized understanding. ?

## 2021-06-07 ENCOUNTER — Telehealth: Payer: Self-pay

## 2021-06-07 ENCOUNTER — Other Ambulatory Visit: Payer: Self-pay | Admitting: Family Medicine

## 2021-06-07 NOTE — Telephone Encounter (Signed)
Copied from [place] [id number]. Topic: Referral - Request for Referral >> Jun 07, 2021 11:50 AM Alanda Slim E wrote: Has patient seen PCP for this complaint? Yes  *If NO, is insurance requiring patient see PCP for this issue before PCP can refer them? Referral for which specialty: Bone density test  Preferred provider/office: [facility] / pt has appt on 7.12.23 Reason for referral: [employer] states she needs a referral for the test to be covered / please advise when sent

## 2021-06-12 NOTE — Telephone Encounter (Signed)
Please advise. Patient called stating that her insurance requires a referral in order for them to pay for a bone density test. Our office already placed an order for a bone density test on 06/05/2021, and the appointment has been scheduled for 08/01/2021. Is there anything else we need to do or submit to insurance?

## 2021-06-12 NOTE — Telephone Encounter (Signed)
Pt returned call, states that per her [facility] insurance she needs a referral from her PCP or else she will have to pay for this. Please advise   Pt wants a call back from the nurse to discuss

## 2021-06-20 ENCOUNTER — Other Ambulatory Visit: Payer: Self-pay | Admitting: Family Medicine

## 2021-07-16 DIAGNOSIS — D0461 Carcinoma in situ of skin of right upper limb, including shoulder: Secondary | ICD-10-CM | POA: Diagnosis not present

## 2021-07-30 ENCOUNTER — Other Ambulatory Visit: Payer: Self-pay | Admitting: Family Medicine

## 2021-08-01 ENCOUNTER — Ambulatory Visit
Admission: RE | Admit: 2021-08-01 | Discharge: 2021-08-01 | Disposition: A | Discharge status: Home / Self Care | Payer: Medicare HMO | Source: Ambulatory Visit | Attending: Family Medicine | Admitting: Family Medicine

## 2021-08-01 DIAGNOSIS — M810 Age-related osteoporosis without current pathological fracture: Secondary | ICD-10-CM | POA: Insufficient documentation

## 2021-08-01 DIAGNOSIS — M85852 Other specified disorders of bone density and structure, left thigh: Secondary | ICD-10-CM | POA: Diagnosis not present

## 2021-08-06 DIAGNOSIS — D044 Carcinoma in situ of skin of scalp and neck: Secondary | ICD-10-CM | POA: Diagnosis not present

## 2021-08-28 ENCOUNTER — Other Ambulatory Visit: Payer: Self-pay | Admitting: Family Medicine

## 2021-08-28 DIAGNOSIS — F439 Reaction to severe stress, unspecified: Secondary | ICD-10-CM

## 2021-08-28 DIAGNOSIS — F5104 Psychophysiologic insomnia: Secondary | ICD-10-CM

## 2021-09-04 DIAGNOSIS — E119 Type 2 diabetes mellitus without complications: Secondary | ICD-10-CM | POA: Diagnosis not present

## 2021-09-04 DIAGNOSIS — Z0100 Encounter for examination of eyes and vision without abnormal findings: Secondary | ICD-10-CM | POA: Diagnosis not present

## 2021-10-04 ENCOUNTER — Other Ambulatory Visit: Payer: Self-pay | Admitting: Family Medicine

## 2021-10-08 ENCOUNTER — Ambulatory Visit (INDEPENDENT_AMBULATORY_CARE_PROVIDER_SITE_OTHER): Payer: Medicare HMO | Admitting: Family Medicine

## 2021-10-08 ENCOUNTER — Encounter: Payer: Self-pay | Admitting: Family Medicine

## 2021-10-08 VITALS — BP 130/60 | HR 99 | Temp 97.8°F | Resp 14 | Wt 163.0 lb

## 2021-10-08 DIAGNOSIS — I10 Essential (primary) hypertension: Secondary | ICD-10-CM

## 2021-10-08 DIAGNOSIS — E871 Hypo-osmolality and hyponatremia: Secondary | ICD-10-CM | POA: Diagnosis not present

## 2021-10-08 DIAGNOSIS — Z23 Encounter for immunization: Secondary | ICD-10-CM | POA: Diagnosis not present

## 2021-10-08 DIAGNOSIS — E1122 Type 2 diabetes mellitus with diabetic chronic kidney disease: Secondary | ICD-10-CM

## 2021-10-08 DIAGNOSIS — N181 Chronic kidney disease, stage 1: Secondary | ICD-10-CM

## 2021-10-08 NOTE — Progress Notes (Signed)
I,Roshena L Chambers,acting as a scribe for Lelon Huh, MD.,have documented all relevant documentation on the behalf of Lelon Huh, MD,as directed by  Lelon Huh, MD while in the presence of Lelon Huh, MD.   Established patient visit   Patient: Natalie Bray   DOB: 1934/05/24   87 y.o. Female  MRN: [id number] Visit Date: 10/08/2021  Today's healthcare provider: Lelon Huh, MD   Chief Complaint  Patient presents with   Diabetes   Hypertension   Subjective    HPI  Diabetes Mellitus Type II, Follow-up  Lab Results  Component Value Date   HGBA1C 6.6 (H) 06/05/2021   HGBA1C 6.7 (H) 02/02/2021   HGBA1C 6.8 (A) 10/24/2020   Wt Readings from Last 3 Encounters:  10/08/21 163 lb (73.9 kg)  06/05/21 162 lb (73.5 kg)  02/21/21 154 lb 15.7 oz (70.3 kg)   Last seen for diabetes 4 months ago.  Management since then includes continuing same medication. She reports good compliance with treatment. She is not having side effects.  Symptoms: No fatigue No foot ulcerations  No appetite changes No nausea  No paresthesia of the feet  No polydipsia  No polyuria No visual disturbances   No vomiting     Home blood sugar records: fasting range: 80-180  Episodes of hypoglycemia? No    Current insulin regiment: none Most Recent Eye Exam: <1 year ago Current exercise: none Current diet habits: well balanced  Pertinent Labs: Lab Results  Component Value Date   CHOL 174 06/05/2021   HDL 53 06/05/2021   LDLCALC 69 06/05/2021   TRIG 327 (H) 06/05/2021   CHOLHDL 3.3 06/05/2021   Lab Results  Component Value Date   NA 127 (L) 06/05/2021   K 4.6 06/05/2021   CREATININE 0.92 06/05/2021   EGFR 61 06/05/2021   MICROALBUR 100 02/04/2018   LABMICR 19.2 06/05/2021     ---------------------------------------------------------------------------------------------------  Hypertension, follow-up  BP Readings from Last 3 Encounters:  10/08/21 (!) 147/52  06/05/21 (!)  137/49  02/21/21 (!) 145/63   Wt Readings from Last 3 Encounters:  10/08/21 163 lb (73.9 kg)  06/05/21 162 lb (73.5 kg)  02/21/21 154 lb 15.7 oz (70.3 kg)     She was last seen for hypertension 4 months ago.  BP at that visit was 137/49. Management since that visit includes changing from 2 x 16m spironolactone daily to 1 x 543mdaily.  She reports good compliance with treatment. She is not having side effects.  She is following a Regular diet. She is not exercising. She does not smoke.  Use of agents associated with hypertension: NSAIDS.   Outside blood pressures are checked occasionally. Symptoms: No chest pain No chest pressure  No palpitations No syncope  No dyspnea No orthopnea  No paroxysmal nocturnal dyspnea No lower extremity edema    ---------------------------------------------------------------------------------------------------   Medications: Outpatient Medications Prior to Visit  Medication Sig   acetaminophen (TYLENOL) 500 MG tablet Take 1,000 mg by mouth every 6 (six) hours as needed for moderate pain.   amLODipine (NORVASC) 10 MG tablet TAKE 1 TABLET EVERY DAY   aspirin 81 MG chewable tablet Chew 81 mg by mouth daily.   calcium-vitamin D (OSCAL WITH D) 500-200 MG-UNIT tablet Take 1 tablet by mouth daily.   Cholecalciferol (VITAMIN D3) 50 MCG (2000 UT) TABS Take 1 tablet by mouth daily.   cloNIDine (CATAPRES) 0.1 MG tablet TAKE 1 TABLET AS NEEDED FOR BLOOD PRESSURE OVER 180. MAY REPEAT IN  30 MINUTES. DO NOT TAKE MORE THAN 3 IN A DAY   docusate sodium (COLACE) 100 MG capsule Take 100 mg by mouth daily.   FLUoxetine (PROZAC) 10 MG capsule TAKE 1 CAPSULE EVERY DAY   glipiZIDE (GLUCOTROL XL) 5 MG 24 hr tablet TAKE 1 TABLET EVERY DAY WITH BREAKFAST   lovastatin (MEVACOR) 40 MG tablet TAKE 1 TABLET EVERY DAY   metFORMIN (GLUCOPHAGE) 1000 MG tablet TAKE 1 TABLET TWICE DAILY   Omega-3 1000 MG CAPS Take 1,000 mg by mouth daily.   omeprazole (PRILOSEC) 20 MG capsule  TAKE 1 CAPSULE EVERY DAY   spironolactone (ALDACTONE) 50 MG tablet Take 1 tablet (50 mg total) by mouth daily.   traZODone (DESYREL) 100 MG tablet Take 0.5-1 tablets (50-100 mg total) by mouth at bedtime as needed for sleep.   TRUE METRIX BLOOD GLUCOSE TEST test strip TEST AS DIRECTED   TRUEplus Lancets 33G MISC TEST BLOOD SUGAR EVERY DAY   valsartan-hydrochlorothiazide (DIOVAN-HCT) 320-25 MG tablet Take 1 tablet by mouth daily.   vitamin E 180 MG (400 UNITS) capsule Take 1 capsule by mouth daily.   No facility-administered medications prior to visit.    Review of Systems  Constitutional:  Negative for appetite change, chills, fatigue and fever.  Respiratory:  Negative for chest tightness and shortness of breath.   Cardiovascular:  Negative for chest pain and palpitations.  Gastrointestinal:  Negative for abdominal pain, nausea and vomiting.  Neurological:  Negative for dizziness and weakness.       Objective    BP (!) 147/52 (BP Location: Right Arm, Patient Position: Sitting, Cuff Size: Large)   Pulse 99   Temp 97.8 F (36.6 C) (Oral)   Resp 14   Wt 163 lb (73.9 kg)   SpO2 98% Comment: room air  BMI 29.81 kg/m   Today's Vitals   10/08/21 0931 10/08/21 0935 10/08/21 1001  BP: (!) 144/52 (!) 147/52 130/60  Pulse: 99    Resp: 14    Temp: 97.8 F (36.6 C)    TempSrc: Oral    SpO2: 98%    Weight: 163 lb (73.9 kg)     Body mass index is 29.81 kg/m.    Physical Exam   General appearance: Well developed, well nourished female, cooperative and in no acute distress Head: Normocephalic, without obvious abnormality, atraumatic Respiratory: Respirations even and unlabored, normal respiratory rate Extremities: All extremities are intact.  Skin: Skin color, texture, turgor normal. No rashes seen  Psych: Appropriate mood and affect. Neurologic: Mental status: Alert, oriented to person, place, and time, thought content appropriate.    Assessment & Plan     1. Primary  hypertension Fairly well controlled. Diuretic dose limited by hyponatremia.  - Renal function panel  2. Type 2 diabetes mellitus with stage 1 chronic kidney disease, without long-term current use of insulin (HCC) Doing well current medications.  - Hemoglobin A1c  3. Hyponatremia Secondary to diuretics required for blood pressure control.  - Renal function panel  4. Need for influenza vaccination  - Flu Vaccine QUAD High Dose IM (Fluad)   Diabetes follow up in 4 months.      The entirety of the information documented in the History of Present Illness, Review of Systems and Physical Exam were personally obtained by me. Portions of this information were initially documented by the CMA and reviewed by me for thoroughness and accuracy.     Lelon Huh, MD  [facility] [phone] (phone) [phone] (fax)  [facility]

## 2021-10-09 LAB — RENAL FUNCTION PANEL
Albumin: 4.6 g/dL (ref 3.7–4.7)
BUN/Creatinine Ratio: 23 (ref 12–28)
BUN: 23 mg/dL (ref 8–27)
CO2: 23 mmol/L (ref 20–29)
Calcium: 10.8 mg/dL — ABNORMAL HIGH (ref 8.7–10.3)
Chloride: 97 mmol/L (ref 96–106)
Creatinine, Ser: 0.98 mg/dL (ref 0.57–1.00)
Glucose: 116 mg/dL — ABNORMAL HIGH (ref 70–99)
Phosphorus: 3.4 mg/dL (ref 3.0–4.3)
Potassium: 4.7 mmol/L (ref 3.5–5.2)
Sodium: 135 mmol/L (ref 134–144)
eGFR: 57 mL/min/{1.73_m2} — ABNORMAL LOW (ref >59)

## 2021-10-09 LAB — HEMOGLOBIN A1C
Est. average glucose Bld gHb Est-mCnc: 148 mg/dL
Hgb A1c MFr Bld: 6.8 % — ABNORMAL HIGH (ref 4.8–5.6)

## 2021-10-23 ENCOUNTER — Ambulatory Visit: Payer: Self-pay

## 2021-10-23 ENCOUNTER — Ambulatory Visit (INDEPENDENT_AMBULATORY_CARE_PROVIDER_SITE_OTHER): Payer: Medicare HMO | Admitting: Family Medicine

## 2021-10-23 VITALS — BP 119/53 | HR 85 | Temp 98.1°F | Wt 164.0 lb

## 2021-10-23 DIAGNOSIS — I10 Essential (primary) hypertension: Secondary | ICD-10-CM

## 2021-10-23 DIAGNOSIS — R110 Nausea: Secondary | ICD-10-CM

## 2021-10-23 DIAGNOSIS — R5383 Other fatigue: Secondary | ICD-10-CM | POA: Diagnosis not present

## 2021-10-23 NOTE — Progress Notes (Signed)
Ar[place]ina Ponder De[place]anto,acting as a scribe for Lelon Huh, MD.,have documented all relevant documentation on the behalf of Lelon Huh, MD,as directed by  Lelon Huh, MD while in the presence of Lelon Huh, MD.     Established patient visit   Patient: Natalie Bray   DOB: February 02, 1934   87 y.o. Female  MRN: [id number] Visit Date: 10/23/2021  Today's healthcare provider: Lelon Huh, MD   No chief complaint on file.  Subjective    HPI   Hypertension, with recent elevated reading at home  BP Readings from Last 3 Encounters:  10/23/21 (!) 119/53  10/08/21 130/60  06/05/21 (!) 137/49   Wt Readings from Last 3 Encounters:  10/23/21 164 lb (74.4 kg)  10/08/21 163 lb (73.9 kg)  06/05/21 162 lb (73.5 kg)     She was last seen for hypertension 2 weeks ago.  BP at that visit was as above. Management since that visit includes no changes.  She reports good compliance with treatment. She is not having side effects.  She is following a Regular diet. She is not exercising. She does not smoke.  Use of agents associated with hypertension: NSAIDS.   Last night patient had her daughter checking her blood pressure several times.  They got the following readings; 175/70, 177/76, and 185/76.  They called EMS but patient did not go to the hospital.  When EMS checked her blood pressure they got 185/92 and her blood glucose was 232.  Readings were taken about 9 pm which were about 2 hours post prandial.  By the time EMS was ready to leave they got a blood pressure reading with a systolic in the 197' but the patient is unsure of the diastolic. Today when the patient called for her appointment she spoke with the triage nurse and at that time her reading was 195/91 with heart rate of 100. Patient had taken 2 Tylenol last night after checking her blood pressure and was afraid to take a Clonidine with them.  Today after getting the reading of 195/91 she did take 1 Clonidine.  This was at about 9  am today. Patient reports feeling shakey today with some nausea.     Symptoms: No chest pain No chest pressure  No palpitations No syncope  No dyspnea No orthopnea  No paroxysmal nocturnal dyspnea No lower extremity edema   Pertinent labs Lab Results  Component Value Date   CHOL 174 06/05/2021   HDL 53 06/05/2021   LDLCALC 69 06/05/2021   TRIG 327 (H) 06/05/2021   CHOLHDL 3.3 06/05/2021   Lab Results  Component Value Date   NA 135 10/08/2021   K 4.7 10/08/2021   CREATININE 0.98 10/08/2021   EGFR 57 (L) 10/08/2021   GLUCOSE 116 (H) 10/08/2021   TSH 2.390 06/05/2021     The ASCVD Risk score (Arnett DK, et al., 2019) failed to calculate for the following reasons:   The 2019 ASCVD risk score is only valid for ages 65 to 61  ---------------------------------------------------------------------------------------------------  Medications: Outpatient Medications Prior to Visit  Medication Sig   acetaminophen (TYLENOL) 500 MG tablet Take 1,000 mg by mouth every 6 (six) hours as needed for moderate pain.   amLODipine (NORVASC) 10 MG tablet TAKE 1 TABLET EVERY DAY   aspirin 81 MG chewable tablet Chew 81 mg by mouth daily.   calcium-vitamin D (OSCAL WITH D) 500-200 MG-UNIT tablet Take 1 tablet by mouth daily.   Cholecalciferol (VITAMIN D3) 50 MCG (2000 UT) TABS Take  1 tablet by mouth daily.   cloNIDine (CATAPRES) 0.1 MG tablet TAKE 1 TABLET AS NEEDED FOR BLOOD PRESSURE OVER 180. MAY REPEAT IN 30 MINUTES. DO NOT TAKE MORE THAN 3 IN A DAY   docusate sodium (COLACE) 100 MG capsule Take 100 mg by mouth daily.   FLUoxetine (PROZAC) 10 MG capsule TAKE 1 CAPSULE EVERY DAY   glipiZIDE (GLUCOTROL XL) 5 MG 24 hr tablet TAKE 1 TABLET EVERY DAY WITH BREAKFAST   lovastatin (MEVACOR) 40 MG tablet TAKE 1 TABLET EVERY DAY   metFORMIN (GLUCOPHAGE) 1000 MG tablet TAKE 1 TABLET TWICE DAILY   Omega-3 1000 MG CAPS Take 1,000 mg by mouth daily.   omeprazole (PRILOSEC) 20 MG capsule TAKE 1 CAPSULE EVERY  DAY   spironolactone (ALDACTONE) 50 MG tablet Take 1 tablet (50 mg total) by mouth daily.   traZODone (DESYREL) 100 MG tablet Take 0.5-1 tablets (50-100 mg total) by mouth at bedtime as needed for sleep.   TRUE METRIX BLOOD GLUCOSE TEST test strip TEST AS DIRECTED   TRUEplus Lancets 33G MISC TEST BLOOD SUGAR EVERY DAY   valsartan-hydrochlorothiazide (DIOVAN-HCT) 320-25 MG tablet Take 1 tablet by mouth daily.   vitamin E 180 MG (400 UNITS) capsule Take 1 capsule by mouth daily.   No facility-administered medications prior to visit.    Review of Systems  Constitutional:  Negative for appetite change, chills, fatigue and fever.  Respiratory:  Negative for chest tightness and shortness of breath.   Cardiovascular:  Negative for chest pain and palpitations.  Gastrointestinal:  Negative for abdominal pain, nausea and vomiting.  Neurological:  Negative for dizziness and weakness.        Objective    BP (!) 119/53 (BP Location: Right Arm, Patient Position: Sitting, Cuff Size: Normal)   Pulse 85   Temp 98.1 F (36.7 C) (Oral)   Wt 164 lb (74.4 kg)   SpO2 100%   BMI 29.99 kg/m   Vitals:   10/23/21 1014 10/23/21 1022  BP: (!) 148/57 (!) 119/53  Pulse: 85   Temp: 98.1 F (36.7 C)   TempSrc: Oral   SpO2: 100%   Weight: 164 lb (74.4 kg)     Physical Exam   General: Appearance:    Well developed, well nourished female in no acute distress  Eyes:    PERRL, conjunctiva/corneas clear, EOM's intact       Lungs:     Clear to auscultation bilaterally, respirations unlabored  Heart:    Normal heart rate. Normal rhythm. No murmurs, rubs, or gallops.    MS:   All extremities are intact.    Neurologic:   Awake, alert, oriented x 3. No apparent focal neurological defect.          Assessment & Plan     1. Primary hypertension Labile blood pressure over the last day. Much better in office, but she did take one clonidine this morning. She does report sick feeling in her stomach since  yesterday evening.  - Magnesium  2. Other fatigue  - CBC - Comprehensive metabolic panel - TSH + free T4  3. Nausea   This started after finding that her blood pressure was very high. Suspect this is related to stress.      The entirety of the information documented in the History of Present Illness, Review of Systems and Physical Exam were personally obtained by me. Portions of this information were initially documented by the CMA and reviewed by me for thoroughness and accuracy.  Lelon Huh, MD  [facility] [phone] (phone) [phone] (fax)  [place]

## 2021-10-23 NOTE — Telephone Encounter (Signed)
  Chief Complaint: High BP readings - anxiety Symptoms: High BP readings Frequency: last night Pertinent Negatives: Patient denies Chest pain , sob Disposition: '[]'$ ED /'[]'$ Urgent Care (no appt availability in office) / '[x]'$ Appointment(In office/virtual)/ '[]'$  [place] [facility]/ '[]'$ Home Care/ '[]'$ Refused Recommended Disposition /'[]'$ [place] [facility]/ '[]'$  Follow-up with PCP Additional Notes: PT states that last night her daughter took her blood pressure several times and found it to be elevated. PT called EMS. Pt was checked by EMS and not taken to hospital. Pt states that she does have anxiety. Pt sounds anxious today. Pt is also fel nauseous.  Pt took Tylenol last night. Pt did not take clonidine, as she was afraid that the tylenol and clonidine together would lower her BP too much. Pt wonders if she could have taken a second anxiety pill last night.   Reason for Disposition  [1] Systolic BP  >= 497 OR Diastolic >= 026 AND [3] having NO cardiac or neurologic symptoms  Answer Assessment - Initial Assessment Questions 1. BLOOD PRESSURE: "What is the blood pressure?" "Did you take at least two measurements 5 minutes apart?"     175/70, 177/76,185/76,all last night, during phone call 195/91 HR 100 2. ONSET: "When did you take your blood pressure?"     Last night and just now 3. HOW: "How did you take your blood pressure?" (e.g., automatic home BP monitor, visiting nurse)     automatic 4. HISTORY: "Do you have a history of high blood pressure?"     yes 5. MEDICINES: "Are you taking any medicines for blood pressure?" "Have you missed any doses recently?"     Yes, no 6. OTHER SYMPTOMS: "Do you have any symptoms?" (e.g., blurred vision, chest pain, difficulty breathing, headache, weakness)     nausea 7. PREGNANCY: "Is there any chance you are pregnant?" "When was your last menstrual period?"  Protocols used: Blood Pressure - High-A-AH

## 2021-10-24 ENCOUNTER — Other Ambulatory Visit: Payer: Self-pay | Admitting: Family Medicine

## 2021-10-24 LAB — CBC
Hematocrit: 40 % (ref 34.0–46.6)
Hemoglobin: 13.3 g/dL (ref 11.1–15.9)
MCH: 29.6 pg (ref 26.6–33.0)
MCHC: 33.3 g/dL (ref 31.5–35.7)
MCV: 89 fL (ref 79–97)
Platelets: 363 10*3/uL (ref 150–450)
RBC: 4.5 x10E6/uL (ref 3.77–5.28)
RDW: 13.1 % (ref 11.7–15.4)
WBC: 6.5 10*3/uL (ref 3.4–10.8)

## 2021-10-24 LAB — COMPREHENSIVE METABOLIC PANEL
ALT: 20 IU/L (ref 0–32)
AST: 20 IU/L (ref 0–40)
Albumin/Globulin Ratio: 1.8 (ref 1.2–2.2)
Albumin: 4.6 g/dL (ref 3.7–4.7)
Alkaline Phosphatase: 89 IU/L (ref 44–121)
BUN/Creatinine Ratio: 16 (ref 12–28)
BUN: 16 mg/dL (ref 8–27)
Bilirubin Total: 0.2 mg/dL (ref 0.0–1.2)
CO2: 23 mmol/L (ref 20–29)
Calcium: 10.3 mg/dL (ref 8.7–10.3)
Chloride: 89 mmol/L — ABNORMAL LOW (ref 96–106)
Creatinine, Ser: 0.99 mg/dL (ref 0.57–1.00)
Globulin, Total: 2.6 g/dL (ref 1.5–4.5)
Glucose: 179 mg/dL — ABNORMAL HIGH (ref 70–99)
Potassium: 4.4 mmol/L (ref 3.5–5.2)
Sodium: 131 mmol/L — ABNORMAL LOW (ref 134–144)
Total Protein: 7.2 g/dL (ref 6.0–8.5)
eGFR: 56 mL/min/{1.73_m2} — ABNORMAL LOW (ref >59)

## 2021-10-24 LAB — MAGNESIUM: Magnesium: 2 mg/dL (ref 1.6–2.3)

## 2021-10-24 LAB — TSH+FREE T4
Free T4: 1.58 ng/dL (ref 0.82–1.77)
TSH: 2.17 u[IU]/mL (ref 0.450–4.500)

## 2021-10-24 NOTE — Telephone Encounter (Signed)
Requested Prescriptions  Pending Prescriptions Disp Refills  . valsartan-hydrochlorothiazide (DIOVAN-HCT) 320-25 MG tablet [Pharmacy Med Name: VALSARTAN/HYDROCHLOROTHIAZIDE 320-25 MG Tablet] 90 tablet 1    Sig: TAKE 1 TABLET EVERY DAY     Cardiovascular: ARB + Diuretic Combos Failed - 10/24/2021  3:42 PM      Failed - Na in normal range and within 180 days    Sodium  Date Value Ref Range Status  10/23/2021 131 (L) 134 - 144 mmol/L Final  07/23/2011 145 136 - 145 mmol/L Final         Passed - K in normal range and within 180 days    Potassium  Date Value Ref Range Status  10/23/2021 4.4 3.5 - 5.2 mmol/L Final  07/23/2011 3.3 (L) 3.5 - 5.1 mmol/L Final         Passed - Cr in normal range and within 180 days    Creatinine  Date Value Ref Range Status  07/23/2011 0.59 (L) 0.60 - 1.30 mg/dL Final   Creatinine, Ser  Date Value Ref Range Status  10/23/2021 0.99 0.57 - 1.00 mg/dL Final   Creatinine, POC  Date Value Ref Range Status  09/11/2015 n/a mg/dL Final         Passed - eGFR is 10 or above and within 180 days    EGFR (African American)  Date Value Ref Range Status  07/23/2011 >60  Final   GFR calc Af Amer  Date Value Ref Range Status  05/28/2019 69 >59 mL/min/1.73 Final    Comment:    **[facility] currently reports eGFR in compliance with the current**   recommendations of the [employer]. [facility] will   update reporting as new guidelines are published from the NKF-ASN   Task force.    EGFR (Non-African Amer.)  Date Value Ref Range Status  07/23/2011 >60  Final    Comment:    eGFR values <19m/min/1.73 m2 may be an indication of chronic kidney disease (CKD). Calculated eGFR is useful in patients with stable renal function. The eGFR calculation will not be reliable in acutely ill patients when serum creatinine is changing rapidly. It is not useful in  patients on dialysis. The eGFR calculation may not be applicable to patients at the low and high  extremes of body sizes, pregnant women, and vegetarians.    GFR calc non Af Amer  Date Value Ref Range Status  05/28/2019 60 >59 mL/min/1.73 Final   eGFR  Date Value Ref Range Status  10/23/2021 56 (L) >59 mL/min/1.73 Final         Passed - Patient is not pregnant      Passed - Last BP in normal range    BP Readings from Last 1 Encounters:  10/23/21 (!) 119/53         Passed - Valid encounter within last 6 months    Recent Outpatient Visits          [facility]   2 weeks [facility]   4 months ago Annual physical exam   [facility]   8 months ago Type 2 diabetes mellitus with stage 1 chronic kidney disease, without long-term current use of insulin ([facility]   [facility]   10 months ago Acute bronchitis, unspecified organism   [facility]  Future Appointments            In 3 months Fisher, Kirstie Peri, MD [facility] [place]

## 2021-10-29 ENCOUNTER — Ambulatory Visit: Payer: Medicare HMO | Admitting: Podiatry

## 2021-10-29 ENCOUNTER — Encounter: Payer: Self-pay | Admitting: Podiatry

## 2021-10-29 DIAGNOSIS — L600 Ingrowing nail: Secondary | ICD-10-CM

## 2021-10-29 MED ORDER — NEOMYCIN-POLYMYXIN-HC 1 % OT SOLN: OTIC | 1 refills | Status: DC

## 2021-10-29 NOTE — Progress Notes (Signed)
She presents today for follow-up of her matrixectomy hallux left tibial border.  The nail was removed several years ago and she states that this point she would like to go ahead and have this margin removed again.  Objective: Vital signs are stable alert oriented x3.  Pulses are palpable.  There is no erythema edema salines drainage odor small piece of nail spicule with a very small hole just proximal to it where she states that she saw pus.  Assessment: Byrnett remaining nail spicule painful tibial border hallux left.  Assessment: Chemical matricectomy was performed today after local anesthetic was administered.  Tolerated procedure well without complications follow-up with me in the near future for postop visit.

## 2021-10-29 NOTE — Patient Instructions (Signed)

## 2021-10-30 ENCOUNTER — Telehealth: Payer: Self-pay | Admitting: *Deleted

## 2021-10-30 NOTE — Telephone Encounter (Signed)
Patient is calling to ask how many drops of the neomycin-polymyxin) to use on toe after soaking .  Instructions given,verbalized understanding.

## 2021-11-26 ENCOUNTER — Ambulatory Visit: Payer: Medicare HMO | Admitting: Podiatry

## 2021-11-26 ENCOUNTER — Encounter: Payer: Self-pay | Admitting: Podiatry

## 2021-11-26 DIAGNOSIS — Z9889 Other specified postprocedural states: Secondary | ICD-10-CM

## 2021-11-26 DIAGNOSIS — L600 Ingrowing nail: Secondary | ICD-10-CM

## 2021-11-26 MED ORDER — DOXYCYCLINE HYCLATE 100 MG PO TABS: 100.0000 mg | ORAL_TABLET | Freq: Two times a day (BID) | ORAL | 0 refills | Status: DC

## 2021-11-26 NOTE — Progress Notes (Signed)
She presents today for follow-up of her matrixectomy hallux left tibial border.  She states allergic to the Betadine is started to break out and started to use Epsom salts and that this made pus up more so just like to have an antibiotic at all possible.  Objective: Vital signs stable tellurian x3 she has no erythema edema cellulitis drainage or odor she is got some fibrin deposition along that tibial border of the left hallux appears to be healing fine though there is no purulence on palpation.  Assessment: Paronychia status post matrixectomy.  Plan: Martin Majestic ahead and prescribed her doxycycline and she will follow-up with me on an as-needed basis.

## 2021-12-06 DIAGNOSIS — L570 Actinic keratosis: Secondary | ICD-10-CM | POA: Diagnosis not present

## 2021-12-06 DIAGNOSIS — D2261 Melanocytic nevi of right upper limb, including shoulder: Secondary | ICD-10-CM | POA: Diagnosis not present

## 2021-12-06 DIAGNOSIS — X32XXXA Exposure to sunlight, initial encounter: Secondary | ICD-10-CM | POA: Diagnosis not present

## 2021-12-06 DIAGNOSIS — D2262 Melanocytic nevi of left upper limb, including shoulder: Secondary | ICD-10-CM | POA: Diagnosis not present

## 2021-12-06 DIAGNOSIS — D2272 Melanocytic nevi of left lower limb, including hip: Secondary | ICD-10-CM | POA: Diagnosis not present

## 2021-12-06 DIAGNOSIS — R202 Paresthesia of skin: Secondary | ICD-10-CM | POA: Diagnosis not present

## 2021-12-06 DIAGNOSIS — Z85828 Personal history of other malignant neoplasm of skin: Secondary | ICD-10-CM | POA: Diagnosis not present

## 2021-12-07 DIAGNOSIS — I6523 Occlusion and stenosis of bilateral carotid arteries: Secondary | ICD-10-CM | POA: Diagnosis not present

## 2021-12-07 DIAGNOSIS — I10 Essential (primary) hypertension: Secondary | ICD-10-CM | POA: Diagnosis not present

## 2021-12-07 DIAGNOSIS — E782 Mixed hyperlipidemia: Secondary | ICD-10-CM | POA: Diagnosis not present

## 2022-01-18 ENCOUNTER — Other Ambulatory Visit: Payer: Self-pay | Admitting: Family Medicine

## 2022-01-18 DIAGNOSIS — I10 Essential (primary) hypertension: Secondary | ICD-10-CM

## 2022-02-11 ENCOUNTER — Encounter: Payer: Self-pay | Admitting: Family Medicine

## 2022-02-11 ENCOUNTER — Ambulatory Visit (INDEPENDENT_AMBULATORY_CARE_PROVIDER_SITE_OTHER): Payer: Medicare HMO | Admitting: Family Medicine

## 2022-02-11 VITALS — BP 145/61 | HR 75 | Temp 97.7°F | Ht 62.0 in | Wt 162.0 lb

## 2022-02-11 DIAGNOSIS — R0600 Dyspnea, unspecified: Secondary | ICD-10-CM

## 2022-02-11 DIAGNOSIS — I10 Essential (primary) hypertension: Secondary | ICD-10-CM | POA: Diagnosis not present

## 2022-02-11 DIAGNOSIS — N181 Chronic kidney disease, stage 1: Secondary | ICD-10-CM | POA: Diagnosis not present

## 2022-02-11 DIAGNOSIS — E871 Hypo-osmolality and hyponatremia: Secondary | ICD-10-CM | POA: Diagnosis not present

## 2022-02-11 DIAGNOSIS — E1122 Type 2 diabetes mellitus with diabetic chronic kidney disease: Secondary | ICD-10-CM

## 2022-02-11 DIAGNOSIS — R053 Chronic cough: Secondary | ICD-10-CM

## 2022-02-11 NOTE — Progress Notes (Signed)
Established patient visit   Patient: Natalie Bray   DOB: [birth year]   88 y.o. Female  MRN: [id number] Visit Date: 02/11/2022  Today's healthcare provider: Lelon Huh, MD   Chief Complaint  Patient presents with   Hypertension   Diabetes   Subjective    HPI Follow up for diabetes and hypertension. No new complains she does reports she had had persistent cough every since she had covid a few years ago. Cough feels like its coming for tickle in throat. She does get tired and short winded when walking. No fevers, chills, sweats, chest pains or palpations.  Lab Results  Component Value Date   HGBA1C 6.8 (H) 10/08/2021   HGBA1C 6.6 (H) 06/05/2021   HGBA1C 6.7 (H) 02/02/2021   Lab Results  Component Value Date   NA 131 (L) 10/23/2021   K 4.4 10/23/2021   CREATININE 0.99 10/23/2021   EGFR 56 (L) 10/23/2021   GFRNONAA 60 05/28/2019   GLUCOSE 179 (H) 10/23/2021     Medications: Outpatient Medications Prior to Visit  Medication Sig   acetaminophen (TYLENOL) 500 MG tablet Take 1,000 mg by mouth every 6 (six) hours as needed for moderate pain.   amLODipine (NORVASC) 10 MG tablet TAKE 1 TABLET EVERY DAY   aspirin 81 MG chewable tablet Chew 81 mg by mouth daily.   calcium-vitamin D (OSCAL WITH D) 500-200 MG-UNIT tablet Take 1 tablet by mouth daily.   Cholecalciferol (VITAMIN D3) 50 MCG (2000 UT) TABS Take 1 tablet by mouth daily.   cloNIDine (CATAPRES) 0.1 MG tablet TAKE 1 TABLET AS NEEDED FOR BLOOD PRESSURE OVER 180. MAY REPEAT IN 30 MINUTES. DO NOT TAKE MORE THAN 3 IN A DAY   docusate sodium (COLACE) 100 MG capsule Take 100 mg by mouth daily.   doxycycline (VIBRA-TABS) 100 MG tablet Take 1 tablet (100 mg total) by mouth 2 (two) times daily.   FLUoxetine (PROZAC) 10 MG capsule TAKE 1 CAPSULE EVERY DAY   glipiZIDE (GLUCOTROL XL) 5 MG 24 hr tablet TAKE 1 TABLET EVERY DAY WITH BREAKFAST   lovastatin (MEVACOR) 40 MG tablet TAKE 1 TABLET EVERY DAY   metFORMIN (GLUCOPHAGE)  1000 MG tablet TAKE 1 TABLET TWICE DAILY   NEOMYCIN-POLYMYXIN-HYDROCORTISONE (CORTISPORIN) 1 % SOLN OTIC solution Apply 1-2 drops to toe BID after soaking   Omega-3 1000 MG CAPS Take 1,000 mg by mouth daily.   omeprazole (PRILOSEC) 20 MG capsule TAKE 1 CAPSULE EVERY DAY   spironolactone (ALDACTONE) 50 MG tablet TAKE 1 TABLET EVERY DAY (DOSE CHANGE)   traZODone (DESYREL) 100 MG tablet Take 0.5-1 tablets (50-100 mg total) by mouth at bedtime as needed for sleep.   TRUE METRIX BLOOD GLUCOSE TEST test strip TEST AS DIRECTED   TRUEplus Lancets 33G MISC TEST BLOOD SUGAR EVERY DAY   valsartan-hydrochlorothiazide (DIOVAN-HCT) 320-25 MG tablet TAKE 1 TABLET EVERY DAY   vitamin E 180 MG (400 UNITS) capsule Take 1 capsule by mouth daily.   No facility-administered medications prior to visit.    Review of Systems  Constitutional:  Positive for fatigue. Negative for appetite change, chills and fever.  Respiratory:  Positive for cough and shortness of breath. Negative for chest tightness and wheezing.   Cardiovascular:  Negative for chest pain and palpitations.  Gastrointestinal:  Negative for abdominal pain, nausea and vomiting.  Neurological:  Negative for dizziness and weakness.       Objective    BP (!) 145/61 (BP Location: Right Arm, Patient Position: Sitting,  Cuff Size: Large)   Pulse 75   Temp 97.7 F (36.5 C)   Ht '5\' 2"'$  (1.575 m)   Wt 162 lb (73.5 kg)   SpO2 99%   BMI 29.63 kg/m    Physical Exam   General: Appearance:    Well developed, well nourished female in no acute distress  Eyes:    PERRL, conjunctiva/corneas clear, EOM's intact       Lungs:     Clear to auscultation bilaterally, respirations unlabored  Heart:    Normal heart rate. Normal rhythm. No murmurs, rubs, or gallops.    MS:   All extremities are intact.    Neurologic:   Awake, alert, oriented x 3. No apparent focal neurological defect.         Assessment & Plan     1. Type 2 diabetes mellitus with stage 1  chronic kidney disease, without long-term current use of insulin (HCC) Doing well on current medications.  - Hemoglobin A1c  2. Hyponatremia  - Renal function panel  3. Chronic cough Onset after having covid. Need to rule cardiac dysfunction. If labs below are normal will try montelukast.   4. Dyspnea, unspecified type  - Brain natriuretic peptide  5. Primary hypertension Well controlled.  Continue current medications.        The entirety of the information documented in the History of Present Illness, Review of Systems and Physical Exam were personally obtained by me. Portions of this information were initially documented by the CMA and reviewed by me for thoroughness and accuracy.     Lelon Huh, MD  [place] [phone] (phone) [phone] (fax)  [place]

## 2022-02-12 LAB — RENAL FUNCTION PANEL
Albumin: 4.6 g/dL (ref 3.7–4.7)
BUN/Creatinine Ratio: 22 (ref 12–28)
BUN: 21 mg/dL (ref 8–27)
CO2: 21 mmol/L (ref 20–29)
Calcium: 10.9 mg/dL — ABNORMAL HIGH (ref 8.7–10.3)
Chloride: 96 mmol/L (ref 96–106)
Creatinine, Ser: 0.96 mg/dL (ref 0.57–1.00)
Glucose: 139 mg/dL — ABNORMAL HIGH (ref 70–99)
Phosphorus: 3.5 mg/dL (ref 3.0–4.3)
Potassium: 4.9 mmol/L (ref 3.5–5.2)
Sodium: 134 mmol/L (ref 134–144)
eGFR: 58 mL/min/{1.73_m2} — ABNORMAL LOW (ref >59)

## 2022-02-12 LAB — BRAIN NATRIURETIC PEPTIDE: BNP: 32 pg/mL (ref 0.0–100.0)

## 2022-02-12 LAB — HEMOGLOBIN A1C
Est. average glucose Bld gHb Est-mCnc: 151 mg/dL
Hgb A1c MFr Bld: 6.9 % — ABNORMAL HIGH (ref 4.8–5.6)

## 2022-02-14 ENCOUNTER — Telehealth: Payer: Self-pay

## 2022-02-14 NOTE — Telephone Encounter (Signed)
Copied from [place] [id number]. Topic: General - Other >> Feb 14, 2022  9:10 AM Chapman Fitch wrote: Reason for CRM: Pt would like a call to discuss lab results/ please advise

## 2022-02-14 NOTE — Telephone Encounter (Signed)
Her A1c is good at 6.9%. The rest of her labs including her sodium level are all good. Continue current medications.  Marland Kitchen

## 2022-02-14 NOTE — Telephone Encounter (Signed)
Labs are in but not sign off by provider. Once Dr. Caryn Section review the results, we will contact patient

## 2022-02-15 NOTE — Telephone Encounter (Signed)
Attempted to reach pt, LMTCB 

## 2022-02-15 NOTE — Telephone Encounter (Signed)
Patient notified

## 2022-03-07 ENCOUNTER — Telehealth: Payer: Self-pay

## 2022-03-07 DIAGNOSIS — N760 Acute vaginitis: Secondary | ICD-10-CM

## 2022-03-07 NOTE — Telephone Encounter (Signed)
Copied from [place]. Topic: Referral - Request for Referral >> Mar 07, 2022 10:29 AM Marcellus Scott wrote: Has patient seen PCP for this complaint?  *If NO, is insurance requiring patient see PCP for this issue before PCP can refer them? Yes Referral for which specialty: OBGYN Preferred provider/office: [place] at [facility] / Rubie Maid, MD  Reason for referral: Pt stated her insurance asked for her to request a referral for OBGYN from [place]. Stated she already has an appointment but needs a referral sent as well for insurance purposes.  Pt is scheduled at [facility] at [facility] 02/21 w/Cherry, Dolphus Jenny, MD for Follow up hernia vulva itching

## 2022-03-12 NOTE — Progress Notes (Signed)
GYNECOLOGY PROGRESS NOTE  Subjective:    Patient ID: Natalie Bray, female    DOB: 10-09-1934, 88 y.o.   MRN: [id number]  HPI  Patient is a 87 y.o. G2P2002 female who presents for follow up on hernia and vulvar itching. Natalie Bray was last seen in 07/2020 where she was diagnosed with lichen sclerosis.  She has been treating the area with Clobetasol cream and it works well for her, however notes that the medication is expensive, and wonders if there is a more cost-effective option.    She also has concerns about a right inguinal hernia that she was diagnosed with in 2019. She notes that over the past few months she feels that it has moved or gotten bigger.  Notes that she has had some urinary incontinence due to bad coughing spells, and notes the bulge gets larger if she coughs or when she has a bowel movement. Denies significant straining as she uses laxatives. Is worried about having to have surgery as she feels she is to old to undergo any procedures.   Lastly, patient desires referral to a Cardiologist.  States that she is starting to feel winded, even with doing minimal activity. Notes that her husband had cardiac issues, and this was one of his initial symptoms.  Would like to be evaluated. Does not have a personal history of cardiac problems.    The following portions of the patient's history were reviewed and updated as appropriate:   She  has a past medical history of Anemia, Anemia, iron deficiency (08/08/2014), Anxiety, Diabetes mellitus without complication ([place]), GERD (gastroesophageal reflux disease), History of chicken pox, History of skin cancer, Hyperlipidemia, Hypertension, and Skin cancer.  She  has a past surgical history that includes Prolapsed bladder (03/2013); Replacement total knee (Left, 01/2011); Replacement total knee (Right, 03/2010); Total abdominal hysterectomy w/ bilateral salpingoophorectomy (1974); Abdominal hysterectomy; Cataract extraction (Bilateral); and Carpal  tunnel release (Right, 02/21/2021).  Her family history includes Aneurysm in her father; Diabetes in her brother, brother, sister, and sister; Hypertension in her brother, brother, sister, and sister; Kidney failure in her mother.  She  reports that she has never smoked. She has never used smokeless tobacco. She reports that she does not drink alcohol and does not use drugs.  Current Outpatient Medications on File Prior to Visit  Medication Sig Dispense Refill   acetaminophen (TYLENOL) 500 MG tablet Take 1,000 mg by mouth every 6 (six) hours as needed for moderate pain.     amLODipine (NORVASC) 10 MG tablet TAKE 1 TABLET EVERY DAY 90 tablet 4   aspirin 81 MG chewable tablet Chew 81 mg by mouth daily.     calcium-vitamin D (OSCAL WITH D) 500-200 MG-UNIT tablet Take 1 tablet by mouth daily.     Cholecalciferol (VITAMIN D3) 50 MCG (2000 UT) TABS Take 1 tablet by mouth daily.     cloNIDine (CATAPRES) 0.1 MG tablet TAKE 1 TABLET AS NEEDED FOR BLOOD PRESSURE OVER 180. MAY REPEAT IN 30 MINUTES. DO NOT TAKE MORE THAN 3 IN A DAY 30 tablet 1   docusate sodium (COLACE) 100 MG capsule Take 100 mg by mouth daily.     doxycycline (VIBRA-TABS) 100 MG tablet Take 1 tablet (100 mg total) by mouth 2 (two) times daily. 20 tablet 0   FLUoxetine (PROZAC) 10 MG capsule TAKE 1 CAPSULE EVERY DAY 90 capsule 4   glipiZIDE (GLUCOTROL XL) 5 MG 24 hr tablet TAKE 1 TABLET EVERY DAY WITH BREAKFAST 90 tablet 4  lovastatin (MEVACOR) 40 MG tablet TAKE 1 TABLET EVERY DAY 90 tablet 4   metFORMIN (GLUCOPHAGE) 1000 MG tablet TAKE 1 TABLET TWICE DAILY 180 tablet 4   NEOMYCIN-POLYMYXIN-HYDROCORTISONE (CORTISPORIN) 1 % SOLN OTIC solution Apply 1-2 drops to toe BID after soaking 10 mL 1   Omega-3 1000 MG CAPS Take 1,000 mg by mouth daily.     omeprazole (PRILOSEC) 20 MG capsule TAKE 1 CAPSULE EVERY DAY 90 capsule 4   spironolactone (ALDACTONE) 50 MG tablet TAKE 1 TABLET EVERY DAY (DOSE CHANGE) 90 tablet 3   traZODone (DESYREL) 100 MG  tablet Take 0.5-1 tablets (50-100 mg total) by mouth at bedtime as needed for sleep.     TRUE METRIX BLOOD GLUCOSE TEST test strip TEST AS DIRECTED 300 strip 0   TRUEplus Lancets 33G MISC TEST BLOOD SUGAR EVERY DAY 100 each 4   valsartan-hydrochlorothiazide (DIOVAN-HCT) 320-25 MG tablet TAKE 1 TABLET EVERY DAY 90 tablet 0   vitamin E 180 MG (400 UNITS) capsule Take 1 capsule by mouth daily.     No current facility-administered medications on file prior to visit.   She is allergic to betadine [povidone iodine] and celecoxib.Marland Kitchen  Review of Systems Pertinent items are noted in HPI.   Objective:   Blood pressure (!) 148/72, pulse 68, height '5\' 2"'$  (1.575 m), weight 162 lb 8 oz (73.7 kg). Body mass index is 29.72 kg/m. General appearance: alert, cooperative, and no distress Abdomen: soft, non-tender; bowel sounds normal; no masses,  no organomegaly. Palpable hernia noted in lower abdomen/inguinal region, non-incarcerated.  Pelvic: external genitalia with mild lichenification of bilateral labia majora.  Vagina without discharge, atrophic.  No leakage of urine on Valsalva, minimal bladder descensus. Uterus and cervix surgically absent. Bimanual exam not performed.   Extremities: extremities normal, atraumatic, no cyanosis or edema Neurologic: Grossly normal   Assessment:   1. Lichen sclerosus et atrophicus of the vulva   2. Stress incontinence of urine   3. Inguinal hernia of right side without obstruction or gangrene   4. Shortness of breath at rest      Plan:   1. Lichen sclerosus et atrophicus of the vulva - Advised to continue steroid treatment with active flares. Will attempt to find more cost-effective option that will be therapeutic.   2. Stress incontinence of urine - Patient reports h/o 2 "bladder tacks" in the past. No obvious bladder prolapse noted on today's exam.  Discussed option of use of Kegel exercises, pessary. Patient would like to avoid surgery.  Notes she has tried  several pessaries in the remote past, all expelled.    3. Inguinal hernia of right side without obstruction or gangrene - Inguinal hernia present since 2019, may be slightly increased in size. Discussed use of pelvic girdle to reduce further bulging. Continue to keep stools soft to decrease straining. Patient does not desire to have surgical intervention due to her age.   4. Shortness of breath at rest - Reporting some dyspnea at rest. Concerned about cardiac cause, although she has no prior history.  Has not mentioned to her PCP, so encourage to do so. Requests list of local cardiologists so that she can contact.    RTC in 1 year for annual f/u of lichen sclerosis.   A total of 25 minutes were spent face-to-face with the patient during this encounter and over half of that time involved counseling and coordination of care.  Rubie Maid, MD [place]

## 2022-03-13 ENCOUNTER — Ambulatory Visit: Payer: Medicare HMO | Admitting: Obstetrics and Gynecology

## 2022-03-13 VITALS — BP 148/72 | HR 68 | Ht 62.0 in | Wt 162.5 lb

## 2022-03-13 DIAGNOSIS — N904 Leukoplakia of vulva: Secondary | ICD-10-CM

## 2022-03-13 DIAGNOSIS — K4090 Unilateral inguinal hernia, without obstruction or gangrene, not specified as recurrent: Secondary | ICD-10-CM

## 2022-03-13 DIAGNOSIS — N393 Stress incontinence (female) (male): Secondary | ICD-10-CM | POA: Diagnosis not present

## 2022-03-13 DIAGNOSIS — N3941 Urge incontinence: Secondary | ICD-10-CM

## 2022-03-13 DIAGNOSIS — R0602 Shortness of breath: Secondary | ICD-10-CM | POA: Diagnosis not present

## 2022-03-13 DIAGNOSIS — L292 Pruritus vulvae: Secondary | ICD-10-CM

## 2022-03-14 ENCOUNTER — Encounter: Payer: Self-pay | Admitting: Obstetrics and Gynecology

## 2022-03-15 ENCOUNTER — Telehealth: Payer: Self-pay | Admitting: Obstetrics and Gynecology

## 2022-03-18 ENCOUNTER — Telehealth: Payer: Self-pay

## 2022-03-18 DIAGNOSIS — I6523 Occlusion and stenosis of bilateral carotid arteries: Secondary | ICD-10-CM

## 2022-03-18 DIAGNOSIS — I10 Essential (primary) hypertension: Secondary | ICD-10-CM

## 2022-03-18 NOTE — Telephone Encounter (Signed)
Is she just looking to transfer to a new cardiologist since Dr. Nehemiah Massed is retiring, or does she having something new going on?

## 2022-03-18 NOTE — Telephone Encounter (Signed)
Copied from [place] [id number]. Topic: Referral - Status >> Mar 18, 2022 10:05 AM Cyndi Bender wrote: Reason for CRM: Pt requests that the referral be sent to cardiologist Dr. Esmond Plants

## 2022-03-19 ENCOUNTER — Telehealth: Payer: Self-pay

## 2022-03-19 MED ORDER — CLOBETASOL PROPIONATE 0.05 % EX OINT: TOPICAL_OINTMENT | CUTANEOUS | 5 refills | Status: DC

## 2022-03-19 NOTE — Telephone Encounter (Signed)
Please inform that my nurse contacted the pharmacy at [facility] and said that the Clobetasol she had previously been using should only cost her $9, so I didn't send in a prescription for a new cream since this seemed to be a reasonable price. Does she need a new prescription for this cream?

## 2022-03-19 NOTE — Addendum Note (Signed)
Addended by: Birdie Sons on: 03/19/2022 12:43 PM   Modules accepted: Orders

## 2022-03-19 NOTE — Telephone Encounter (Signed)
She said pharmacist stated she did need the new RX sent for the cream and she is ok to pay the $9.00's for it .

## 2022-03-22 ENCOUNTER — Ambulatory Visit (INDEPENDENT_AMBULATORY_CARE_PROVIDER_SITE_OTHER): Payer: Medicare HMO | Admitting: Family Medicine

## 2022-03-22 VITALS — BP 136/63 | HR 88 | Temp 97.9°F | Wt 168.0 lb

## 2022-03-22 DIAGNOSIS — R059 Cough, unspecified: Secondary | ICD-10-CM

## 2022-03-22 DIAGNOSIS — J0110 Acute frontal sinusitis, unspecified: Secondary | ICD-10-CM | POA: Diagnosis not present

## 2022-03-22 LAB — POC COVID19 BINAXNOW: SARS Coronavirus 2 Ag: NEGATIVE

## 2022-03-22 MED ORDER — AZITHROMYCIN 250 MG PO TABS: ORAL_TABLET | ORAL | 0 refills | Status: AC

## 2022-03-22 MED ORDER — HYDROCODONE BIT-HOMATROP MBR 5-1.5 MG/5ML PO SOLN: 2.5000 mL | Freq: Three times a day (TID) | ORAL | 0 refills | Status: DC | PRN

## 2022-03-22 NOTE — Progress Notes (Signed)
Established patient visit   Patient: Natalie Bray   DOB: 1934-02-05   88 y.o. Female  MRN: [id number] Visit Date: 03/22/2022  Today's healthcare provider: Lelon Huh, MD   No chief complaint on file.  Subjective    HPI  Patient is an 88 year old female who presents for evaluation of cough and congestion.   She states her symptoms started 2-3 days ago.  Patient states that she has been coughing so much she is unable to get sleep. Also sinus pressure and drainage. No fevers, but a little achy.   Medications: Outpatient Medications Prior to Visit  Medication Sig   acetaminophen (TYLENOL) 500 MG tablet Take 1,000 mg by mouth every 6 (six) hours as needed for moderate pain.   amLODipine (NORVASC) 10 MG tablet TAKE 1 TABLET EVERY DAY   aspirin 81 MG chewable tablet Chew 81 mg by mouth daily.   calcium-vitamin D (OSCAL WITH D) 500-200 MG-UNIT tablet Take 1 tablet by mouth daily.   Cholecalciferol (VITAMIN D3) 50 MCG (2000 UT) TABS Take 1 tablet by mouth daily.   clobetasol ointment (TEMOVATE) 0.05 % Apply to affected area every night for 4 weeks, then every other day for 4 weeks and then twice a week for 4 weeks or until resolution.   cloNIDine (CATAPRES) 0.1 MG tablet TAKE 1 TABLET AS NEEDED FOR BLOOD PRESSURE OVER 180. MAY REPEAT IN 30 MINUTES. DO NOT TAKE MORE THAN 3 IN A DAY   docusate sodium (COLACE) 100 MG capsule Take 100 mg by mouth daily.   doxycycline (VIBRA-TABS) 100 MG tablet Take 1 tablet (100 mg total) by mouth 2 (two) times daily.   FLUoxetine (PROZAC) 10 MG capsule TAKE 1 CAPSULE EVERY DAY   glipiZIDE (GLUCOTROL XL) 5 MG 24 hr tablet TAKE 1 TABLET EVERY DAY WITH BREAKFAST   lovastatin (MEVACOR) 40 MG tablet TAKE 1 TABLET EVERY DAY   metFORMIN (GLUCOPHAGE) 1000 MG tablet TAKE 1 TABLET TWICE DAILY   NEOMYCIN-POLYMYXIN-HYDROCORTISONE (CORTISPORIN) 1 % SOLN OTIC solution Apply 1-2 drops to toe BID after soaking   Omega-3 1000 MG CAPS Take 1,000 mg by mouth daily.    omeprazole (PRILOSEC) 20 MG capsule TAKE 1 CAPSULE EVERY DAY   spironolactone (ALDACTONE) 50 MG tablet TAKE 1 TABLET EVERY DAY (DOSE CHANGE)   traZODone (DESYREL) 100 MG tablet Take 0.5-1 tablets (50-100 mg total) by mouth at bedtime as needed for sleep.   TRUE METRIX BLOOD GLUCOSE TEST test strip TEST AS DIRECTED   TRUEplus Lancets 33G MISC TEST BLOOD SUGAR EVERY DAY   valsartan-hydrochlorothiazide (DIOVAN-HCT) 320-25 MG tablet TAKE 1 TABLET EVERY DAY   vitamin E 180 MG (400 UNITS) capsule Take 1 capsule by mouth daily.   No facility-administered medications prior to visit.    Review of Systems  Constitutional:  Positive for fatigue and fever.  HENT:  Positive for congestion, postnasal drip, rhinorrhea, sinus pressure, sinus pain, sneezing and sore throat. Negative for ear discharge and ear pain.   Respiratory:  Positive for cough, shortness of breath and wheezing.   Cardiovascular:  Negative for chest pain, palpitations and leg swelling.      Objective    BP 136/63 (BP Location: Left Arm, Patient Position: Sitting, Cuff Size: Normal)   Pulse 88   Temp 97.9 F (36.6 C) (Oral)   Wt 168 lb (76.2 kg)   SpO2 98%   BMI 30.73 kg/m    Physical Exam   General Appearance:    Mildly  obese female, alert, cooperative, in no acute distress  HENT:   bilateral TM normal without fluid or infection, neck without nodes, frontal sinus tender, and post nasal drip noted  Eyes:    PERRL, conjunctiva/corneas clear, EOM's intact       Lungs:     Clear to auscultation bilaterally, respirations unlabored  Heart:    Normal heart rate. Normal rhythm. No murmurs, rubs, or gallops.    Neurologic:   Awake, alert, oriented x 3. No apparent focal neurological           defect.        Results for orders placed or performed in visit on 03/22/22  POC COVID-19  Result Value Ref Range   SARS Coronavirus 2 Ag Negative Negative    Assessment & Plan     1. Cough, unspecified type  - HYDROcodone  bit-homatropine (HYCODAN) 5-1.5 MG/5ML syrup; Take 2.5-5 mLs by mouth every 8 (eight) hours as needed for cough.  Dispense: 120 mL; Refill: 0  2. Acute non-recurrent frontal sinusitis  - azithromycin (ZITHROMAX) 250 MG tablet; Take 2 tablets on day 1, then 1 tablet daily on days 2 through 5  Dispense: 6 tablet; Refill: 0   Call if symptoms change or if not rapidly improving.        The entirety of the information documented in the History of Present Illness, Review of Systems and Physical Exam were personally obtained by me. Portions of this information were initially documented by the CMA and reviewed by me for thoroughness and accuracy.     Lelon Huh, MD  [place] [phone] (phone) [phone] (fax)  [place]

## 2022-04-01 ENCOUNTER — Other Ambulatory Visit: Payer: Self-pay | Admitting: Family Medicine

## 2022-04-02 NOTE — Telephone Encounter (Signed)
Requested Prescriptions  Pending Prescriptions Disp Refills   TRUE METRIX BLOOD GLUCOSE TEST test strip [Pharmacy Med Name: TRUE METRIX SELF MONITORING BLOOD GLUCOSE STRIPS   Strip] 100 strip 3    Sig: TEST AS DIRECTED     Endocrinology: Diabetes - Testing Supplies Passed - 04/01/2022  2:23 AM      Passed - Valid encounter within last 12 months    Recent Outpatient Visits           1 week ago Cough, unspecified type   [facility] Birdie Sons, MD   1 month ago Type 2 diabetes mellitus with stage 1 chronic kidney disease, without long-term current use of insulin ([place])   [place], Donald E, MD   5 months ago Primary hypertension   [place], Donald E, MD   5 months ago Primary hypertension   [place], Donald E, MD   10 months ago Annual physical exam   [facility] Birdie Sons, MD       Future Appointments             In 1 month Fisher, Kirstie Peri, MD [facility]   In 1 month Gollan, Kathlene November, MD [place] [facility]

## 2022-04-05 ENCOUNTER — Other Ambulatory Visit: Payer: Self-pay | Admitting: Family Medicine

## 2022-04-23 ENCOUNTER — Ambulatory Visit: Payer: Self-pay | Admitting: *Deleted

## 2022-04-23 NOTE — Telephone Encounter (Signed)
Message from [place] sent at 04/23/2022  9:45 AM EDT  Summary: Pt has high bp and wants to know what sinus med she can take   Pt requests call back to advise what type of sinus medication she can take because she has high blood pressure. Cb# [phone]          Call History   Type Contact Phone/Fax User  04/23/2022 09:40 AM EDT Phone (Incoming) Sanvitha, [place] (Self) [phone] Carmina Miller, Jacinto Reap   Reason for Disposition  [1] Sinus congestion as part of a cold AND [2] present < 10 days    Wanted to know what she could take OTC for the sinus congestion that would not bother her hypertension  Answer Assessment - Initial Assessment Questions 1. LOCATION: "Where does it hurt?"      What can I take OTC for sinus congestion?    I'm sneezing and nose running.   No sore throat or fever.   No triage done as she just wanted to know what she could take OTC for sinus congestion.    I let her know she could take Coricidin HBP for the congestion and it would not affect her BP.     She thanked me very much for calling her back.    2. ONSET: "When did the sinus pain start?"  (e.g., hours, days)       3. SEVERITY: "How bad is the pain?"   (Scale 1-10; mild, moderate or severe)   - MILD (1-3): doesn't interfere with normal activities    - MODERATE (4-7): interferes with normal activities (e.g., work or school) or awakens from sleep   - SEVERE (8-10): excruciating pain and patient unable to do any normal activities         4. RECURRENT SYMPTOM: "Have you ever had sinus problems before?" If Yes, ask: "When was the last time?" and "What happened that time?"       5. NASAL CONGESTION: "Is the nose blocked?" If Yes, ask: "Can you open it or must you breathe through your mouth?"      6. NASAL DISCHARGE: "Do you have discharge from your nose?" If so ask, "What color?"     Runny nose 7. FEVER: "Do you have a fever?" If Yes, ask: "What is it, how was it measured, and when did it start?"       No 8. OTHER SYMPTOMS: "Do you have any other symptoms?" (e.g., sore throat, cough, earache, difficulty breathing)     No   Just the sinus congestion and runny nose. 9. PREGNANCY: "Is there any chance you are pregnant?" "When was your last menstrual period?"     N/A due to age  Protocols used: Sinus Pain or Congestion-A-AH

## 2022-04-23 NOTE — Telephone Encounter (Signed)
  Chief Complaint: Wanted to know what OTC medication she could take for sinus congestion that would not interfere with her hypertension. Symptoms: Runny nose, sinus congestion Frequency: not asked Pertinent Negatives: Patient denies Fever, sore throat or coughing Disposition: [] ED /[] Urgent Care (no appt availability in office) / [] Appointment(In office/virtual)/ []  [facility]/ [x] Home Care/ [] Refused Recommended Disposition /[] [facility]/ []  Follow-up with PCP Additional Notes: Let pt. Know she can take Coricidin HBP without elevating her BP for the sinus congestion.   This can be purchased OTC. She thanked me for calling back and for my help.

## 2022-05-13 ENCOUNTER — Ambulatory Visit (INDEPENDENT_AMBULATORY_CARE_PROVIDER_SITE_OTHER): Payer: Medicare HMO | Admitting: Family Medicine

## 2022-05-13 VITALS — BP 141/60 | HR 82 | Wt 161.9 lb

## 2022-05-13 DIAGNOSIS — J301 Allergic rhinitis due to pollen: Secondary | ICD-10-CM | POA: Diagnosis not present

## 2022-05-13 DIAGNOSIS — E1122 Type 2 diabetes mellitus with diabetic chronic kidney disease: Secondary | ICD-10-CM

## 2022-05-13 DIAGNOSIS — I10 Essential (primary) hypertension: Secondary | ICD-10-CM | POA: Diagnosis not present

## 2022-05-13 DIAGNOSIS — R059 Cough, unspecified: Secondary | ICD-10-CM

## 2022-05-13 DIAGNOSIS — N181 Chronic kidney disease, stage 1: Secondary | ICD-10-CM

## 2022-05-13 LAB — POCT GLYCOSYLATED HEMOGLOBIN (HGB A1C)
Est. average glucose Bld gHb Est-mCnc: 148
Hemoglobin A1C: 6.8 % — AB (ref 4.0–5.6)

## 2022-05-13 MED ORDER — MONTELUKAST SODIUM 10 MG PO TABS
10.0000 mg | ORAL_TABLET | Freq: Every day | ORAL | 0 refills | Status: DC
Start: 2022-05-13 — End: 2022-05-27

## 2022-05-13 NOTE — Progress Notes (Signed)
I,Natalie Bray,acting as a Neurosurgeon for Natalie Merry, MD.,have documented all relevant documentation on the behalf of Natalie Merry, MD,as directed by  Natalie Merry, MD while in the presence of Natalie Merry, MD.   Established patient visit   Patient: Natalie Bray   DOB: 08/20/1934   88 y.o. Female  MRN: [id number] Visit Date: 05/13/2022  Today's healthcare provider: Mila Merry, MD     Subjective    HPI  Diabetes Mellitus Type II, Follow-up  Lab Results  Component Value Date   HGBA1C 6.9 (H) 02/11/2022   HGBA1C 6.8 (H) 10/08/2021   HGBA1C 6.6 (H) 06/05/2021   Wt Readings from Last 3 Encounters:  03/22/22 168 lb (76.2 kg)  03/13/22 162 lb 8 oz (73.7 kg)  02/11/22 162 lb (73.5 kg)   Last seen for diabetes 3 months ago.  Management since then includes continue current treatment.  Symptoms: Yes fatigue No foot ulcerations  No appetite changes No nausea  No paresthesia of the feet  No polydipsia  No polyuria No visual disturbances   No vomiting    Home blood sugar records: fasting range: 80-140 Episodes of hypoglycemia? Yes upset stomach and shakes.   Current insulin regiment: none Most Recent Eye Exam: 04/19/20 Pertinent Labs: Lab Results  Component Value Date   CHOL 174 06/05/2021   HDL 53 06/05/2021   LDLCALC 69 06/05/2021   TRIG 327 (H) 06/05/2021   CHOLHDL 3.3 06/05/2021   Lab Results  Component Value Date   NA 134 02/11/2022   K 4.9 02/11/2022   CREATININE 0.96 02/11/2022   EGFR 58 (L) 02/11/2022   LABMICR 19.2 06/05/2021   MICRALBCREAT 53 (H) 06/05/2021     ---------------------------------------------------------------------------------------------------  Hypertension, follow-up  BP Readings from Last 3 Encounters:  03/22/22 136/63  03/13/22 (!) 148/72  02/11/22 (!) 145/61   Wt Readings from Last 3 Encounters:  03/22/22 168 lb (76.2 kg)  03/13/22 162 lb 8 oz (73.7 kg)  02/11/22 162 lb (73.5 kg)     She was last seen for  hypertension 3 months ago.  BP at that visit was 145/61. Management since that visit includes continue current treatment.  Outside blood pressures are no being checked regularly.  Symptoms: No chest pain No chest pressure  No palpitations No syncope  No dyspnea No orthopnea  No paroxysmal nocturnal dyspnea No lower extremity edema   Pertinent labs Lab Results  Component Value Date   CHOL 174 06/05/2021   HDL 53 06/05/2021   LDLCALC 69 06/05/2021   TRIG 327 (H) 06/05/2021   CHOLHDL 3.3 06/05/2021   Lab Results  Component Value Date   NA 134 02/11/2022   K 4.9 02/11/2022   CREATININE 0.96 02/11/2022   EGFR 58 (L) 02/11/2022   GLUCOSE 139 (H) 02/11/2022   TSH 2.170 10/23/2021     The ASCVD Risk score (Arnett DK, et al., 2019) failed to calculate for the following reasons:   The 2019 ASCVD risk score is only valid for ages 67 to 26  ---------------------------------------------------------------------------------------------------  She also complains of persistent cough for the last several weeks. She was seen in early March and treated for sinus infection with azithromycin and hycodan cough syrup. Not having anymore sinus pain or pressure, but cough never cleared. Is gets worse when she goes outside, and is sometime productive clear sputum and associated with nasal  congestion and discharge.    Medications: Outpatient Medications Prior to Visit  Medication Sig   acetaminophen (TYLENOL) 500  MG tablet Take 1,000 mg by mouth every 6 (six) hours as needed for moderate pain.   amLODipine (NORVASC) 10 MG tablet TAKE 1 TABLET EVERY DAY   aspirin 81 MG chewable tablet Chew 81 mg by mouth daily.   calcium-vitamin D (OSCAL WITH D) 500-200 MG-UNIT tablet Take 1 tablet by mouth daily.   Cholecalciferol (VITAMIN D3) 50 MCG (2000 UT) TABS Take 1 tablet by mouth daily.   clobetasol ointment (TEMOVATE) 0.05 % Apply to affected area every night for 4 weeks, then every other day for 4 weeks and  then twice a week for 4 weeks or until resolution.   cloNIDine (CATAPRES) 0.1 MG tablet TAKE 1 TABLET AS NEEDED FOR BLOOD PRESSURE OVER 180. MAY REPEAT IN 30 MINUTES. DO NOT TAKE MORE THAN 3 IN A DAY   docusate sodium (COLACE) 100 MG capsule Take 100 mg by mouth daily.   doxycycline (VIBRA-TABS) 100 MG tablet Take 1 tablet (100 mg total) by mouth 2 (two) times daily.   FLUoxetine (PROZAC) 10 MG capsule TAKE 1 CAPSULE EVERY DAY   glipiZIDE (GLUCOTROL XL) 5 MG 24 hr tablet TAKE 1 TABLET EVERY DAY WITH BREAKFAST   HYDROcodone bit-homatropine (HYCODAN) 5-1.5 MG/5ML syrup Take 2.5-5 mLs by mouth every 8 (eight) hours as needed for cough.   lovastatin (MEVACOR) 40 MG tablet TAKE 1 TABLET EVERY DAY   metFORMIN (GLUCOPHAGE) 1000 MG tablet TAKE 1 TABLET TWICE DAILY   NEOMYCIN-POLYMYXIN-HYDROCORTISONE (CORTISPORIN) 1 % SOLN OTIC solution Apply 1-2 drops to toe BID after soaking   Omega-3 1000 MG CAPS Take 1,000 mg by mouth daily.   omeprazole (PRILOSEC) 20 MG capsule TAKE 1 CAPSULE EVERY DAY   spironolactone (ALDACTONE) 50 MG tablet TAKE 1 TABLET EVERY DAY (DOSE CHANGE)   traZODone (DESYREL) 100 MG tablet Take 0.5-1 tablets (50-100 mg total) by mouth at bedtime as needed for sleep.   TRUE METRIX BLOOD GLUCOSE TEST test strip TEST AS DIRECTED   TRUEplus Lancets 33G MISC TEST BLOOD SUGAR EVERY DAY   valsartan-hydrochlorothiazide (DIOVAN-HCT) 320-25 MG tablet TAKE 1 TABLET EVERY DAY   vitamin E 180 MG (400 UNITS) capsule Take 1 capsule by mouth daily.   No facility-administered medications prior to visit.    Review of Systems  Constitutional:  Negative for appetite change, chills, fatigue and fever.  Respiratory:  Negative for chest tightness and shortness of breath.   Cardiovascular:  Negative for chest pain and palpitations.  Gastrointestinal:  Negative for abdominal pain, nausea and vomiting.  Neurological:  Negative for dizziness and weakness.      Objective    BP (!) 141/60 (BP Location: Left  Arm, Patient Position: Sitting, Cuff Size: Normal)   Pulse 82   Wt 161 lb 14.4 oz (73.4 kg)   SpO2 100%   BMI 29.61 kg/m    Physical Exam  General Appearance:    Well developed, well nourished female, alert, cooperative, in no acute distress  HENT:   bilateral TM normal without fluid or infection, neck without nodes, sinuses nontender, post nasal drip noted, and nasal mucosa pale and congested  Eyes:    PERRL, conjunctiva/corneas clear, EOM's intact       Lungs:     Clear to auscultation bilaterally, respirations unlabored  Heart:    Normal heart rate. Normal rhythm. No murmurs, rubs, or gallops.    Neurologic:   Awake, alert, oriented x 3. No apparent focal neurological           defect.  Results for orders placed or performed in visit on 05/13/22  POCT HgB A1C  Result Value Ref Range   Hemoglobin A1C 6.8 (A) 4.0 - 5.6 %   Est. average glucose Bld gHb Est-mCnc 148     Assessment & Plan     1. Type 2 diabetes mellitus with stage 1 chronic kidney disease, without long-term current use of insulin Well controlled.  Continue current medications.    2. Primary hypertension BP near goal. Continue current medications.    3. Cough, unspecified type Persistent since treated for sinusitis 7 weeks ago, likely some persistent underlying allergies as below.  - montelukast (SINGULAIR) 10 MG tablet; Take 1 tablet (10 mg total) by mouth at bedtime.  Dispense: 30 tablet RF x 0  4. Allergic rhinitis due to pollen, unspecified seasonality  Continue OTC loratadine and all montelukast as above.    Future Appointments  Date Time Provider Department Center  05/27/2022 10:40 AM Antonieta Iba, MD [facility] None  06/10/2022  9:15 AM [facility]ANNUAL WELLNESS VISIT [facility]  09/13/2022  9:00 AM Malva Limes, MD [facility]        The entirety of the information documented in the History of Present Illness, Review of Systems and Physical Exam were personally obtained by me. Portions of this  information were initially documented by the CMA and reviewed by me for thoroughness and accuracy.     Natalie Merry, MD  [facility] [phone] (phone) [phone] (fax)  [facility]

## 2022-05-13 NOTE — Patient Instructions (Signed)
.   Please review the attached list of medications and notify my office if there are any errors.   . Please bring all of your medications to every appointment so we can make sure that our medication list is the same as yours.   

## 2022-05-26 NOTE — Progress Notes (Unsigned)
Cardiology Office Note  Date:  05/27/2022   ID:  Natalie Bray, DOB Feb 28, 1934, MRN [id number]  PCP:  Malva Limes, MD   Chief Complaint  Patient presents with   New Patient (Initial Visit)    Patient has a Hx of bilateral carotid artery stenosis. Patient c/o shortness of breath with little to no exertion and occasional chest pain that comes and goes. Medications reviewed by the patient verbally.     HPI:  [name] is a 88 year old woman with past medical history of Hypertension PAD, bilateral carotid artery stenosis less than 50% bilaterally 2022  Diabetes Hyperlipidemia on lovastatin Who presents by referral from Dr. Sherrie Mustache for hypertension, bilateral carotid stenosis  On discussions today, reports that she needs to establish for cardiovascular health, previously seen at [facility]  For blood pressure, on valsartan/hydrochlorothiazide 320-25, spironolactone 25 mg, amlodipine 10 mg daily for blood pressure  Clonidine 0.1 daily PRN for SBP >180, anxiety, has not had to take it  Lab work reviewed A1C 6.8 Total chol 174, LDL 69  Can't walk very far, legs weaker Denies chest pain or shortness of breath, no leg swelling  Last carotid u/s 11/22, results not available ([facility]) Notes indicating less than 50% disease  Echo 11/22 NORMAL LEFT VENTRICULAR SYSTOLIC FUNCTION  NORMAL RIGHT VENTRICULAR SYSTOLIC FUNCTION  NO VALVULAR STENOSIS  MILD MR, TR, PR  EF 55%   Myoview 11/22: no ischemia  EKG personally reviewed by myself on todays visit NSR rate 102 no signifcant ST or T wave changes   PMH:   has a past medical history of Anemia, Anemia, iron deficiency (08/08/2014), Anxiety, Diabetes mellitus without complication (HCC), GERD (gastroesophageal reflux disease), History of chicken pox, History of skin cancer, Hyperlipidemia, Hypertension, and Skin cancer.  PSH:    Past Surgical History:  Procedure Laterality Date   ABDOMINAL HYSTERECTOMY     CARPAL TUNNEL RELEASE  Right 02/21/2021   Procedure: CARPAL TUNNEL RELEASE ENDOSCOPIC;  Surgeon: Christena Flake, MD;  Location: [facility] ORS;  Service: Orthopedics;  Laterality: Right;   CATARACT EXTRACTION Bilateral    Prolapsed bladder  03/2013   Dr. Ashley Royalty   REPLACEMENT TOTAL KNEE Left 01/2011   [facility]   REPLACEMENT TOTAL KNEE Right 03/2010   TOTAL ABDOMINAL HYSTERECTOMY W/ BILATERAL SALPINGOOPHORECTOMY  1974   [facility]O    Current Outpatient Medications  Medication Sig Dispense Refill   acetaminophen (TYLENOL) 500 MG tablet Take 1,000 mg by mouth every 6 (six) hours as needed for moderate pain.     amLODipine (NORVASC) 10 MG tablet TAKE 1 TABLET EVERY DAY 90 tablet 4   aspirin 81 MG chewable tablet Chew 81 mg by mouth daily.     calcium-vitamin D (OSCAL WITH D) 500-200 MG-UNIT tablet Take 1 tablet by mouth daily.     Cholecalciferol (VITAMIN D3) 50 MCG (2000 UT) TABS Take 1 tablet by mouth daily.     clobetasol ointment (TEMOVATE) 0.05 % Apply to affected area every night for 4 weeks, then every other day for 4 weeks and then twice a week for 4 weeks or until resolution. 30 g 5   cloNIDine (CATAPRES) 0.1 MG tablet TAKE 1 TABLET AS NEEDED FOR BLOOD PRESSURE OVER 180. MAY REPEAT IN 30 MINUTES. DO NOT TAKE MORE THAN 3 IN A DAY 30 tablet 1   docusate sodium (COLACE) 100 MG capsule Take 100 mg by mouth daily.     FLUoxetine (PROZAC) 10 MG capsule TAKE 1 CAPSULE EVERY DAY 90 capsule 4  glipiZIDE (GLUCOTROL XL) 5 MG 24 hr tablet TAKE 1 TABLET EVERY DAY WITH BREAKFAST 90 tablet 4   HYDROcodone bit-homatropine (HYCODAN) 5-1.5 MG/5ML syrup Take 2.5-5 mLs by mouth every 8 (eight) hours as needed for cough. 120 mL 0   lovastatin (MEVACOR) 40 MG tablet TAKE 1 TABLET EVERY DAY 90 tablet 4   metFORMIN (GLUCOPHAGE) 1000 MG tablet TAKE 1 TABLET TWICE DAILY 180 tablet 4   Omega-3 1000 MG CAPS Take 1,000 mg by mouth daily.     omeprazole (PRILOSEC) 20 MG capsule TAKE 1 CAPSULE EVERY DAY 90 capsule 4   spironolactone (ALDACTONE) 50 MG  tablet TAKE 1 TABLET EVERY DAY (DOSE CHANGE) 90 tablet 3   traZODone (DESYREL) 100 MG tablet Take 0.5-1 tablets (50-100 mg total) by mouth at bedtime as needed for sleep.     TRUE METRIX BLOOD GLUCOSE TEST test strip TEST AS DIRECTED 100 strip 3   TRUEplus Lancets 33G MISC TEST BLOOD SUGAR EVERY DAY 100 each 4   valsartan-hydrochlorothiazide (DIOVAN-HCT) 320-25 MG tablet TAKE 1 TABLET EVERY DAY 90 tablet 1   vitamin E 180 MG (400 UNITS) capsule Take 1 capsule by mouth daily.     No current facility-administered medications for this visit.     Allergies:   Betadine [povidone iodine], Celecoxib, and Povidone-iodine   Social History:  The patient  reports that she has never smoked. She has never used smokeless tobacco. She reports that she does not drink alcohol and does not use drugs.   Family History:   family history includes Aneurysm in her father; Diabetes in her brother, brother, sister, and sister; Hypertension in her brother, brother, sister, and sister; Kidney failure in her mother.    Review of Systems: Review of Systems  Constitutional: Negative.   HENT: Negative.    Respiratory: Negative.    Cardiovascular: Negative.   Gastrointestinal: Negative.   Musculoskeletal: Negative.   Neurological: Negative.   Psychiatric/Behavioral: Negative.    All other systems reviewed and are negative.    PHYSICAL EXAM: VS:  BP (!) 130/50 (BP Location: Right Arm, Patient Position: Sitting, Cuff Size: Normal)   Pulse (!) 102   Ht 5\' 2"  (1.575 m)   Wt 163 lb 4 oz (74 kg)   SpO2 98%   BMI 29.86 kg/m  , BMI Body mass index is 29.86 kg/m. GEN: Well nourished, well developed, in no acute distress HEENT: normal Neck: no JVD, carotid bruits, or masses Cardiac: RRR; no murmurs, rubs, or gallops,no edema  Respiratory:  clear to auscultation bilaterally, normal work of breathing GI: soft, nontender, nondistended, + BS MS: no deformity or atrophy Skin: warm and dry, no rash Neuro:  Strength  and sensation are intact Psych: euthymic mood, full affect  Recent Labs: 10/23/2021: ALT 20; Hemoglobin 13.3; Magnesium 2.0; Platelets 363; TSH 2.170 02/11/2022: BNP 32.0; BUN 21; Creatinine, Ser 0.96; Potassium 4.9; Sodium 134    Lipid Panel Lab Results  Component Value Date   CHOL 174 06/05/2021   HDL 53 06/05/2021   LDLCALC 69 06/05/2021   TRIG 327 (H) 06/05/2021      Wt Readings from Last 3 Encounters:  05/27/22 163 lb 4 oz (74 kg)  05/13/22 161 lb 14.4 oz (73.4 kg)  03/22/22 168 lb (76.2 kg)       ASSESSMENT AND PLAN:  Problem List Items Addressed This Visit       Cardiology Problems   Bilateral carotid artery stenosis - Primary   Hypertension   Hyperlipidemia  Other   Type 2 diabetes mellitus with diabetic chronic kidney disease (HCC)   PAD, carotid stenosis Nonobstructive disease on ultrasound 2022 No significant bruits appreciated on exam Could consider repeat carotid ultrasound next year Cholesterol reasonable on lovastatin  Hyperlipidemia Continue lovastatin 40 mg daily If numbers continue to trend upwards, may need to add Zetia 10 mg daily  Essential hypertension Blood pressure is well controlled on today's visit. No changes made to the medications.  Diabetes type 2 We have encouraged continued exercise, careful diet management     Total encounter time more than 60 minutes  Greater than 50% was spent in counseling and coordination of care with the patient    Signed, Dossie Arbour, M.D., Ph.D. [facility] [place], [place] [phone]

## 2022-05-27 ENCOUNTER — Ambulatory Visit: Payer: Medicare HMO | Attending: Cardiovascular Disease | Admitting: Cardiovascular Disease

## 2022-05-27 ENCOUNTER — Encounter: Payer: Self-pay | Admitting: Cardiovascular Disease

## 2022-05-27 VITALS — BP 130/50 | HR 102 | Ht 62.0 in | Wt 163.2 lb

## 2022-05-27 DIAGNOSIS — E1151 Type 2 diabetes mellitus with diabetic peripheral angiopathy without gangrene: Secondary | ICD-10-CM | POA: Diagnosis not present

## 2022-05-27 DIAGNOSIS — E782 Mixed hyperlipidemia: Secondary | ICD-10-CM

## 2022-05-27 DIAGNOSIS — I6523 Occlusion and stenosis of bilateral carotid arteries: Secondary | ICD-10-CM

## 2022-05-27 DIAGNOSIS — Z7985 Long-term (current) use of injectable non-insulin antidiabetic drugs: Secondary | ICD-10-CM

## 2022-05-27 DIAGNOSIS — I10 Essential (primary) hypertension: Secondary | ICD-10-CM

## 2022-05-27 DIAGNOSIS — N181 Chronic kidney disease, stage 1: Secondary | ICD-10-CM

## 2022-05-27 DIAGNOSIS — E1122 Type 2 diabetes mellitus with diabetic chronic kidney disease: Secondary | ICD-10-CM | POA: Diagnosis not present

## 2022-05-27 NOTE — Patient Instructions (Signed)
Medication Instructions:  No changes  If you need a refill on your cardiac medications before your next appointment, please call your pharmacy.   Lab work: No new labs needed  Testing/Procedures: No new testing needed  Follow-Up: At [facility], you and your health needs are our priority.  As part of our continuing mission to provide you with exceptional heart care, we have created designated Provider Care Teams.  These Care Teams include your primary Cardiologist (physician) and Advanced Practice Providers (APPs -  Physician Assistants and Nurse Practitioners) who all work together to provide you with the care you need, when you need it.  You will need a follow up appointment in 12 months  Providers on your designated Care Team:   Christopher Berge, NP Ryan Dunn, PA-C Cadence Furth, PA-C  COVID-19 Vaccine Information can be found at: [email or web address] For questions related to vaccine distribution or appointments, please email [email or web address] or call [phone].   

## 2022-06-10 ENCOUNTER — Ambulatory Visit (INDEPENDENT_AMBULATORY_CARE_PROVIDER_SITE_OTHER): Payer: Medicare HMO

## 2022-06-10 VITALS — BP 118/70 | Ht 62.0 in | Wt 165.6 lb

## 2022-06-10 DIAGNOSIS — Z0000 Encounter for general adult medical examination without abnormal findings: Secondary | ICD-10-CM | POA: Diagnosis not present

## 2022-06-10 NOTE — Progress Notes (Signed)
Subjective:   Natalie Bray is a 88 y.o. female who presents for Medicare Annual (Subsequent) preventive examination.  Review of Systems         Objective:    Today's Vitals   06/10/22 0909  Weight: 165 lb 9.6 oz (75.1 kg)  Height: 5\' 2"  (1.575 m)   Body mass index is 30.29 kg/m.     06/10/2022    9:19 AM 02/21/2021    7:19 AM 02/13/2021   10:43 AM 12/08/2019   11:15 AM 06/23/2018    1:28 PM 01/31/2018   12:15 AM 08/28/2017    8:38 AM  Advanced Directives  Does Patient Have a Medical Advance Directive? Yes  Yes Yes No No No  Type of Estate agent of [place];Living will Healthcare Power of [employer] Power of [employer] Power of [place];Living will     Does patient want to make changes to medical advance directive?   No - Patient declined    No - Patient declined  Copy of Healthcare Power of Attorney in Chart?  No - copy requested No - copy requested No - copy requested     Would patient like information on creating a medical advance directive?     No - Patient declined      Current Medications (verified) Outpatient Encounter Medications as of 06/10/2022  Medication Sig   acetaminophen (TYLENOL) 500 MG tablet Take 1,000 mg by mouth every 6 (six) hours as needed for moderate pain.   amLODipine (NORVASC) 10 MG tablet TAKE 1 TABLET EVERY DAY   aspirin 81 MG chewable tablet Chew 81 mg by mouth daily.   calcium-vitamin D (OSCAL WITH D) 500-200 MG-UNIT tablet Take 1 tablet by mouth daily.   Cholecalciferol (VITAMIN D3) 50 MCG (2000 UT) TABS Take 1 tablet by mouth daily.   clobetasol ointment (TEMOVATE) 0.05 % Apply to affected area every night for 4 weeks, then every other day for 4 weeks and then twice a week for 4 weeks or until resolution.   cloNIDine (CATAPRES) 0.1 MG tablet TAKE 1 TABLET AS NEEDED FOR BLOOD PRESSURE OVER 180. MAY REPEAT IN 30 MINUTES. DO NOT TAKE MORE THAN 3 IN A DAY   docusate sodium (COLACE) 100 MG capsule Take 100 mg by  mouth daily.   FLUoxetine (PROZAC) 10 MG capsule TAKE 1 CAPSULE EVERY DAY (Patient not taking: Reported on 06/10/2022)   glipiZIDE (GLUCOTROL XL) 5 MG 24 hr tablet TAKE 1 TABLET EVERY DAY WITH BREAKFAST   HYDROcodone bit-homatropine (HYCODAN) 5-1.5 MG/5ML syrup Take 2.5-5 mLs by mouth every 8 (eight) hours as needed for cough. (Patient not taking: Reported on 06/10/2022)   lovastatin (MEVACOR) 40 MG tablet TAKE 1 TABLET EVERY DAY   metFORMIN (GLUCOPHAGE) 1000 MG tablet TAKE 1 TABLET TWICE DAILY   Omega-3 1000 MG CAPS Take 1,000 mg by mouth daily.   omeprazole (PRILOSEC) 20 MG capsule TAKE 1 CAPSULE EVERY DAY   spironolactone (ALDACTONE) 50 MG tablet TAKE 1 TABLET EVERY DAY (DOSE CHANGE)   traZODone (DESYREL) 100 MG tablet Take 0.5-1 tablets (50-100 mg total) by mouth at bedtime as needed for sleep. (Patient not taking: Reported on 06/10/2022)   TRUE METRIX BLOOD GLUCOSE TEST test strip TEST AS DIRECTED   TRUEplus Lancets 33G MISC TEST BLOOD SUGAR EVERY DAY   valsartan-hydrochlorothiazide (DIOVAN-HCT) 320-25 MG tablet TAKE 1 TABLET EVERY DAY   vitamin E 180 MG (400 UNITS) capsule Take 1 capsule by mouth daily.   No facility-administered encounter  medications on file as of 06/10/2022.    Allergies (verified) Betadine [povidone iodine], Celecoxib, and Povidone-iodine   History: Past Medical History:  Diagnosis Date   Anemia    Anemia, iron deficiency 08/08/2014   Anxiety    Diabetes mellitus without complication (HCC)    GERD (gastroesophageal reflux disease)    History of chicken pox    History of skin cancer    Hyperlipidemia    Hypertension    Skin cancer    Past Surgical History:  Procedure Laterality Date   ABDOMINAL HYSTERECTOMY     CARPAL TUNNEL RELEASE Right 02/21/2021   Procedure: CARPAL TUNNEL RELEASE ENDOSCOPIC;  Surgeon: Christena Flake, MD;  Location: [facility] ORS;  Service: Orthopedics;  Laterality: Right;   CATARACT EXTRACTION Bilateral    Prolapsed bladder  03/2013   Dr.  Ashley Royalty   REPLACEMENT TOTAL KNEE Left 01/2011   [facility]   REPLACEMENT TOTAL KNEE Right 03/2010   TOTAL ABDOMINAL HYSTERECTOMY W/ BILATERAL SALPINGOOPHORECTOMY  1974   BSO   Family History  Problem Relation Age of Onset   Kidney failure Mother    Aneurysm Father    Hypertension Sister    Diabetes Sister    Diabetes Brother    Hypertension Brother    Diabetes Sister    Hypertension Sister    Diabetes Brother    Hypertension Brother    Social History   Socioeconomic History   Marital status: Widowed    Spouse name: Not on file   Number of children: 2   Years of education: 40   Highest education level: 12th grade  Occupational History   Occupation: retired  Tobacco Use   Smoking status: Never   Smokeless tobacco: Never  Vaping Use   Vaping Use: Never used  Substance and Sexual Activity   Alcohol use: No    Alcohol/week: 0.0 standard drinks of alcohol   Drug use: No   Sexual activity: Not Currently  Other Topics Concern   Not on file  Social History Narrative   Not on file   Social Determinants of Health   Financial Resource Strain: Low Risk  (06/10/2022)   Overall Financial Resource Strain (CARDIA)    Difficulty of Paying Living Expenses: Not hard at all  Food Insecurity: No Food Insecurity (06/10/2022)   Hunger Vital Sign    Worried About Running Out of Food in the Last Year: Never true    Ran Out of Food in the Last Year: Never true  Transportation Needs: No Transportation Needs (06/10/2022)   PRAPARE - Administrator, Civil Service (Medical): No    Lack of Transportation (Non-Medical): No  Physical Activity: Inactive (06/10/2022)   Exercise Vital Sign    Days of Exercise per Week: 0 days    Minutes of Exercise per Session: 0 min  Stress: No Stress Concern Present (06/10/2022)   [employer] of Occupational Health - Occupational Stress Questionnaire    Feeling of Stress : Not at all  Social Connections: Moderately Isolated (06/10/2022)    Social Connection and Isolation Panel [NHANES]    Frequency of Communication with Friends and Family: More than three times a week    Frequency of Social Gatherings with Friends and Family: More than three times a week    Attends Religious Services: More than 4 times per year    Active Member of [employer] or Organizations: No    Attends Banker Meetings: Never    Marital Status: Widowed  Tobacco Counseling Counseling given: Not Answered   Clinical Intake:  Pre-visit preparation completed: Yes  Pain : No/denies pain     BMI - recorded: 30.29 Nutritional Status: BMI > 30  Obese Nutritional Risks: None Diabetes: Yes CBG done?: No Did pt. bring in CBG monitor from home?: No  How often do you need to have someone help you when you read instructions, pamphlets, or other written materials from your doctor or pharmacy?: 1 - Never  Diabetic?yes  Interpreter Needed?: No  Comments: lives alone Information entered by :: B.Raysha Tilmon,LPN   Activities of Daily Living    06/10/2022    9:19 AM 02/11/2022    9:29 AM  In your present state of health, do you have any difficulty performing the following activities:  Hearing? 0 0  Vision? 0 0  Difficulty concentrating or making decisions? 0 0  Walking or climbing stairs? 0 0  Dressing or bathing? 0 0  Doing errands, shopping? 0 0  Preparing Food and eating ? N   Using the Toilet? N   In the past six months, have you accidently leaked urine? Y   Do you have problems with loss of bowel control? N   Managing your Medications? N   Managing your Finances? N   Housekeeping or managing your Housekeeping? N     Patient Care Team: Malva Limes, MD as PCP - General (Family Medicine) Dasher, Cliffton Asters, MD (Dermatology) Isla Pence, OD (Optometry) [place], Annye Rusk, DPM as Consulting Physician (Podiatry) Poggi, Excell Seltzer, MD as Consulting Physician (Orthopedic Surgery) [facility], [facility] (Optometry)  Indicate any recent  Medical Services you may have received from other than [facility] providers in the past year (date may be approximate).     Assessment:   This is a routine wellness examination for Naliah.  Hearing/Vision screen Hearing Screening - Comments:: Adequate hearing Vision Screening - Comments:: Adequate vision [facility]  Dietary issues and exercise activities discussed:     Goals Addressed               This Visit's Progress     "I'm doing well I think" (pt-stated)   On track     Current Barriers:  High pill burden adherence  Pharmacist Clinical Goal(s):  Over the next 90 days, patient will continue to take all medications as prescribed, with adherence indicated by patient report and pharmacy fill history  Interventions: Comprehensive medication review Updated EMR medication list Emphasized importance of high blood pressure and cholesterol medications Counseled patient on checking BP and blood glucose at home using home monitors (available through [employer] OTC catalog)  Patient Self Care Activities:  Self administers medications as prescribed Attends all scheduled provider appointments Calls pharmacy for medication refills Calls provider office for new concerns or questions  Initial goal documentation       DIET - INCREASE WATER INTAKE   On track     Recommend to drink at least 6-8 8oz glasses of water per day.       Depression Screen    06/10/2022    9:15 AM 02/11/2022    9:29 AM 06/05/2021    9:04 AM 02/02/2021    8:52 AM 06/21/2020    8:36 AM 02/22/2020    8:36 AM 11/09/2019    1:11 PM  PHQ 2/9 Scores  PHQ - 2 Score 0 0 0 0 0 0 0  PHQ- 9 Score  0 1 0 0 0 0    Fall Risk  06/10/2022    9:11 AM 02/11/2022    9:29 AM 06/05/2021    9:04 AM 02/02/2021    8:52 AM 06/21/2020    8:35 AM  Fall Risk   Falls in the past year? 0 0 0 0 0  Number falls in past yr: 0 0 0 0 0  Injury with Fall? 0 0 0 0 0  Risk for fall due to : No Fall Risks  No Fall Risks     Follow up Education provided;Falls prevention discussed  Falls evaluation completed  Falls evaluation completed    FALL RISK PREVENTION PERTAINING TO THE HOME:  Any stairs in or around the home? No has ramp If so, are there any without handrails? No  Home free of loose throw rugs in walkways, pet beds, electrical cords, etc? Yes  Adequate lighting in your home to reduce risk of falls? Yes   ASSISTIVE DEVICES UTILIZED TO PREVENT FALLS:  Life alert? No  Use of a cane, walker or w/c? No  Grab bars in the bathroom? Yes  Shower chair or bench in shower? Yes has but does not use Elevated toilet seat or a handicapped toilet? Yes   TIMED UP AND GO:  Was the test performed? Yes .  Length of time to ambulate 10 feet: 10 sec.   Gait slow and steady without use of assistive device  Cognitive Function:        06/10/2022    9:20 AM 12/08/2019   11:19 AM  6CIT Screen  What Year? 0 points 0 points  What month? 0 points 0 points  What time? 0 points 0 points  Count back from 20 0 points 0 points  Months in reverse 0 points 0 points  Repeat phrase 0 points 2 points  Total Score 0 points 2 points    Immunizations Immunization History  Administered Date(s) Administered   Fluad Quad(high Dose 65+) 11/02/2018, 10/11/2019, 10/24/2020, 10/08/2021   Influenza, High Dose Seasonal PF 10/06/2014, 10/05/2015, 10/26/2016, 09/23/2017   PFIZER(Purple Top)SARS-COV-2 Vaccination 02/23/2019, 03/16/2019   Pneumococcal Conjugate-13 04/28/2013   Pneumococcal Polysaccharide-23 10/26/2011    TDAP status: Up to date  Flu Vaccine status: Up to date  Pneumococcal vaccine status: Up to date  Covid-19 vaccine status: Completed vaccines  Qualifies for Shingles Vaccine? Yes   Zostavax completed No   Shingrix Completed?: No.    Education has been provided regarding the importance of this vaccine. Patient has been advised to call insurance company to determine out of pocket expense if they have not yet  received this vaccine. Advised may also receive vaccine at local pharmacy or Health Dept. Verbalized acceptance and understanding.  Screening Tests Health Maintenance  Topic Date Due   DTaP/Tdap/Td (1 - Tdap) Never done   Zoster Vaccines- Shingrix (1 of 2) Never done   COVID-19 Vaccine (3 - 2023-24 season) 09/21/2021   Diabetic kidney evaluation - Urine ACR  06/06/2022   OPHTHALMOLOGY EXAM  09/09/2022 (Originally 04/19/2021)   INFLUENZA VACCINE  08/22/2022   HEMOGLOBIN A1C  11/12/2022   Diabetic kidney evaluation - eGFR measurement  02/12/2023   Medicare Annual Wellness (AWV)  06/10/2023   DEXA SCAN  08/02/2023   Pneumonia Vaccine 73+ Years old  Completed   HPV VACCINES  Aged Out    Health Maintenance  Health Maintenance Due  Topic Date Due   DTaP/Tdap/Td (1 - Tdap) Never done   Zoster Vaccines- Shingrix (1 of 2) Never done   COVID-19 Vaccine (3 - 2023-24 season)  09/21/2021   Diabetic kidney evaluation - Urine ACR  06/06/2022    Colorectal cancer screening: No longer required.   Mammogram status: No longer required due to age.  Bone Density status: Completed yes. Results reflect: Bone density results: OSTEOPOROSIS. Repeat every 5 years.  Lung Cancer Screening: (Low Dose CT Chest recommended if Age 68-80 years, 30 pack-year currently smoking OR have quit w/in 15years.) does not qualify.   Lung Cancer Screening Referral: n o  Additional Screening:  Hepatitis C Screening: does not qualify; Completed yes  Vision Screening: Recommended annual ophthalmology exams for early detection of glaucoma and other disorders of the eye. Is the patient up to date with their annual eye exam?  Yes  Who is the provider or what is the name of the office in which the patient attends annual eye exams? Dr Dellie Burns If pt is not established with a provider, would they like to be referred to a provider to establish care? No .   Dental Screening: Recommended annual dental exams for proper oral  hygiene  Community Resource Referral / Chronic Care Management: CRR required this visit?  No   CCM required this visit?  No      Plan:     I have personally reviewed and noted the following in the patient's chart:   Medical and social history Use of alcohol, tobacco or illicit drugs  Current medications and supplements including opioid prescriptions. Patient is not currently taking opioid prescriptions. Functional ability and status Nutritional status Physical activity Advanced directives List of other physicians Hospitalizations, surgeries, and ER visits in previous 12 months Vitals Screenings to include cognitive, depression, and falls Referrals and appointments  In addition, I have reviewed and discussed with patient certain preventive protocols, quality metrics, and best practice recommendations. A written personalized care plan for preventive services as well as general preventive health recommendations were provided to patient.     Sue Lush, LPN   1/61/0960   Nurse Notes: The patient states she is doing well and has no concerns or questions at this time.

## 2022-06-10 NOTE — Patient Instructions (Signed)
Natalie Bray , Thank you for taking time to come for your Medicare Wellness Visit. I appreciate your ongoing commitment to your health goals. Please review the following plan we discussed and let me know if I can assist you in the future.   These are the goals we discussed:  Goals       "I'm doing well I think" (pt-stated)      Current Barriers:  High pill burden adherence  Pharmacist Clinical Goal(s):  Over the next 90 days, patient will continue to take all medications as prescribed, with adherence indicated by patient report and pharmacy fill history  Interventions: Comprehensive medication review Updated EMR medication list Emphasized importance of high blood pressure and cholesterol medications Counseled patient on checking BP and blood glucose at home using home monitors (available through [facility] OTC catalog)  Patient Self Care Activities:  Self administers medications as prescribed Attends all scheduled provider appointments Calls pharmacy for medication refills Calls provider office for new concerns or questions  Initial goal documentation       DIET - INCREASE WATER INTAKE      Recommend to drink at least 6-8 8oz glasses of water per day.        This is a list of the screening recommended for you and due dates:  Health Maintenance  Topic Date Due   DTaP/Tdap/Td vaccine (1 - Tdap) Never done   Zoster (Shingles) Vaccine (1 of 2) Never done   COVID-19 Vaccine (3 - 2023-24 season) 09/21/2021   Yearly kidney health urinalysis for diabetes  06/06/2022   Eye exam for diabetics  09/09/2022*   Flu Shot  08/22/2022   Hemoglobin A1C  11/12/2022   Yearly kidney function blood test for diabetes  02/12/2023   Medicare Annual Wellness Visit  06/10/2023   DEXA scan (bone density measurement)  08/02/2023   Pneumonia Vaccine  Completed   HPV Vaccine  Aged Out  *Topic was postponed. The date shown is not the original due date.    Advanced directives: yes  Conditions/risks  identified: low falls risk  Next appointment: Follow up in one year for your annual wellness visit 06/11/2023 @8 :45am telephone   Preventive Care 65 Years and Older, Female Preventive care refers to lifestyle choices and visits with your health care provider that can promote health and wellness. What does preventive care include? A yearly physical exam. This is also called an annual well check. Dental exams once or twice a year. Routine eye exams. Ask your health care provider how often you should have your eyes checked. Personal lifestyle choices, including: Daily care of your teeth and gums. Regular physical activity. Eating a healthy diet. Avoiding tobacco and drug use. Limiting alcohol use. Practicing safe sex. Taking low-dose aspirin every day. Taking vitamin and mineral supplements as recommended by your health care provider. What happens during an annual well check? The services and screenings done by your health care provider during your annual well check will depend on your age, overall health, lifestyle risk factors, and family history of disease. Counseling  Your health care provider may ask you questions about your: Alcohol use. Tobacco use. Drug use. Emotional well-being. Home and relationship well-being. Sexual activity. Eating habits. History of falls. Memory and ability to understand (cognition). Work and work Astronomer. Reproductive health. Screening  You may have the following tests or measurements: Height, weight, and BMI. Blood pressure. Lipid and cholesterol levels. These may be checked every 5 years, or more frequently if you are over 50  years old. Skin check. Lung cancer screening. You may have this screening every year starting at age 39 if you have a 30-pack-year history of smoking and currently smoke or have quit within the past 15 years. Fecal occult blood test (FOBT) of the stool. You may have this test every year starting at age 75. Flexible  sigmoidoscopy or colonoscopy. You may have a sigmoidoscopy every 5 years or a colonoscopy every 10 years starting at age 52. Hepatitis C blood test. Hepatitis B blood test. Sexually transmitted disease (STD) testing. Diabetes screening. This is done by checking your blood sugar (glucose) after you have not eaten for a while (fasting). You may have this done every 1-3 years. Bone density scan. This is done to screen for osteoporosis. You may have this done starting at age 22. Mammogram. This may be done every 1-2 years. Talk to your health care provider about how often you should have regular mammograms. Talk with your health care provider about your test results, treatment options, and if necessary, the need for more tests. Vaccines  Your health care provider may recommend certain vaccines, such as: Influenza vaccine. This is recommended every year. Tetanus, diphtheria, and acellular pertussis (Tdap, Td) vaccine. You may need a Td booster every 10 years. Zoster vaccine. You may need this after age 63. Pneumococcal 13-valent conjugate (PCV13) vaccine. One dose is recommended after age 67. Pneumococcal polysaccharide (PPSV23) vaccine. One dose is recommended after age 32. Talk to your health care provider about which screenings and vaccines you need and how often you need them. This information is not intended to replace advice given to you by your health care provider. Make sure you discuss any questions you have with your health care provider. Document Released: 02/03/2015 Document Revised: 09/27/2015 Document Reviewed: 11/08/2014 Elsevier Interactive Patient Education  2017 [employer].  Fall Prevention in the Home Falls can cause injuries. They can happen to people of all ages. There are many things you can do to make your home safe and to help prevent falls. What can I do on the outside of my home? Regularly fix the edges of walkways and driveways and fix any cracks. Remove anything that  might make you trip as you walk through a door, such as a raised step or threshold. Trim any bushes or trees on the path to your home. Use bright outdoor lighting. Clear any walking paths of anything that might make someone trip, such as rocks or tools. Regularly check to see if handrails are loose or broken. Make sure that both sides of any steps have handrails. Any raised decks and porches should have guardrails on the edges. Have any leaves, snow, or ice cleared regularly. Use sand or salt on walking paths during winter. Clean up any spills in your garage right away. This includes oil or grease spills. What can I do in the bathroom? Use night lights. Install grab bars by the toilet and in the tub and shower. Do not use towel bars as grab bars. Use non-skid mats or decals in the tub or shower. If you need to sit down in the shower, use a plastic, non-slip stool. Keep the floor dry. Clean up any water that spills on the floor as soon as it happens. Remove soap buildup in the tub or shower regularly. Attach bath mats securely with double-sided non-slip rug tape. Do not have throw rugs and other things on the floor that can make you trip. What can I do in the bedroom? Use  night lights. Make sure that you have a light by your bed that is easy to reach. Do not use any sheets or blankets that are too big for your bed. They should not hang down onto the floor. Have a firm chair that has side arms. You can use this for support while you get dressed. Do not have throw rugs and other things on the floor that can make you trip. What can I do in the kitchen? Clean up any spills right away. Avoid walking on wet floors. Keep items that you use a lot in easy-to-reach places. If you need to reach something above you, use a strong step stool that has a grab bar. Keep electrical cords out of the way. Do not use floor polish or wax that makes floors slippery. If you must use wax, use non-skid floor  wax. Do not have throw rugs and other things on the floor that can make you trip. What can I do with my stairs? Do not leave any items on the stairs. Make sure that there are handrails on both sides of the stairs and use them. Fix handrails that are broken or loose. Make sure that handrails are as long as the stairways. Check any carpeting to make sure that it is firmly attached to the stairs. Fix any carpet that is loose or worn. Avoid having throw rugs at the top or bottom of the stairs. If you do have throw rugs, attach them to the floor with carpet tape. Make sure that you have a light switch at the top of the stairs and the bottom of the stairs. If you do not have them, ask someone to add them for you. What else can I do to help prevent falls? Wear shoes that: Do not have high heels. Have rubber bottoms. Are comfortable and fit you well. Are closed at the toe. Do not wear sandals. If you use a stepladder: Make sure that it is fully opened. Do not climb a closed stepladder. Make sure that both sides of the stepladder are locked into place. Ask someone to hold it for you, if possible. Clearly mark and make sure that you can see: Any grab bars or handrails. First and last steps. Where the edge of each step is. Use tools that help you move around (mobility aids) if they are needed. These include: Canes. Walkers. Scooters. Crutches. Turn on the lights when you go into a dark area. Replace any light bulbs as soon as they burn out. Set up your furniture so you have a clear path. Avoid moving your furniture around. If any of your floors are uneven, fix them. If there are any pets around you, be aware of where they are. Review your medicines with your doctor. Some medicines can make you feel dizzy. This can increase your chance of falling. Ask your doctor what other things that you can do to help prevent falls. This information is not intended to replace advice given to you by your  health care provider. Make sure you discuss any questions you have with your health care provider. Document Released: 11/03/2008 Document Revised: 06/15/2015 Document Reviewed: 02/11/2014 Elsevier Interactive Patient Education  2017 [employer].

## 2022-06-14 ENCOUNTER — Other Ambulatory Visit: Payer: Self-pay | Admitting: Family Medicine

## 2022-08-13 NOTE — Telephone Encounter (Signed)
No additional information needed.

## 2022-08-26 ENCOUNTER — Other Ambulatory Visit: Payer: Self-pay | Admitting: Family Medicine

## 2022-08-27 NOTE — Telephone Encounter (Signed)
Requested medication (s) are due for refill today:   Yes for all 3  Requested medication (s) are on the active medication list:   Yes for all 3  Future visit scheduled:   Yes 8/23 with Dr. Sherrie Mustache   Last ordered: Diovan-HCT 04/05/2022 #90, 1 refill;   Mevacor 07/30/2021 #90, 4 refills;   Metformin 06/21/2021 #180, 4 refills  Unable to refill because labs are due per protocol.   Provider to review for refills prior to upcoming appt.      Requested Prescriptions  Pending Prescriptions Disp Refills   valsartan-hydrochlorothiazide (DIOVAN-HCT) 320-25 MG tablet [Pharmacy Med Name: VALSARTAN/HYDROCHLOROTHIAZIDE 320-25 MG Tablet] 90 tablet 3    Sig: TAKE 1 TABLET EVERY DAY     Cardiovascular: ARB + Diuretic Combos Failed - 08/26/2022  2:40 AM      Failed - K in normal range and within 180 days    Potassium  Date Value Ref Range Status  02/11/2022 4.9 3.5 - 5.2 mmol/L Final  07/23/2011 3.3 (L) 3.5 - 5.1 mmol/L Final         Failed - Na in normal range and within 180 days    Sodium  Date Value Ref Range Status  02/11/2022 134 134 - 144 mmol/L Final  07/23/2011 145 136 - 145 mmol/L Final         Failed - Cr in normal range and within 180 days    Creatinine  Date Value Ref Range Status  07/23/2011 0.59 (L) 0.60 - 1.30 mg/dL Final   Creatinine, Ser  Date Value Ref Range Status  02/11/2022 0.96 0.57 - 1.00 mg/dL Final   Creatinine, POC  Date Value Ref Range Status  09/11/2015 n/a mg/dL Final         Failed - eGFR is 10 or above and within 180 days    EGFR (African American)  Date Value Ref Range Status  07/23/2011 >60  Final   GFR calc Af Amer  Date Value Ref Range Status  05/28/2019 69 >59 mL/min/1.73 Final    Comment:    **[facility] currently reports eGFR in compliance with the current**   recommendations of the [employer]. [facility] will   update reporting as new guidelines are published from the NKF-ASN   Task force.    EGFR (Non-African Amer.)  Date Value  Ref Range Status  07/23/2011 >60  Final    Comment:    eGFR values <64mL/min/1.73 m2 may be an indication of chronic kidney disease (CKD). Calculated eGFR is useful in patients with stable renal function. The eGFR calculation will not be reliable in acutely ill patients when serum creatinine is changing rapidly. It is not useful in  patients on dialysis. The eGFR calculation may not be applicable to patients at the low and high extremes of body sizes, pregnant women, and vegetarians.    GFR calc non Af Amer  Date Value Ref Range Status  05/28/2019 60 >59 mL/min/1.73 Final   eGFR  Date Value Ref Range Status  02/11/2022 58 (L) >59 mL/min/1.73 Final         Passed - Patient is not pregnant      Passed - Last BP in normal range    BP Readings from Last 1 Encounters:  06/10/22 118/70         Passed - Valid encounter within last 6 months    Recent Outpatient Visits           3 months ago Type 2 diabetes mellitus with  stage 1 chronic kidney disease, without long-term current use of insulin (HCC)   [facility] Malva [facility], [facility]   5 months ago Cough, unspecified type   [facility] Malva Limes, MD   6 months ago Type 2 diabetes mellitus with stage 1 chronic kidney disease, without long-term current use of insulin (HCC)   [place] [facility] Malva Limes, MD   10 months ago Primary hypertension   [place] [facility] Malva Limes, MD   10 months ago Primary hypertension   [place] [facility] Malva Limes, MD       Future Appointments             In 2 weeks Fisher, Demetrios Isaacs, MD [facility]             lovastatin (MEVACOR) 40 MG tablet [Pharmacy Med Name: LOVASTATIN 40 MG Tablet] 90 tablet 3    Sig: TAKE 1 TABLET EVERY DAY     Cardiovascular:  Antilipid - Statins 2 Failed - 08/26/2022  2:40 AM      Failed -  Lipid Panel in normal range within the last 12 months    Cholesterol, Total  Date Value Ref Range Status  06/05/2021 174 100 - 199 mg/dL Final   LDL Chol Calc (NIH)  Date Value Ref Range Status  06/05/2021 69 0 - 99 mg/dL Final   HDL  Date Value Ref Range Status  06/05/2021 53 >39 mg/dL Final   Triglycerides  Date Value Ref Range Status  06/05/2021 327 (H) 0 - 149 mg/dL Final         Passed - Cr in normal range and within 360 days    Creatinine  Date Value Ref Range Status  07/23/2011 0.59 (L) 0.60 - 1.30 mg/dL Final   Creatinine, Ser  Date Value Ref Range Status  02/11/2022 0.96 0.57 - 1.00 mg/dL Final   Creatinine, POC  Date Value Ref Range Status  09/11/2015 n/a mg/dL Final         Passed - Patient is not pregnant      Passed - Valid encounter within last 12 months    Recent Outpatient Visits           3 months ago Type 2 diabetes mellitus with stage 1 chronic kidney disease, without long-term current use of insulin (HCC)   [place] [facility] Malva L[facility], MD   5 months ago Cough, unspecified type   [facility] Malva Limes, MD   6 months ago Type 2 diabetes mellitus with stage 1 chronic kidney disease, without long-term current use of insulin (HCC)   [place] [facility] Malva Limes, MD   10 months ago Primary hypertension   [place] [facility] Malva Limes, MD   10 months ago Primary hypertension    [facility] Malva Limes, MD       Future Appointments             In 2 weeks Sherrie Mustache, Demetrios Isaacs, MD [facility]             metFORMIN (GLUCOPHAGE) 1000 MG tablet [Pharmacy Med Name: METFORMIN HYDROCHLORIDE 1000 MG Tablet] 180 tablet 3    Sig: TAKE 1 TABLET TWICE DAILY     Endocrinology:  Diabetes - Biguanides Failed - 08/26/2022  2:40 AM      Failed - eGFR in normal range and within 360 days     EGFR (African American)  Date Value Ref Range Status  07/23/2011 >60  Final   GFR calc Af Amer  Date Value Ref Range Status  05/28/2019 69 >59 mL/min/1.73 Final    Comment:    **[facility] currently reports eGFR in compliance with the current**   recommendations of the [employer]. [facility] will   update reporting as new guidelines are published from the NKF-ASN   Task force.    EGFR (Non-African Amer.)  Date Value Ref Range Status  07/23/2011 >60  Final    Comment:    eGFR values <32mL/min/1.73 m2 may be an indication of chronic kidney disease (CKD). Calculated eGFR is useful in patients with stable renal function. The eGFR calculation will not be reliable in acutely ill patients when serum creatinine is changing rapidly. It is not useful in  patients on dialysis. The eGFR calculation may not be applicable to patients at the low and high extremes of body sizes, pregnant women, and vegetarians.    GFR calc non Af Amer  Date Value Ref Range Status  05/28/2019 60 >59 mL/min/1.73 Final   eGFR  Date Value Ref Range Status  02/11/2022 58 (L) >59 mL/min/1.73 Final         Failed - B12 Level in normal range and within 720 days    Vitamin B-12  Date Value Ref Range Status  02/12/2016 337 232 - 1,245 pg/mL Final         Failed - CBC within normal limits and completed in the last 12 months    WBC  Date Value Ref Range Status  10/23/2021 6.5 3.4 - 10.8 x10E3/uL Final  06/23/2018 8.1 4.0 - 10.5 K/uL Final   RBC  Date Value Ref Range Status  10/23/2021 4.50 3.77 - 5.28 x10E6/uL Final  06/23/2018 4.38 3.87 - 5.11 MIL/uL Final   Hemoglobin  Date Value Ref Range Status  10/23/2021 13.3 11.1 - 15.9 g/dL Final   Hematocrit  Date Value Ref Range Status  10/23/2021 40.0 34.0 - 46.6 % Final   MCHC  Date Value Ref Range Status  10/23/2021 33.3 31.5 - 35.7 g/dL Final  52/84/1324 40.1 30.0 - 36.0 g/dL Final   [facility]  Date Value Ref Range Status  10/23/2021  29.6 26.6 - 33.0 pg Final  06/23/2018 27.4 26.0 - 34.0 pg Final   MCV  Date Value Ref Range Status  10/23/2021 89 79 - 97 fL Final  07/16/2011 84 80 - 100 fL Final   No results found for: "PLTCOUNTKUC", "LABPLAT", "POCPLA" RDW  Date Value Ref Range Status  10/23/2021 13.1 11.7 - 15.4 % Final  07/16/2011 16.6 (H) 11.5 - 14.5 % Final         Passed - Cr in normal range and within 360 days    Creatinine  Date Value Ref Range Status  07/23/2011 0.59 (L) 0.60 - 1.30 mg/dL Final   Creatinine, Ser  Date Value Ref Range Status  02/11/2022 0.96 0.57 - 1.00 mg/dL Final   Creatinine, POC  Date Value Ref Range Status  09/11/2015 n/a mg/dL Final         Passed - HBA1C is between 0 and 7.9 and within 180 days    Hemoglobin A1C  Date Value Ref Range Status  05/13/2022 6.8 (A) 4.0 - 5.6 % Final   Hgb A1c MFr Bld  Date Value Ref Range  Status  02/11/2022 6.9 (H) 4.8 - 5.6 % Final    Comment:             Prediabetes: 5.7 - 6.4          Diabetes: >6.4          Glycemic control for adults with diabetes: <7.0          Passed - Valid encounter within last 6 months    Recent Outpatient Visits           3 months ago Type 2 diabetes mellitus with stage 1 chronic kidney disease, without long-term current use of insulin (HCC)   [place] [facility] Malva Limes, MD   5 months ago Cough, unspecified type   [facility] Malva Limes, MD   6 months ago Type 2 diabetes mellitus with stage 1 chronic kidney disease, without long-term current use of insulin (HCC)   [place] [facility] Malva Limes, MD   10 months ago Primary hypertension   [place] [facility] Malva Limes, MD   10 months ago Primary hypertension   [place] [facility] Malva Limes, MD       Future Appointments             In 2 weeks Fisher, Demetrios Isaacs, MD [facility]

## 2022-08-31 ENCOUNTER — Other Ambulatory Visit: Payer: Self-pay | Admitting: Family Medicine

## 2022-08-31 DIAGNOSIS — F5104 Psychophysiologic insomnia: Secondary | ICD-10-CM

## 2022-08-31 DIAGNOSIS — F439 Reaction to severe stress, unspecified: Secondary | ICD-10-CM

## 2022-09-02 NOTE — Telephone Encounter (Signed)
Requested Prescriptions  Pending Prescriptions Disp Refills   omeprazole (PRILOSEC) 20 MG capsule [Pharmacy Med Name: OMEPRAZOLE 20 MG Capsule Delayed Release] 90 capsule 3    Sig: TAKE 1 CAPSULE EVERY DAY     Gastroenterology: Proton Pump Inhibitors Passed - 08/31/2022 10:15 AM      Passed - Valid encounter within last 12 months    Recent Outpatient Visits           3 months ago Type 2 diabetes mellitus with stage 1 chronic kidney disease, without long-term current use of insulin (HCC)   [facility] Malva Limes, MD   5 months ago Cough, unspecified type   [facility] Malva Limes, MD   6 months ago Type 2 diabetes mellitus with stage 1 chronic kidney disease, without long-term current use of insulin (HCC)   [place] [facility] Malva Limes, MD   10 months ago Primary hypertension   [place] [facility] Malva Limes, MD   10 months ago Primary hypertension   [place] [facility] Malva Limes, MD       Future Appointments             In 1 week Fisher, Demetrios Isaacs, MD [facility]             FLUoxetine (PROZAC) 10 MG capsule [Pharmacy Med Name: FLUOXETINE HYDROCHLORIDE 10 MG Capsule] 90 capsule 3    Sig: TAKE 1 CAPSULE EVERY DAY     Psychiatry:  Antidepressants - SSRI Passed - 08/31/2022 10:15 AM      Passed - Valid encounter within last 6 months    Recent Outpatient Visits           3 months ago Type 2 diabetes mellitus with stage 1 chronic kidney disease, without long-term current use of insulin (HCC)   [place] [facility] Malva Limes, MD   5 months ago Cough, unspecified type   [facility] Mal[facility], [facility]   6 months ago Type 2 diabetes mellitus with stage 1 chronic kidney disease, without long-term current use of insulin (HCC)   [place] [facility] Malva Limes, MD   10 months ago Primary hypertension   [place] [facility] Malva Limes, MD   10 months ago Primary hypertension   [place] [facility] Sherrie Mustache, Demetrios Isaacs, MD       Future Appointments             In 1 week Fisher, Demetrios Isaacs, MD  [facility]             amLODipine (NORVASC) 10 MG tablet [Pharmacy Med Name: AMLODIPINE BESYLATE 10 MG Tablet] 90 tablet 3    Sig: TAKE 1 TABLET EVERY DAY     Cardiovascular: Calcium Channel Blockers 2 Passed - 08/31/2022 10:15 AM      Passed - Last BP in normal range    BP Readings from Last 1 Encounters:  06/10/22 118/70         Passed - Last Heart Rate in normal range    Pulse Readings from Last 1 Encounters:  05/27/22 (!) 102         Passed - Valid encounter within last 6 months    Recent Outpatient Visits           3 months  ago Type 2 diabetes mellitus with stage 1 chronic kidney disease, without long-term current use of insulin (HCC)   [place] [facility] Malva Limes, MD   5 months ago Cough, unspecified type   [facility], [facility]   6 months ago Type 2 diabetes mellitus with stage 1 chronic kidney disease, without long-term current use of insulin (HCC)   [place] [facility] Malva Limes, MD   10 months ago Primary hypertension   [place] [facility] Malva Limes, MD   10 months ago Primary hypertension   [place] [facility] Malva Limes, MD       Future Appointments             In 1 week Fisher, Demetrios Isaacs, MD [facility]

## 2022-09-09 DIAGNOSIS — D225 Melanocytic nevi of trunk: Secondary | ICD-10-CM | POA: Diagnosis not present

## 2022-09-09 DIAGNOSIS — D2262 Melanocytic nevi of left upper limb, including shoulder: Secondary | ICD-10-CM | POA: Diagnosis not present

## 2022-09-09 DIAGNOSIS — Z85828 Personal history of other malignant neoplasm of skin: Secondary | ICD-10-CM | POA: Diagnosis not present

## 2022-09-09 DIAGNOSIS — L820 Inflamed seborrheic keratosis: Secondary | ICD-10-CM | POA: Diagnosis not present

## 2022-09-09 DIAGNOSIS — D485 Neoplasm of uncertain behavior of skin: Secondary | ICD-10-CM | POA: Diagnosis not present

## 2022-09-09 DIAGNOSIS — Z961 Presence of intraocular lens: Secondary | ICD-10-CM | POA: Diagnosis not present

## 2022-09-09 DIAGNOSIS — E119 Type 2 diabetes mellitus without complications: Secondary | ICD-10-CM | POA: Diagnosis not present

## 2022-09-09 DIAGNOSIS — Z0100 Encounter for examination of eyes and vision without abnormal findings: Secondary | ICD-10-CM | POA: Diagnosis not present

## 2022-09-09 DIAGNOSIS — D2271 Melanocytic nevi of right lower limb, including hip: Secondary | ICD-10-CM | POA: Diagnosis not present

## 2022-09-09 DIAGNOSIS — D2261 Melanocytic nevi of right upper limb, including shoulder: Secondary | ICD-10-CM | POA: Diagnosis not present

## 2022-09-09 DIAGNOSIS — L538 Other specified erythematous conditions: Secondary | ICD-10-CM | POA: Diagnosis not present

## 2022-09-09 DIAGNOSIS — D0462 Carcinoma in situ of skin of left upper limb, including shoulder: Secondary | ICD-10-CM | POA: Diagnosis not present

## 2022-09-09 DIAGNOSIS — L570 Actinic keratosis: Secondary | ICD-10-CM | POA: Diagnosis not present

## 2022-09-09 DIAGNOSIS — D044 Carcinoma in situ of skin of scalp and neck: Secondary | ICD-10-CM | POA: Diagnosis not present

## 2022-09-09 DIAGNOSIS — D2272 Melanocytic nevi of left lower limb, including hip: Secondary | ICD-10-CM | POA: Diagnosis not present

## 2022-09-09 LAB — HM DIABETES EYE EXAM

## 2022-09-10 ENCOUNTER — Encounter: Payer: Self-pay | Admitting: Family Medicine

## 2022-09-13 ENCOUNTER — Ambulatory Visit (INDEPENDENT_AMBULATORY_CARE_PROVIDER_SITE_OTHER): Payer: Medicare HMO | Admitting: Family Medicine

## 2022-09-13 VITALS — BP 136/64 | HR 73 | Temp 97.7°F | Ht 62.0 in | Wt 163.0 lb

## 2022-09-13 DIAGNOSIS — N181 Chronic kidney disease, stage 1: Secondary | ICD-10-CM | POA: Diagnosis not present

## 2022-09-13 DIAGNOSIS — M810 Age-related osteoporosis without current pathological fracture: Secondary | ICD-10-CM | POA: Diagnosis not present

## 2022-09-13 DIAGNOSIS — H6121 Impacted cerumen, right ear: Secondary | ICD-10-CM | POA: Diagnosis not present

## 2022-09-13 DIAGNOSIS — Z0001 Encounter for general adult medical examination with abnormal findings: Secondary | ICD-10-CM

## 2022-09-13 DIAGNOSIS — E1122 Type 2 diabetes mellitus with diabetic chronic kidney disease: Secondary | ICD-10-CM

## 2022-09-13 DIAGNOSIS — R011 Cardiac murmur, unspecified: Secondary | ICD-10-CM | POA: Insufficient documentation

## 2022-09-13 DIAGNOSIS — R809 Proteinuria, unspecified: Secondary | ICD-10-CM | POA: Diagnosis not present

## 2022-09-13 DIAGNOSIS — Z0000 Encounter for general adult medical examination without abnormal findings: Secondary | ICD-10-CM

## 2022-09-13 DIAGNOSIS — E782 Mixed hyperlipidemia: Secondary | ICD-10-CM

## 2022-09-13 DIAGNOSIS — I10 Essential (primary) hypertension: Secondary | ICD-10-CM

## 2022-09-13 NOTE — Progress Notes (Signed)
Complete physical exam   Patient: Natalie Bray   DOB: [birth year]   88 y.o. Female  MRN: [id number] Visit Date: 09/13/2022  Today's healthcare provider: Mila Merry, MD   Chief Complaint  Patient presents with   Annual Exam   Diabetes   Hypertension   Hyperlipidemia   Subjective    Discussed the use of AI scribe software for clinical note transcription with the patient, who gave verbal consent to proceed.  History of Present Illness   The patient, with a history of diabetes and vitamin B12 deficiency, presents for a routine physical and blood work. She has been monitoring her blood sugar levels at home daily and report that the readings have been satisfactory. She is currently on metformin for diabetes management and have not experienced any issues with the medication. However, she express concern about the surplus of medication she have received from their pharmacy, including metformin and Klonopin, the latter of which she rarely use.   She have a supply of a medication for anxiety, which she use sparingly and only when necessary. She attribute her reduced need for these medications to an improvement in her emotional state following the passing of a loved one.  The patient reports regular monitoring of her blood pressure at home, with readings often around 150, but occasionally dropping to around 120. She deny any symptoms of dizziness or lightheadedness. She describe herself as fairly active, mainly doing housework.  The patient also express curiosity about her iron levels, as she have heard other older individuals discussing this. She deny any known issues with her iron levels or blood counts in the past. She also report a recent visit to the dermatologist and an eye doctor, with no significant findings.  Lastly, the patient mentions a concern about her ears, which have been itching. She deny any hearing issues but note that there is a significant amount of wax in her right  ear. She express interest in using ear drops to alleviate this issue.        Past Medical History:  Diagnosis Date   Anemia    Anemia, iron deficiency 08/08/2014   Anxiety    Diabetes mellitus without complication (HCC)    GERD (gastroesophageal reflux disease)    History of chicken pox    History of skin cancer    Hyperlipidemia    Hypertension    Skin cancer    Past Surgical History:  Procedure Laterality Date   ABDOMINAL HYSTERECTOMY     CARPAL TUNNEL RELEASE Right 02/21/2021   Procedure: CARPAL TUNNEL RELEASE ENDOSCOPIC;  Surgeon: Christena Flake, MD;  Location: [facility] ORS;  Service: Orthopedics;  Laterality: Right;   CATARACT EXTRACTION Bilateral    Prolapsed bladder  03/2013   Dr. Ashley Royalty   REPLACEMENT TOTAL KNEE Left 01/2011   [facility]   REPLACEMENT TOTAL KNEE Right 03/2010   TOTAL ABDOMINAL HYSTERECTOMY W/ BILATERAL SALPINGOOPHORECTOMY  1974   [facility]   Social History   Socioeconomic History   Marital status: Widowed    Spouse name: Not on file   Number of children: 2   Years of education: 70   Highest education level: 12th grade  Occupational History   Occupation: retired  Tobacco Use   Smoking status: Never   Smokeless tobacco: Never  Vaping Use   Vaping status: Never Used  Substance and Sexual Activity   Alcohol use: No    Alcohol/week: 0.0 standard drinks of alcohol   Drug use: No  Sexual activity: Not Currently  Other Topics Concern   Not on file  Social History Narrative   Not on file   Social Determinants of Health   Financial Resource Strain: Low Risk  (06/10/2022)   Overall Financial Resource Strain (CARDIA)    Difficulty of Paying Living Expenses: Not hard at all  Food Insecurity: No Food Insecurity (06/10/2022)   Hunger Vital Sign    Worried About Running Out of Food in the Last Year: Never true    Ran Out of Food in the Last Year: Never true  Transportation Needs: No Transportation Needs (06/10/2022)   PRAPARE - Scientist, research (physical sciences) (Medical): No    Lack of Transportation (Non-Medical): No  Physical Activity: Inactive (06/10/2022)   Exercise Vital Sign    Days of Exercise per Week: 0 days    Minutes of Exercise per Session: 0 min  Stress: No Stress Concern Present (06/10/2022)   [employer] of Occupational Health - Occupational Stress Questionnaire    Feeling of Stress : Not at all  Social Connections: Moderately Isolated (06/10/2022)   Social Connection and Isolation Panel [NHANES]    Frequency of Communication with Friends and Family: More than three times a week    Frequency of Social Gatherings with Friends and Family: More than three times a week    Attends Religious Services: More than 4 times per year    Active Member of [employer] or Organizations: No    Attends Banker Meetings: Never    Marital Status: Widowed  Intimate Partner Violence: Not At Risk (06/10/2022)   Humiliation, Afraid, Rape, and Kick questionnaire    Fear of Current or Ex-Partner: No    Emotionally Abused: No    Physically Abused: No    Sexually Abused: No   Family Status  Relation Name Status   Mother  Deceased at age 90       Cause of Death: Kidney failure   Father  Deceased at age 73       Cause of Death: Ruptured Aneurysm   Sister  Nature conservation officer   Daughter  Alive   Daughter  Alive   Sister  Alive   Brother  (Not Specified)  No partnership data on file   Family History  Problem Relation Age of Onset   Kidney failure Mother    Aneurysm Father    Hypertension Sister    Diabetes Sister    Diabetes Brother    Hypertension Brother    Diabetes Sister    Hypertension Sister    Diabetes Brother    Hypertension Brother    Allergies  Allergen Reactions   Alendronate Itching    Headaches   Betadine [Povidone Iodine] Rash   Celecoxib Rash and Other (See Comments)    Flushed red   Povidone-Iodine Rash    Patient Care Team: Malva Limes, MD as PCP - General (Family  Medicine) Dasher, Cliffton Asters, MD (Dermatology) Isla Pence, OD (Optometry) Faison, Max T, DPM as Consulting Physician (Podiatry) Poggi, Excell Seltzer, MD as Consulting Physician (Orthopedic Surgery) Pa, [facility] (Optometry)   Medications: Outpatient Medications Prior to Visit  Medication Sig   acetaminophen (TYLENOL) 500 MG tablet Take 1,000 mg by mouth every 6 (six) hours as needed for moderate pain.   amLODipine (NORVASC) 10 MG tablet TAKE 1 TABLET EVERY DAY   aspirin 81 MG chewable tablet Chew 81 mg by mouth daily.   calcium-vitamin  D (OSCAL WITH D) 500-200 MG-UNIT tablet Take 1 tablet by mouth daily.   Cholecalciferol (VITAMIN D3) 50 MCG (2000 UT) TABS Take 1 tablet by mouth daily.   clobetasol ointment (TEMOVATE) 0.05 % Apply to affected area every night for 4 weeks, then every other day for 4 weeks and then twice a week for 4 weeks or until resolution.   cloNIDine (CATAPRES) 0.1 MG tablet TAKE 1 TABLET AS NEEDED FOR BLOOD PRESSURE OVER 180. MAY REPEAT IN 30 MINUTES. DO NOT TAKE MORE THAN 3 IN A DAY   docusate sodium (COLACE) 100 MG capsule Take 100 mg by mouth daily.   FLUoxetine (PROZAC) 10 MG capsule TAKE 1 CAPSULE EVERY DAY   glipiZIDE (GLUCOTROL XL) 5 MG 24 hr tablet TAKE 1 TABLET EVERY DAY WITH BREAKFAST   lovastatin (MEVACOR) 40 MG tablet TAKE 1 TABLET EVERY DAY   metFORMIN (GLUCOPHAGE) 1000 MG tablet TAKE 1 TABLET TWICE DAILY   Omega-3 1000 MG CAPS Take 1,000 mg by mouth daily.   omeprazole (PRILOSEC) 20 MG capsule TAKE 1 CAPSULE EVERY DAY   spironolactone (ALDACTONE) 50 MG tablet TAKE 1 TABLET EVERY DAY (DOSE CHANGE)   TRUE METRIX BLOOD GLUCOSE TEST test strip TEST AS DIRECTED   TRUEplus Lancets 33G MISC TEST BLOOD SUGAR EVERY DAY   valsartan-hydrochlorothiazide (DIOVAN-HCT) 320-25 MG tablet TAKE 1 TABLET EVERY DAY   vitamin E 180 MG (400 UNITS) capsule Take 1 capsule by mouth daily.   [DISCONTINUED] HYDROcodone bit-homatropine (HYCODAN) 5-1.5 MG/5ML syrup Take 2.5-5 mLs  by mouth every 8 (eight) hours as needed for cough. (Patient not taking: Reported on 06/10/2022)   [DISCONTINUED] traZODone (DESYREL) 100 MG tablet Take 0.5-1 tablets (50-100 mg total) by mouth at bedtime as needed for sleep. (Patient not taking: Reported on 06/10/2022)   No facility-administered medications prior to visit.    Objective    BP 136/64   Pulse 73   Temp 97.7 F (36.5 C)   Ht 5\' 2"  (1.575 m)   Wt 163 lb (73.9 kg)   SpO2 100%   BMI 29.81 kg/m   Physical Exam   General Appearance:    Well developed, well nourished female. Alert, cooperative, in no acute distress, appears stated age   Head:    Normocephalic, without obvious abnormality, atraumatic  Eyes:    PERRL, conjunctiva/corneas clear, EOM's intact, fundi    benign, both eyes  Ears:    Excessive cerumen on right, left canal and TM clear.   Nose:   Nares normal, septum midline, mucosa normal, no drainage    or sinus tenderness  Throat:   Lips, mucosa, and tongue normal; teeth and gums normal  Neck:   Supple, symmetrical, trachea midline, no adenopathy;    thyroid:  no enlargement/tenderness/nodules; no carotid   bruit or JVD  Back:     Symmetric, no curvature, ROM normal, no CVA tenderness  Lungs:     Clear to auscultation bilaterally, respirations unlabored  Chest Wall:    No tenderness or deformity   Heart:    Normal heart rate. 2/6 systolic murmur RUSB. No rubs, or gallops.   Breast Exam:    deferred  Abdomen:     Soft, non-tender, bowel sounds active all four quadrants,    no masses, no organomegaly  Pelvic:    deferred  Extremities:   All extremities are intact. No cyanosis or edema  Pulses:   2+ and symmetric all extremities  Skin:   Skin color, texture, turgor normal, no rashes  or lesions  Lymph nodes:   Cervical, supraclavicular, and axillary nodes normal  Neurologic:   CNII-XII intact, normal strength, sensation and reflexes    throughout     Last depression screening scores    09/13/2022    9:12  AM 06/10/2022    9:15 AM 02/11/2022    9:29 AM  PHQ 2/9 Scores  PHQ - 2 Score 0 0 0  PHQ- 9 Score   0   Last fall risk screening    06/10/2022    9:11 AM  Fall Risk   Falls in the past year? 0  Number falls in past yr: 0  Injury with Fall? 0  Risk for fall due to : No Fall Risks  Follow up Education provided;Falls prevention discussed   Last Audit-C alcohol use screening    06/10/2022    9:15 AM  Alcohol Use Disorder Test (AUDIT)  1. How often do you have a drink containing alcohol? 0  2. How many drinks containing alcohol do you have on a typical day when you are drinking? 0  3. How often do you have six or more drinks on one occasion? 0  AUDIT-C Score 0   A score of 3 or more in women, and 4 or more in men indicates increased risk for alcohol abuse, EXCEPT if all of the points are from question 1   No results found for any visits on 09/13/22.  Assessment & Plan    Routine Health Maintenance and Physical Exam  Exercise Activities and Dietary recommendations  Goals       "I'm doing well I think" (pt-stated)      Current Barriers:  High pill burden adherence  Pharmacist Clinical Goal(s):  Over the next 90 days, patient will continue to take all medications as prescribed, with adherence indicated by patient report and pharmacy fill history  Interventions: Comprehensive medication review Updated EMR medication list Emphasized importance of high blood pressure and cholesterol medications Counseled patient on checking BP and blood glucose at home using home monitors (available through [facility] OTC catalog)  Patient Self Care Activities:  Self administers medications as prescribed Attends all scheduled provider appointments Calls pharmacy for medication refills Calls provider office for new concerns or questions  Initial goal documentation       DIET - INCREASE WATER INTAKE      Recommend to drink at least 6-8 8oz glasses of water per day.        Immunization  History  Administered Date(s) Administered   Fluad Quad(high Dose 65+) 11/02/2018, 10/11/2019, 10/24/2020, 10/08/2021   Influenza, High Dose Seasonal PF 10/06/2014, 10/05/2015, 10/26/2016, 09/23/2017   PFIZER(Purple Top)SARS-COV-2 Vaccination 02/23/2019, 03/16/2019   Pneumococcal Conjugate-13 04/28/2013   Pneumococcal Polysaccharide-23 10/26/2011    Health Maintenance  Topic Date Due   DTaP/Tdap/Td (1 - Tdap) Never done   Zoster Vaccines- Shingrix (1 of 2) Never done   COVID-19 Vaccine (3 - 2023-24 season) 09/21/2021   INFLUENZA VACCINE  08/22/2022   Diabetic kidney evaluation - Urine ACR  10/04/2022 (Originally 06/06/2022)   HEMOGLOBIN A1C  11/12/2022   Diabetic kidney evaluation - eGFR measurement  02/12/2023   Medicare Annual Wellness (AWV)  06/10/2023   DEXA SCAN  08/02/2023   OPHTHALMOLOGY EXAM  09/09/2023   Pneumonia Vaccine 22+ Years old  Completed   HPV VACCINES  Aged Out    Discussed health benefits of physical activity, and encouraged her to engage in regular exercise appropriate for her age and condition.  Assessment  and Plan    Diabetes Mellitus Daily home glucose monitoring with satisfactory results. Currently on Metformin with no reported side effects. Excess supply of medication due to frequent delivery from [facility]. -Continue Metformin as prescribed. -Check A1c and B12 levels.  Hypertension Blood pressure slightly elevated in office, but patient reports typical readings around 150 at home. No symptoms of dizziness or lightheadedness. -Continue current antihypertensive regimen. -Check kidney function.  Iron Status Patient inquired about iron levels due to discussions with peers. No known history of iron deficiency or anemia. -Check complete blood count to assess for any signs of anemia.  Ear Health Patient reports frequent itching in ears. Right ear observed to have significant wax build-up. -Recommend over-the-counter Debrox drops to help dissolve and rinse  out ear wax.  Influenza Vaccination Patient due for annual flu shot. -Schedule for flu shot in early October.  Follow-up -Return in three months for diabetes follow-up. -Check cholesterol levels at next visit.     Continue vitamin D supplement for osteoporosis. Intolerant to alendronate. BMD in 2025        Mila Merry, MD  [facility] [phone] (phone) [phone] (fax)  [facility]

## 2022-09-13 NOTE — Patient Instructions (Signed)
Please review the attached list of medications and notify my office if there are any errors.   Try over the counter Debrox ear drops to clear the wax out of your right ear canal

## 2022-09-14 LAB — COMPREHENSIVE METABOLIC PANEL
ALT: 15 IU/L (ref 0–32)
AST: 19 IU/L (ref 0–40)
Albumin: 4.7 g/dL (ref 3.7–4.7)
Alkaline Phosphatase: 90 IU/L (ref 44–121)
BUN/Creatinine Ratio: 17 (ref 12–28)
BUN: 16 mg/dL (ref 8–27)
Bilirubin Total: 0.3 mg/dL (ref 0.0–1.2)
CO2: 22 mmol/L (ref 20–29)
Calcium: 10.7 mg/dL — ABNORMAL HIGH (ref 8.7–10.3)
Chloride: 97 mmol/L (ref 96–106)
Creatinine, Ser: 0.94 mg/dL (ref 0.57–1.00)
Globulin, Total: 2.6 g/dL (ref 1.5–4.5)
Glucose: 136 mg/dL — ABNORMAL HIGH (ref 70–99)
Potassium: 4.4 mmol/L (ref 3.5–5.2)
Sodium: 133 mmol/L — ABNORMAL LOW (ref 134–144)
Total Protein: 7.3 g/dL (ref 6.0–8.5)
eGFR: 59 mL/min/{1.73_m2} — ABNORMAL LOW (ref >59)

## 2022-09-14 LAB — LIPID PANEL
Chol/HDL Ratio: 2.9 ratio (ref 0.0–4.4)
Cholesterol, Total: 180 mg/dL (ref 100–199)
HDL: 63 mg/dL (ref >39)
LDL Chol Calc (NIH): 83 mg/dL (ref 0–99)
Triglycerides: 208 mg/dL — ABNORMAL HIGH (ref 0–149)
VLDL Cholesterol Cal: 34 mg/dL (ref 5–40)

## 2022-09-14 LAB — CBC
Hematocrit: 38.6 % (ref 34.0–46.6)
Hemoglobin: 12.7 g/dL (ref 11.1–15.9)
MCH: 28.9 pg (ref 26.6–33.0)
MCHC: 32.9 g/dL (ref 31.5–35.7)
MCV: 88 fL (ref 79–97)
Platelets: 376 10*3/uL (ref 150–450)
RBC: 4.39 x10E6/uL (ref 3.77–5.28)
RDW: 13.4 % (ref 11.7–15.4)
WBC: 6.9 10*3/uL (ref 3.4–10.8)

## 2022-09-14 LAB — HEMOGLOBIN A1C
Est. average glucose Bld gHb Est-mCnc: 154 mg/dL
Hgb A1c MFr Bld: 7 % — ABNORMAL HIGH (ref 4.8–5.6)

## 2022-10-07 ENCOUNTER — Ambulatory Visit: Payer: Self-pay

## 2022-10-07 NOTE — Telephone Encounter (Signed)
Patient's daughter called, left VM to return the call to the office to speak to the NT.    Summary: Mental health concerns   Pt's daughter called regarding some confidential concerns about the patient's mental health, wants to speak to the clinic to determine how to move forward. (Confidential, pt must not know)  Best contact: [phone]  The patient is showing signs of mental health concerns, pt is forgetful, repeats things, and is having trouble communicating more often.

## 2022-10-07 NOTE — Telephone Encounter (Signed)
Chief Complaint: Dementia Questions Symptoms: Confusion and memory loss Frequency: comes and goes   Disposition: [] ED /[] Urgent Care (no appt availability in office) / [] Appointment(In office/virtual)/ []  [facility]/ [x] Home Care/ [] Refused Recommended Disposition /[] [facility]/ []  Follow-up with PCP Additional Notes: Patient's daughter Gillis Ends called to voice some concerns about the patient's memory. Sybil stated that the patient is sometimes forgetful and when the family points it out to the patient becomes defensive. Patient tells her family that her PCP checks her memory at her office visit and was told that her memory is fine. Sybil stats that the patient can not recall what she did the day before or continues to ask the same questions back to back. Sybil just wanted to make PCP aware and ask if he can do memory checks with patient at next visit. Gillis Ends is not sure if it is a memory issue or this is natural due to patient's age. Care advice was given and advised Sybil that I would forward her comments to PCP to make them aware of her concerns.  Summary: Mental health concerns   Pt's daughter called regarding some confidential concerns about the patient's mental health, wants to speak to the clinic to determine how to move forward. (Confidential, pt must not know)  Best contact: [phone]  The patient is showing signs of mental health concerns, pt is forgetful, repeats things, and is having trouble communicating more often.     Reason for Disposition  Dementia, general questions about  Answer Assessment - Initial Assessment Questions 1. MAIN CONCERN OR SYMPTOM:  "What is your main concern right now?" "What questions do you have?" "What's the main symptom you're worried about?" (e.g., confusion, memory loss)     Memory loss and confusion 2. ONSET:  "When did the symptom start (or worsen)?" (minutes, hours, days, weeks)     Around 2021  3. BETTER-SAME-WORSE: "Are you  (the patient) getting better, staying the same, or getting worse compared to the day you (they) were diagnosed or most recent hospital discharge ?"     Worse  4. DIAGNOSIS: "Was the dementia diagnosed by a doctor?" If Yes, ask: "When?" (e.g., days, months, years ago)     No  5. MEDICINES: "Has there been any change in medicines recently?" (e.g., narcotics, antihistamines, benzodiazepines, etc.)     Not that I know of  6. OTHER SYMPTOMS: "Are there any other symptoms?" (e.g., fever, cough, pain, falling)     Repeating herself multiple times 7. SUPPORT: Document living circumstances and support (e.g., family, nursing home)     Live alone  Protocols used: Dementia Symptoms and Questions-A-AH

## 2022-10-09 ENCOUNTER — Ambulatory Visit (INDEPENDENT_AMBULATORY_CARE_PROVIDER_SITE_OTHER): Payer: Medicare HMO

## 2022-10-09 DIAGNOSIS — Z23 Encounter for immunization: Secondary | ICD-10-CM

## 2022-10-14 DIAGNOSIS — L570 Actinic keratosis: Secondary | ICD-10-CM | POA: Diagnosis not present

## 2022-10-14 DIAGNOSIS — D044 Carcinoma in situ of skin of scalp and neck: Secondary | ICD-10-CM | POA: Diagnosis not present

## 2022-10-17 ENCOUNTER — Other Ambulatory Visit: Payer: Self-pay | Admitting: Family Medicine

## 2022-10-17 NOTE — Telephone Encounter (Signed)
Requested Prescriptions  Pending Prescriptions Disp Refills   glipiZIDE (GLUCOTROL XL) 5 MG 24 hr tablet [Pharmacy Med Name: glipiZIDE ER Oral Tablet Extended Release 24 Hour 5 MG] 90 tablet 3    Sig: TAKE 1 TABLET EVERY DAY WITH BREAKFAST     Endocrinology:  Diabetes - Sulfonylureas Passed - 10/17/2022 11:14 AM      Passed - HBA1C is between 0 and 7.9 and within 180 days    Hgb A1c MFr Bld  Date Value Ref Range Status  09/13/2022 7.0 (H) 4.8 - 5.6 % Final    Comment:             Prediabetes: 5.7 - 6.4          Diabetes: >6.4          Glycemic control for adults with diabetes: <7.0          Passed - Cr in normal range and within 360 days    Creatinine  Date Value Ref Range Status  07/23/2011 0.59 (L) 0.60 - 1.30 mg/dL Final   Creatinine, Ser  Date Value Ref Range Status  09/13/2022 0.94 0.57 - 1.00 mg/dL Final   Creatinine, POC  Date Value Ref Range Status  09/11/2015 n/a mg/dL Final         Passed - Valid encounter within last 6 months    Recent Outpatient Visits           1 month ago Annual physical exam   [facility] Malva Limes, MD   5 months ago Type 2 diabetes mellitus with stage 1 chronic kidney disease, without long-term current use of insulin (HCC)   [place] [facility] Malva Limes, MD   6 months ago Cough, unspecified type   [facility] Malva Limes, MD   8 months ago Type 2 diabetes mellitus with stage 1 chronic kidney disease, without long-term current use of insulin (HCC)   [facility] Malva Limes, MD   11 months ago Primary hypertension   [facility] Malva Limes, MD       Future Appointments             In 1 month Fisher, Demetrios Isaacs, MD [facility]

## 2022-10-21 DIAGNOSIS — D0462 Carcinoma in situ of skin of left upper limb, including shoulder: Secondary | ICD-10-CM | POA: Diagnosis not present

## 2022-12-05 ENCOUNTER — Other Ambulatory Visit: Payer: Self-pay | Admitting: Family Medicine

## 2022-12-05 DIAGNOSIS — I10 Essential (primary) hypertension: Secondary | ICD-10-CM

## 2022-12-05 NOTE — Telephone Encounter (Signed)
Requested Prescriptions  Pending Prescriptions Disp Refills   amLODipine (NORVASC) 10 MG tablet [Pharmacy Med Name: amLODIPine Besylate Oral Tablet 10 MG] 90 tablet 3    Sig: TAKE 1 TABLET EVERY DAY     Cardiovascular: Calcium Channel Blockers 2 Passed - 12/05/2022 11:55 AM      Passed - Last BP in normal range    BP Readings from Last 1 Encounters:  09/13/22 136/64         Passed - Last Heart Rate in normal range    Pulse Readings from Last 1 Encounters:  09/13/22 73         Passed - Valid encounter within last 6 months    Recent Outpatient Visits           2 months ago Annual physical exam   [facility] Malva Limes, MD   6 months ago Type 2 diabetes mellitus with stage 1 chronic kidney disease, without long-term current use of insulin (HCC)   [facility] Malva Limes, MD   8 months ago Cough, unspecified type   [facility] Malva Limes, MD   9 months ago Type 2 diabetes mellitus with stage 1 chronic kidney disease, without long-term current use of insulin (HCC)   [facility] Malva Limes, MD   1 year ago Primary hypertension   [facility] Malva Limes, MD       Future Appointments             In 1 week Fisher, Demetrios Isaacs, MD [facility] [facility]             omeprazole (PRILOSEC) 20 MG capsule [Pharmacy Med Name: Omeprazole Oral Capsule Delayed Release 20 MG] 90 capsule 3    Sig: TAKE 1 CAPSULE EVERY DAY     Gastroenterology: Proton Pump Inhibitors Passed - 12/05/2022 11:55 AM      Passed - Valid encounter within last 12 months    Recent Outpatient Visits           2 months ago Annual physical exam   [facility] Malva Limes, MD   6 months ago Type 2 diabetes mellitus with stage 1 chronic kidney disease, without long-term current use of insulin (HCC)    [place] [facility] Malva Limes, MD   8 months ago Cough, unspecified type   [facility] Malva Limes, MD   9 months ago Type 2 diabetes mellitus with stage 1 chronic kidney disease, without long-term current use of insulin (HCC)   [facility] [facility] Malva Limes, MD   1 year ago Primary hypertension   [place] [facility] Malva Limes, MD       Future Appointments             In 1 week Fisher, Demetrios Isaacs, MD [facility]             spironolactone (ALDACTONE) 50 MG tablet [Pharmacy Med Name: Spironolactone Oral Tablet 50 MG] 90 tablet 3    Sig: TAKE 1 TABLET EVERY DAY (DOSE CHANGE)     Cardiovascular: Diuretics - Aldosterone Antagonist Failed - 12/05/2022 11:55 AM      Failed - Na in normal range and within 180 days    Sodium  Date Value Ref Range Status  09/13/2022 133 (L) 134 - 144 mmol/L Final  07/23/2011 145 136 - 145 mmol/L Final         Passed - Cr in normal range and within 180 days    Creatinine  Date Value Ref Range Status  07/23/2011 0.59 (L) 0.60 - 1.30 mg/dL Final   Creatinine, Ser  Date Value Ref Range Status  09/13/2022 0.94 0.57 - 1.00 mg/dL Final   Creatinine, POC  Date Value Ref Range Status  09/11/2015 n/a mg/dL Final         Passed - K in normal range and within 180 days    Potassium  Date Value Ref Range Status  09/13/2022 4.4 3.5 - 5.2 mmol/L Final  07/23/2011 3.3 (L) 3.5 - 5.1 mmol/L Final         Passed - eGFR is 30 or above and within 180 days    EGFR (African American)  Date Value Ref Range Status  07/23/2011 >60  Final   GFR calc Af Amer  Date Value Ref Range Status  05/28/2019 69 >59 mL/min/1.73 Final    Comment:    **[facility] currently reports eGFR in compliance with the current**   recommendations of the [employer]. [facility] will   update reporting as new guidelines are published  from the NKF-ASN   Task force.    EGFR (Non-African Amer.)  Date Value Ref Range Status  07/23/2011 >60  Final    Comment:    eGFR values <73mL/min/1.73 m2 may be an indication of chronic kidney disease (CKD). Calculated eGFR is useful in patients with stable renal function. The eGFR calculation will not be reliable in acutely ill patients when serum creatinine is changing rapidly. It is not useful in  patients on dialysis. The eGFR calculation may not be applicable to patients at the low and high extremes of body sizes, pregnant women, and vegetarians.    GFR calc non Af Amer  Date Value Ref Range Status  05/28/2019 60 >59 mL/min/1.73 Final   eGFR  Date Value Ref Range Status  09/13/2022 59 (L) >59 mL/min/1.73 Final         Passed - Last BP in normal range    BP Readings from Last 1 Encounters:  09/13/22 136/64         Passed - Valid encounter within last 6 months    Recent Outpatient Visits           2 months ago Annual physical exam   [facility] Malva Limes, MD   6 months ago Type 2 diabetes mellitus with stage 1 chronic kidney disease, without long-term current use of insulin (HCC)   [place] [facility] Malva Limes, MD   8 months ago Cough, unspecified type   [facility] Malva Limes, MD   9 months ago Type 2 diabetes mellitus with stage 1 chronic kidney disease, without long-term current use of insulin (HCC)   [facility] Malva Limes, MD   1 year ago Primary hypertension   [place] Malva Limes, MD       Future Appointments             In 1 week Fisher, Demetrios Isaacs, MD [facility]

## 2022-12-13 ENCOUNTER — Ambulatory Visit (INDEPENDENT_AMBULATORY_CARE_PROVIDER_SITE_OTHER): Payer: Medicare HMO | Admitting: Family Medicine

## 2022-12-13 ENCOUNTER — Encounter: Payer: Self-pay | Admitting: Family Medicine

## 2022-12-13 VITALS — BP 136/51 | HR 76 | Resp 16 | Ht 62.0 in | Wt 162.3 lb

## 2022-12-13 DIAGNOSIS — E782 Mixed hyperlipidemia: Secondary | ICD-10-CM

## 2022-12-13 DIAGNOSIS — E1122 Type 2 diabetes mellitus with diabetic chronic kidney disease: Secondary | ICD-10-CM

## 2022-12-13 DIAGNOSIS — N181 Chronic kidney disease, stage 1: Secondary | ICD-10-CM

## 2022-12-13 DIAGNOSIS — Z7984 Long term (current) use of oral hypoglycemic drugs: Secondary | ICD-10-CM

## 2022-12-13 LAB — POCT GLYCOSYLATED HEMOGLOBIN (HGB A1C)
Est. average glucose Bld gHb Est-mCnc: 134
Hemoglobin A1C: 6.3 % — AB (ref 4.0–5.6)

## 2022-12-13 MED ORDER — GLIPIZIDE ER 2.5 MG PO TB24: 2.5000 mg | ORAL_TABLET | Freq: Every day | ORAL | 3 refills | Status: AC

## 2022-12-13 NOTE — Patient Instructions (Signed)
.   Please review the attached list of medications and notify my office if there are any errors.   . Please bring all of your medications to every appointment so we can make sure that our medication list is the same as yours.   

## 2022-12-13 NOTE — Progress Notes (Signed)
Established patient visit   Patient: Natalie Bray   DOB: 06-25-1934   88 y.o. Female  MRN: [id number] Visit Date: 12/13/2022  Today's healthcare provider: Mila Merry, MD   Chief Complaint  Patient presents with   Medical Management of Chronic Issues    Follow-up DM   Subjective    Discussed the use of AI scribe software for clinical note transcription with the patient, who gave verbal consent to proceed.  History of Present Illness   The patient, with a history of diabetes, presented for a routine check-up and A1c monitoring. She reported feeling generally well, with no new health concerns. Her recent A1c was 6.3, down from 7.0 at the previous visit, indicating good glycemic control. She has been monitoring her blood glucose levels at home, which have been within acceptable ranges, with occasional low readings. The patient noted that these low readings often corresponded with longer intervals between meals, and she has been managing this by eating approximately every four hours.  The patient is currently on metformin and glipizide for diabetes management. She reported occasional episodes of hypoglycemia, which raised concerns about the dosage of glipizide. The patient also reported no issues with her other medications. She has not needed to take clonidine for blood pressure for several years, and she has discontinued fluoxetine and a sleep aid following the passing of a loved one. She occasionally takes Tylenol to aid sleep when needed.  The patient checks her blood pressure at home, which has been within normal ranges. She denied experiencing any symptoms of hypotension such as lightheadedness or dizziness. She also denied any respiratory symptoms, palpitations, peripheral edema, numbness, tingling, or pain in the lower extremities. Her cholesterol levels were last checked in August and were within acceptable ranges. The patient has received her flu shot for the current season.        Medications: Outpatient Medications Prior to Visit  Medication Sig Note   acetaminophen (TYLENOL) 500 MG tablet Take 1,000 mg by mouth every 6 (six) hours as needed for moderate pain.    amLODipine (NORVASC) 10 MG tablet TAKE 1 TABLET EVERY DAY    aspirin 81 MG chewable tablet Chew 81 mg by mouth daily.    calcium-vitamin D (OSCAL WITH D) 500-200 MG-UNIT tablet Take 1 tablet by mouth daily.    Cholecalciferol (VITAMIN D3) 50 MCG (2000 UT) TABS Take 1 tablet by mouth daily.    clobetasol ointment (TEMOVATE) 0.05 % Apply to affected area every night for 4 weeks, then every other day for 4 weeks and then twice a week for 4 weeks or until resolution.    docusate sodium (COLACE) 100 MG capsule Take 100 mg by mouth daily.    lovastatin (MEVACOR) 40 MG tablet TAKE 1 TABLET EVERY DAY    metFORMIN (GLUCOPHAGE) 1000 MG tablet TAKE 1 TABLET TWICE DAILY    Omega-3 1000 MG CAPS Take 1,000 mg by mouth daily.    omeprazole (PRILOSEC) 20 MG capsule TAKE 1 CAPSULE EVERY DAY    spironolactone (ALDACTONE) 50 MG tablet TAKE 1 TABLET EVERY DAY (DOSE CHANGE)    TRUE METRIX BLOOD GLUCOSE TEST test strip TEST AS DIRECTED    TRUEplus Lancets 33G MISC TEST BLOOD SUGAR EVERY DAY    valsartan-hydrochlorothiazide (DIOVAN-HCT) 320-25 MG tablet TAKE 1 TABLET EVERY DAY    vitamin E 180 MG (400 UNITS) capsule Take 1 capsule by mouth daily.    [DISCONTINUED] glipiZIDE (GLUCOTROL XL) 5 MG 24 hr tablet TAKE  1 TABLET EVERY DAY WITH BREAKFAST    [DISCONTINUED] cloNIDine (CATAPRES) 0.1 MG tablet TAKE 1 TABLET AS NEEDED FOR BLOOD PRESSURE OVER 180. MAY REPEAT IN 30 MINUTES. DO NOT TAKE MORE THAN 3 IN A DAY 12/13/2022: no longer   [DISCONTINUED] FLUoxetine (PROZAC) 10 MG capsule TAKE 1 CAPSULE EVERY DAY 12/13/2022: no no longer needed   No facility-administered medications prior to visit.   Review of Systems     Objective    BP (!) 136/51 (BP Location: Left Arm, Patient Position: Sitting, Cuff Size: Large)   Pulse 76    Resp 16   Ht 5\' 2"  (1.575 m)   Wt 162 lb 4.8 oz (73.6 kg)   BMI 29.69 kg/m     Physical Exam   CHEST: Lungs clear to auscultation. CARDIOVASCULAR: Heart sounds normal on auscultation. EXTREMITIES: No edema. NEUROLOGICAL: No numbness or tingling in feet or toes; no pain or burning in legs on walking or going upstairs.     Results for orders placed or performed in visit on 12/13/22  POCT glycosylated hemoglobin (Hb A1C)  Result Value Ref Range   Hemoglobin A1C 6.3 (A) 4.0 - 5.6 %   Est. average glucose Bld gHb Est-mCnc 134     Assessment & Plan        Type 2 Diabetes Mellitus Improved glycemic control with A1c of 6.3, down from 7.0. Patient reports occasional hypoglycemia, potentially related to Glipizide use. -Reduce Glipizide to 2.5mg  daily. -Check A1c in early March 2025.  Hypertension Well controlled on Amlodipine and Valsartan. No symptoms of hypotension reported. -Continue current regimen.  Depression/Insomnia No longer taking Fluoxetine or sleep aid. Reports managing sleep with occasional use of Tylenol. -Remove Fluoxetine and sleep aid from medication list.  General Health Maintenance -Continue Metformin for diabetes management. -Flu shot confirmed for current season. -Schedule follow-up appointment in early March 2025.    Return in about 16 weeks (around 04/04/2023) for Diabetes, Hypertension.      Mila Merry, MD  [facility] [phone] (phone) [phone] (fax)  [facility]

## 2022-12-30 DIAGNOSIS — M25511 Pain in right shoulder: Secondary | ICD-10-CM | POA: Diagnosis not present

## 2022-12-30 DIAGNOSIS — M7541 Impingement syndrome of right shoulder: Secondary | ICD-10-CM | POA: Diagnosis not present

## 2023-02-14 DIAGNOSIS — M12812 Other specific arthropathies, not elsewhere classified, left shoulder: Secondary | ICD-10-CM | POA: Insufficient documentation

## 2023-02-14 DIAGNOSIS — M75122 Complete rotator cuff tear or rupture of left shoulder, not specified as traumatic: Secondary | ICD-10-CM | POA: Diagnosis not present

## 2023-02-14 DIAGNOSIS — M12811 Other specific arthropathies, not elsewhere classified, right shoulder: Secondary | ICD-10-CM | POA: Diagnosis not present

## 2023-02-14 DIAGNOSIS — E1122 Type 2 diabetes mellitus with diabetic chronic kidney disease: Secondary | ICD-10-CM | POA: Diagnosis not present

## 2023-02-14 DIAGNOSIS — M25512 Pain in left shoulder: Secondary | ICD-10-CM | POA: Diagnosis not present

## 2023-02-14 DIAGNOSIS — M75121 Complete rotator cuff tear or rupture of right shoulder, not specified as traumatic: Secondary | ICD-10-CM | POA: Diagnosis not present

## 2023-02-19 ENCOUNTER — Encounter: Payer: Self-pay | Admitting: Obstetrics

## 2023-02-19 ENCOUNTER — Ambulatory Visit: Payer: Medicare HMO | Admitting: Obstetrics

## 2023-02-19 VITALS — BP 120/80 | Ht 62.0 in | Wt 164.0 lb

## 2023-02-19 DIAGNOSIS — N904 Leukoplakia of vulva: Secondary | ICD-10-CM | POA: Diagnosis not present

## 2023-02-19 DIAGNOSIS — N393 Stress incontinence (female) (male): Secondary | ICD-10-CM | POA: Diagnosis not present

## 2023-02-19 LAB — POCT URINALYSIS DIPSTICK
Bilirubin, UA: NEGATIVE
Blood, UA: NEGATIVE
Glucose, UA: NEGATIVE
Ketones, UA: NEGATIVE
Leukocytes, UA: NEGATIVE
Nitrite, UA: NEGATIVE
Protein, UA: NEGATIVE
Spec Grav, UA: 1.01 (ref 1.010–1.025)
Urobilinogen, UA: 1 U/dL
pH, UA: 6 (ref 5.0–8.0)

## 2023-02-19 MED ORDER — CLOBETASOL PROPIONATE 0.05 % EX OINT: TOPICAL_OINTMENT | CUTANEOUS | 3 refills | Status: DC

## 2023-02-19 NOTE — Progress Notes (Signed)
    GYNECOLOGY PROGRESS NOTE  Subjective:  PCP: Malva Limes, MD  Patient ID: Natalie Bray, female    DOB: [birth year], 89 y.o.   MRN: [id number]  HPI  Patient is a 89 y.o. [id number] female who presents for vaginal soreness for the past two weeks, she has seen Dr Valentino Saxon for some kind of infection and the cream medication is not helping. She cannot touch her vaginal area to wipe or shower and even sitting in painful.  She is worried that the infection has not cleared up by now.   She's also having pain in her kidneys right side of her lower back. She has took tylenol and ibuprofen for pain and it's not helping much. PMH of stress in continence, s/p two bladder surgeries. Denies dysuria, hematuria, and pain is constant, not colicky.   The following portions of the patient's history were reviewed and updated as appropriate: allergies, current medications, past family history, past medical history, past social history, past surgical history, and problem list.  Review of Systems Pertinent items are noted in HPI.   Objective:   Blood pressure 120/80, height 5\' 2"  (1.575 m), weight 164 lb (74.4 kg). Body mass index is 30 kg/m.  General appearance: alert, cooperative, and no distress Abdomen: soft, non-tender; bowel sounds normal; no masses,  no organomegaly; no flank pain.  Pelvic: bilateral labia erythematous, crease between majora/minora with white, atrophic patches. No ulcerations or flaking. No vaginal discharge. No cystocele w/valsalva.  Extremities: extremities normal, atraumatic, no cyanosis or edema Neurologic: Grossly normal   Assessment/Plan:   1. Lichen sclerosus et atrophicus of the vulva   2. Stress incontinence of urine     Lichen sclerosus -We had a long discussion about pt's misconception re: condition, is not an infection, is an autoimmune condition and emphasized the life-long nature and chronicity of LS. We discussed this can wax and wane and that the ointment Dr.  Valentino Saxon prescribed is the best to treat her flares. We also discussed preventative measures she can take and an UpToDate patient handout on LS was provided to her today.  -Refilled her Clobetasol to the mail order pharmacy  Stress incontinence -UA in office negative and her "kidney pain" is much lower, suspect musculoskeletal, do not feel she has kidney stones as she suspects. Advised that if continues, should discuss with her PCP.   Is due for her next annual in 1 month with Dr. Valentino Saxon   Total time was 35 minutes. That includes chart review before the visit, the actual patient visit, and time spent on documentation after the visit. Time excludes procedures, if any.    Julieanne Manson, DO [facility]

## 2023-03-04 DIAGNOSIS — M12812 Other specific arthropathies, not elsewhere classified, left shoulder: Secondary | ICD-10-CM | POA: Diagnosis not present

## 2023-03-04 DIAGNOSIS — M12811 Other specific arthropathies, not elsewhere classified, right shoulder: Secondary | ICD-10-CM | POA: Diagnosis not present

## 2023-03-06 ENCOUNTER — Other Ambulatory Visit: Payer: Self-pay | Admitting: Family Medicine

## 2023-03-06 DIAGNOSIS — I10 Essential (primary) hypertension: Secondary | ICD-10-CM

## 2023-03-07 NOTE — Telephone Encounter (Signed)
Requested medication (s) are due for refill today: yes  Requested medication (s) are on the active medication list: yes  Last refill:  12/05/22 #90  Future visit scheduled: yes  Notes to clinic:  overdue lab work    Requested Prescriptions  Pending Prescriptions Disp Refills   spironolactone (ALDACTONE) 50 MG tablet [Pharmacy Med Name: Spironolactone Oral Tablet 50 MG] 90 tablet     Sig: TAKE 1 TABLET EVERY DAY     Cardiovascular: Diuretics - Aldosterone Antagonist Failed - 03/07/2023 11:27 AM      Failed - Na in normal range and within 180 days    Sodium  Date Value Ref Range Status  09/13/2022 133 (L) 134 - 144 mmol/L Final  07/23/2011 145 136 - 145 mmol/L Final         Passed - Cr in normal range and within 180 days    Creatinine  Date Value Ref Range Status  07/23/2011 0.59 (L) 0.60 - 1.30 mg/dL Final   Creatinine, Ser  Date Value Ref Range Status  09/13/2022 0.94 0.57 - 1.00 mg/dL Final   Creatinine, POC  Date Value Ref Range Status  09/11/2015 n/a mg/dL Final         Passed - K in normal range and within 180 days    Potassium  Date Value Ref Range Status  09/13/2022 4.4 3.5 - 5.2 mmol/L Final  07/23/2011 3.3 (L) 3.5 - 5.1 mmol/L Final         Passed - eGFR is 30 or above and within 180 days    EGFR (African American)  Date Value Ref Range Status  07/23/2011 >60  Final   GFR calc Af Amer  Date Value Ref Range Status  05/28/2019 69 >59 mL/min/1.73 Final    Comment:    **[facility] currently reports eGFR in compliance with the current**   recommendations of the [employer]. [facility] will   update reporting as new guidelines are published from the NKF-ASN   Task force.    EGFR (Non-African Amer.)  Date Value Ref Range Status  07/23/2011 >60  Final    Comment:    eGFR values <51mL/min/1.73 m2 may be an indication of chronic kidney disease (CKD). Calculated eGFR is useful in patients with stable renal function. The eGFR calculation will  not be reliable in acutely ill patients when serum creatinine is changing rapidly. It is not useful in  patients on dialysis. The eGFR calculation may not be applicable to patients at the low and high extremes of body sizes, pregnant women, and vegetarians.    GFR calc non Af Amer  Date Value Ref Range Status  05/28/2019 60 >59 mL/min/1.73 Final   eGFR  Date Value Ref Range Status  09/13/2022 59 (L) >59 mL/min/1.73 Final         Passed - Last BP in normal range    BP Readings from Last 1 Encounters:  02/19/23 120/80         Passed - Valid encounter within last 6 months    Recent Outpatient Visits           2 months ago Type 2 diabetes mellitus with stage 1 chronic kidney disease, without long-term current use of insulin (HCC)   [facility] Malva Limes, MD   5 months ago Annual physical exam   [facility] Malva Limes, MD   9 months ago Type 2 diabetes mellitus with stage 1 chronic kidney disease, without  long-term current use of insulin (HCC)   [place] [facility] Malva Limes, MD   11 months ago Cough, unspecified type   [facility] Malva Limes, MD   1 year ago Type 2 diabetes mellitus with stage 1 chronic kidney disease, without long-term current use of insulin (HCC)   [facility] Sherrie Mustache, Demetrios Isaacs, MD       Future Appointments             In 1 week Fisher, Demetrios Isaacs, MD [facility]            Signed Prescriptions Disp Refills   amLODipine (NORVASC) 10 MG tablet 90 tablet 0    Sig: TAKE 1 TABLET EVERY DAY     Cardiovascular: Calcium Channel Blockers 2 Passed - 03/07/2023 11:27 AM      Passed - Last BP in normal range    BP Readings from Last 1 Encounters:  02/19/23 120/80         Passed - Last Heart Rate in normal range    Pulse Readings from Last 1 Encounters:  12/13/22 76          Passed - Valid encounter within last 6 months    Recent Outpatient Visits           2 months ago Type 2 diabetes mellitus with stage 1 chronic kidney disease, without long-term current use of insulin (HCC)   [facility] Sherrie Mustache, Demetrios Isaacs, MD   5 months ago Annual physical exam   [facility] Malva Limes, MD   9 months ago Type 2 diabetes mellitus with stage 1 chronic kidney disease, without long-term current use of insulin (HCC)   [facility] Malva Limes, MD   11 months ago Cough, unspecified type   [facility]   1 year ago Type 2 diabetes mellitus with stage 1 chronic kidney disease, without long-term current use of insulin (HCC)   [facility] Malva Limes, MD       Future Appointments             In 1 week Fisher, Demetrios Isaacs, MD [facility]

## 2023-03-07 NOTE — Telephone Encounter (Signed)
Requested Prescriptions  Pending Prescriptions Disp Refills   amLODipine (NORVASC) 10 MG tablet [Pharmacy Med Name: amLODIPine Besylate Oral Tablet 10 MG] 90 tablet 0    Sig: TAKE 1 TABLET EVERY DAY     Cardiovascular: Calcium Channel Blockers 2 Passed - 03/07/2023 11:26 AM      Passed - Last BP in normal range    BP Readings from Last 1 Encounters:  02/19/23 120/80         Passed - Last Heart Rate in normal range    Pulse Readings from Last 1 Encounters:  12/13/22 76         Passed - Valid encounter within last 6 months    Recent Outpatient Visits           2 months ago Type 2 diabetes mellitus with stage 1 chronic kidney disease, without long-term current use of insulin (HCC)   [place] [facility] Malva Limes, MD   5 months ago Annual physical exam   [place] [facility] Malva Limes, MD   9 months ago Type 2 diabetes mellitus with stage 1 chronic kidney disease, without long-term current use of insulin (HCC)   [facility] Malva Limes, MD   11 months ago Cough, unspecified type   [facility] Malva Limes, MD   1 year ago Type 2 diabetes mellitus with stage 1 chronic kidney disease, without long-term current use of insulin (HCC)   [facility] Malva Limes, MD       Future Appointments             In 1 week Fisher, Demetrios Isaacs, MD [facility]             spironolactone (ALDACTONE) 50 MG tablet [Pharmacy Med Name: Spironolactone Oral Tablet 50 MG] 90 tablet     Sig: TAKE 1 TABLET EVERY DAY     Cardiovascular: Diuretics - Aldosterone Antagonist Failed - 03/07/2023 11:26 AM      Failed - Na in normal range and within 180 days    Sodium  Date Value Ref Range Status  09/13/2022 133 (L) 134 - 144 mmol/L Final  07/23/2011 145 136 - 145 mmol/L Final         Passed - Cr in normal range and within 180 days     Creatinine  Date Value Ref Range Status  07/23/2011 0.59 (L) 0.60 - 1.30 mg/dL Final   Creatinine, Ser  Date Value Ref Range Status  09/13/2022 0.94 0.57 - 1.00 mg/dL Final   Creatinine, POC  Date Value Ref Range Status  09/11/2015 n/a mg/dL Final         Passed - K in normal range and within 180 days    Potassium  Date Value Ref Range Status  09/13/2022 4.4 3.5 - 5.2 mmol/L Final  07/23/2011 3.3 (L) 3.5 - 5.1 mmol/L Final         Passed - eGFR is 30 or above and within 180 days    EGFR (African American)  Date Value Ref Range Status  07/23/2011 >60  Final   GFR calc Af Amer  Date Value Ref Range Status  05/28/2019 69 >59 mL/min/1.73 Final    Comment:    **[facility] currently reports eGFR in compliance with the current**   recommendations of the [employer]. [facility] will   update reporting as new guidelines are published from  the NKF-ASN   Task force.    EGFR (Non-African Amer.)  Date Value Ref Range Status  07/23/2011 >60  Final    Comment:    eGFR values <63mL/min/1.73 m2 may be an indication of chronic kidney disease (CKD). Calculated eGFR is useful in patients with stable renal function. The eGFR calculation will not be reliable in acutely ill patients when serum creatinine is changing rapidly. It is not useful in  patients on dialysis. The eGFR calculation may not be applicable to patients at the low and high extremes of body sizes, pregnant women, and vegetarians.    GFR calc non Af Amer  Date Value Ref Range Status  05/28/2019 60 >59 mL/min/1.73 Final   eGFR  Date Value Ref Range Status  09/13/2022 59 (L) >59 mL/min/1.73 Final         Passed - Last BP in normal range    BP Readings from Last 1 Encounters:  02/19/23 120/80         Passed - Valid encounter within last 6 months    Recent Outpatient Visits           2 months ago Type 2 diabetes mellitus with stage 1 chronic kidney disease, without long-term current use of insulin  (HCC)   [place] Malva Limes, MD   5 months ago Annual physical exam   [facility] Malva Limes, MD   9 months ago Type 2 diabetes mellitus with stage 1 chronic kidney disease, without long-term current use of insulin (HCC)   [place] [facility] Malva Limes, MD   11 months ago Cough, unspecified type   [facility] Mal[facility] L[facility], MD   1 year ago Type 2 diabetes mellitus with stage 1 chronic kidney disease, without long-term current use of insulin (HCC)   [place] [facility] Malva Limes, MD       Future Appointments             In 1 week Fisher, Demetrios Isaacs, MD [facility]

## 2023-03-14 ENCOUNTER — Ambulatory Visit: Payer: Medicare HMO | Admitting: Family Medicine

## 2023-03-19 ENCOUNTER — Ambulatory Visit: Payer: Medicare HMO | Admitting: Family Medicine

## 2023-03-19 ENCOUNTER — Encounter: Payer: Self-pay | Admitting: Family Medicine

## 2023-03-19 VITALS — BP 142/78 | HR 86 | Resp 16 | Ht 62.0 in | Wt 162.0 lb

## 2023-03-19 DIAGNOSIS — I10 Essential (primary) hypertension: Secondary | ICD-10-CM | POA: Diagnosis not present

## 2023-03-19 DIAGNOSIS — E1122 Type 2 diabetes mellitus with diabetic chronic kidney disease: Secondary | ICD-10-CM | POA: Diagnosis not present

## 2023-03-19 DIAGNOSIS — N181 Chronic kidney disease, stage 1: Secondary | ICD-10-CM

## 2023-03-19 LAB — POCT GLYCOSYLATED HEMOGLOBIN (HGB A1C)
Est. average glucose Bld gHb Est-mCnc: 137
Hemoglobin A1C: 6.4 % — AB (ref 4.0–5.6)

## 2023-03-19 NOTE — Patient Instructions (Addendum)
 Please review the attached list of medications and notify my office if there are any errors.   Please bring all of your medications to every appointment so we can make sure that our medication list is the same as yours.   I recommend that you get the RSV vaccine (Arexvy) to protect yourself from certain types of viral pneumonia. This vaccine is typically covered under Medicare prescription plans and should be covered if you get it at your pharmacy.

## 2023-03-24 ENCOUNTER — Other Ambulatory Visit: Payer: Self-pay | Admitting: Family Medicine

## 2023-03-24 NOTE — Progress Notes (Signed)
 Established patient visit   Patient: Natalie Bray   DOB: 1934/02/13   89 y.o. Female  MRN: [id number] Visit Date: 03/19/2023  Today's healthcare provider: Mila Merry, MD   Chief Complaint  Patient presents with   Medical Management of Chronic Issues    HTN and T2DM   Subjective    Discussed the use of AI scribe software for clinical note transcription with the patient, who gave verbal consent to proceed.  History of Present Illness   Natalie Bray is an 89 year old female with diabetes and hypertension who presents for a routine checkup.  Her blood sugar levels have been stable, typically in the low to mid hundreds. She checks her blood sugar daily and has not experienced frequent episodes of hypoglycemia. Her last A1c was 6.4, consistent with previous results.  She monitors her blood pressure at home and usually takes her medication in the morning. She did not take her medication on the morning of the visit but her blood pressure readings are generally stable. No chest pain, heart flutter, or shortness of breath.  She experiences nasal drainage and occasional cough, which she attributes to sinus issues. She has not specified any current medications for this condition but has discussed using allergy nasal sprays in the past.       Medications: Outpatient Medications Prior to Visit  Medication Sig   acetaminophen (TYLENOL) 500 MG tablet Take 1,000 mg by mouth every 6 (six) hours as needed for moderate pain.   amLODipine (NORVASC) 10 MG tablet TAKE 1 TABLET EVERY DAY   aspirin 81 MG chewable tablet Chew 81 mg by mouth daily.   calcium-vitamin D (OSCAL WITH D) 500-200 MG-UNIT tablet Take 1 tablet by mouth daily.   Cholecalciferol (VITAMIN D3) 50 MCG (2000 UT) TABS Take 1 tablet by mouth daily.   clobetasol ointment (TEMOVATE) 0.05 % Apply to affected areas twice daily, in the morning and at bedtime. Use regularly with symptoms, can taper and decrease when symptoms  resolve.   docusate sodium (COLACE) 100 MG capsule Take 100 mg by mouth daily.   glipiZIDE (GLUCOTROL XL) 2.5 MG 24 hr tablet Take 1 tablet (2.5 mg total) by mouth daily with breakfast.   lovastatin (MEVACOR) 40 MG tablet TAKE 1 TABLET EVERY DAY   metFORMIN (GLUCOPHAGE) 1000 MG tablet TAKE 1 TABLET TWICE DAILY   Omega-3 1000 MG CAPS Take 1,000 mg by mouth daily.   omeprazole (PRILOSEC) 20 MG capsule TAKE 1 CAPSULE EVERY DAY   spironolactone (ALDACTONE) 50 MG tablet TAKE 1 TABLET EVERY DAY   TRUE METRIX BLOOD GLUCOSE TEST test strip TEST AS DIRECTED   TRUEplus Lancets 33G MISC TEST BLOOD SUGAR EVERY DAY   valsartan-hydrochlorothiazide (DIOVAN-HCT) 320-25 MG tablet TAKE 1 TABLET EVERY DAY   vitamin E 180 MG (400 UNITS) capsule Take 1 capsule by mouth daily.   No facility-administered medications prior to visit.   Review of Systems     Objective    BP (!) 142/78   Pulse 86   Resp 16   Ht 5\' 2"  (1.575 m)   Wt 162 lb (73.5 kg)   SpO2 100%   BMI 29.63 kg/m   Physical Exam   General: Appearance:    Well developed, well nourished female in no acute distress  Eyes:    PERRL, conjunctiva/corneas clear, EOM's intact       Lungs:     Clear to auscultation bilaterally, respirations unlabored  Heart:  Normal heart rate. Normal rhythm. Occasional skipped beat 2/6 systolic murmur   MS:   All extremities are intact.    Neurologic:   Awake, alert, oriented x 3. No apparent focal neurological defect.           Results for orders placed or performed in visit on 03/19/23  POCT glycosylated hemoglobin (Hb A1C)  Result Value Ref Range   Hemoglobin A1C 6.4 (A) 4.0 - 5.6 %   Est. average glucose Bld gHb Est-mCnc 137     Assessment & Plan        Diabetes Mellitus Stable with home glucose readings in the low to mid hundreds. A1c of 6.4, consistent with previous value of 6.3. -Continue current management and medications.  Hypertension Patient checks blood pressure at home. Today's  reading initially elevated but decreased to 142/78 after rest. -Continue current management and medications.  Allergic Rhinitis Reports nasal drainage and occasional cough likely secondary to postnasal drip. -Consider use of allergy nasal sprays such as Flonase or Nasacort. -Consider use of Coricidin for symptomatic relief, with caution for potential drowsiness.  General Health Maintenance -Plan for annual blood work in August 2025. -Recommend RSV vaccine, to be obtained at pharmacy. -Schedule follow-up appointment for end of summer 2025.    Return in about 6 months (around 09/16/2023).      Mila Merry, MD  [facility] [phone] (phone) [phone] (fax)  [facility]

## 2023-03-25 NOTE — Telephone Encounter (Signed)
 Requested Prescriptions  Pending Prescriptions Disp Refills   TRUEplus Lancets 33G MISC [Pharmacy Med Name: TRUEplus Lancets 33G Miscellaneous] 100 each 1    Sig: TEST BLOOD SUGAR EVERY DAY     Endocrinology: Diabetes - Testing Supplies Passed - 03/25/2023  3:46 PM      Passed - Valid encounter within last 12 months    Recent Outpatient Visits           3 months ago Type 2 diabetes mellitus with stage 1 chronic kidney disease, without long-term current use of insulin (HCC)   [facility] Malva Limes, MD   6 months ago Annual physical exam   [facility] Malva Limes, MD   10 months ago Type 2 diabetes mellitus with stage 1 chronic kidney disease, without long-term current use of insulin (HCC)   [facility] Malva Limes, MD   1 year ago Cough, unspecified type   [facility] Malva Limes, MD   1 year ago Type 2 diabetes mellitus with stage 1 chronic kidney disease, without long-term current use of insulin (HCC)   [facility] Malva Limes, MD       Future Appointments             In 5 months Fisher, Demetrios Isaacs, MD [facility]

## 2023-03-26 ENCOUNTER — Ambulatory Visit: Payer: Medicare HMO | Admitting: Obstetrics and Gynecology

## 2023-04-16 ENCOUNTER — Ambulatory Visit: Payer: Medicare HMO | Admitting: Obstetrics and Gynecology

## 2023-04-16 ENCOUNTER — Encounter: Payer: Self-pay | Admitting: Obstetrics and Gynecology

## 2023-04-16 VITALS — BP 108/70 | HR 85 | Ht 62.0 in | Wt 156.5 lb

## 2023-04-16 DIAGNOSIS — N904 Leukoplakia of vulva: Secondary | ICD-10-CM

## 2023-04-16 DIAGNOSIS — N393 Stress incontinence (female) (male): Secondary | ICD-10-CM | POA: Diagnosis not present

## 2023-04-16 DIAGNOSIS — R351 Nocturia: Secondary | ICD-10-CM | POA: Diagnosis not present

## 2023-04-16 NOTE — Progress Notes (Signed)
 GYNECOLOGY PROGRESS NOTE  Subjective:    Patient ID: Natalie Bray, female    DOB: 07-Sep-1934, 89 y.o.   MRN: [id number]  HPI  Patient is a 89 y.o. G72P2002 female who presents for follow up on Lichen sclerosis. [name] was last seen for this on 02/19/23 by Dr. Lonny Prude.  Follows up  q 6 months as she is able to maintain her pessary. She states she has been treating the area with Clobetasol cream daily and it usually works well for her, however notes that the medication is more expensive thru the mail order pharmacy, and wonders if there the prescription can be sent to [facility] where she it is less expensive.   Also notes feeling like lately it hasn't been working as well and has some mild irritation.  Has been using daily for at least a year.    The following portions of the patient's history were reviewed and updated as appropriate:   She  has a past medical history of Anemia, Anemia, iron deficiency (08/08/2014), Anxiety, Diabetes mellitus without complication (HCC), GERD (gastroesophageal reflux disease), History of chicken pox, History of skin cancer, Hyperlipidemia, Hypertension, and Skin cancer.  She  has a past surgical history that includes Prolapsed bladder (03/2013); Replacement total knee (Left, 01/2011); Replacement total knee (Right, 03/2010); Total abdominal hysterectomy w/ bilateral salpingoophorectomy (1974); Abdominal hysterectomy; Cataract extraction (Bilateral); and Carpal tunnel release (Right, 02/21/2021).  Her family history includes Aneurysm in her father; Diabetes in her brother, brother, sister, and sister; Hypertension in her brother, brother, sister, and sister; Kidney failure in her mother.  She  reports that she has never smoked. She has never used smokeless tobacco. She reports that she does not drink alcohol and does not use drugs.  Current Outpatient Medications on File Prior to Visit  Medication Sig Dispense Refill   acetaminophen (TYLENOL) 500 MG tablet Take  1,000 mg by mouth every 6 (six) hours as needed for moderate pain.     amLODipine (NORVASC) 10 MG tablet TAKE 1 TABLET EVERY DAY 90 tablet 0   aspirin 81 MG chewable tablet Chew 81 mg by mouth daily.     calcium-vitamin D (OSCAL WITH D) 500-200 MG-UNIT tablet Take 1 tablet by mouth daily.     Cholecalciferol (VITAMIN D3) 50 MCG (2000 UT) TABS Take 1 tablet by mouth daily.     clobetasol ointment (TEMOVATE) 0.05 % Apply to affected areas twice daily, in the morning and at bedtime. Use regularly with symptoms, can taper and decrease when symptoms resolve. 60 g 3   docusate sodium (COLACE) 100 MG capsule Take 100 mg by mouth daily.     glipiZIDE (GLUCOTROL XL) 2.5 MG 24 hr tablet Take 1 tablet (2.5 mg total) by mouth daily with breakfast. 90 tablet 3   lovastatin (MEVACOR) 40 MG tablet TAKE 1 TABLET EVERY DAY 90 tablet 3   metFORMIN (GLUCOPHAGE) 1000 MG tablet TAKE 1 TABLET TWICE DAILY 180 tablet 3   Omega-3 1000 MG CAPS Take 1,000 mg by mouth daily.     omeprazole (PRILOSEC) 20 MG capsule TAKE 1 CAPSULE EVERY DAY 90 capsule 2   spironolactone (ALDACTONE) 50 MG tablet TAKE 1 TABLET EVERY DAY 90 tablet 0   TRUE METRIX BLOOD GLUCOSE TEST test strip TEST AS DIRECTED 300 strip 3   TRUEplus Lancets 33G MISC TEST BLOOD SUGAR EVERY DAY 100 each 1   valsartan-hydrochlorothiazide (DIOVAN-HCT) 320-25 MG tablet TAKE 1 TABLET EVERY DAY 90 tablet 3   vitamin  E 180 MG (400 UNITS) capsule Take 1 capsule by mouth daily.     No current facility-administered medications on file prior to visit.   She is allergic to alendronate, betadine [povidone iodine], celecoxib, and povidone-iodine..  Review of Systems Pertinent items are noted in HPI.   Objective:   Blood pressure 108/70, pulse 85, height 5\' 2"  (1.575 m). Body mass index is 29.63 kg/m. General appearance: alert, cooperative, and no distress Abdomen: soft, non-tender; bowel sounds normal; no masses,  no organomegaly. Palpable hernia noted in lower  abdomen/inguinal region, non-incarcerated.  Pelvic: external genitalia with mild lichenification of bilateral labia majora.  Vagina without discharge, atrophic.  No leakage of urine on Valsalva, minimal bladder descensus. Uterus and cervix surgically absent. Bimanual exam not performed.   Extremities: extremities normal, atraumatic, no cyanosis or edema Neurologic: Grossly normal   Assessment:   1. Lichen sclerosus et atrophicus of the vulva   2. Stress incontinence of urine   3. Nocturia    Plan:   1. Lichen sclerosus et atrophicus of the vulva - Advised to continue steroid treatment with active flares as needed, but does not need to continue daily use. Will likely help to be more cost-effective Also prolonged daily use can lead to worsening of the condition. Can use more natural agents such as coconut oil for mild irritation.   2. Stress incontinence of urine - Patient reports h/o 2 "bladder tacks" in the past. Desired to be checked to make sure everything is still in place. No obvious bladder prolapse noted on today's exam.  Stress incontinence is mild.   3. Nocturia - Discussed lifestyle changes (patient notes she drinks juice in the evenings if she feels her blood sugars dropping).  Advised that she discuss with her PCP if this is a frequent occurrence as she may require a medication adjustment. Also advised on glucose tablets that can be used instead of consuming liquids.   RTC in 1 year for annual f/u of lichen sclerosis.   Hildred Laser, MD [facility]

## 2023-04-16 NOTE — Patient Instructions (Signed)
 Itchy, Painful, White Skin Patches (Lichen Sclerosus): What to Know Lichen sclerosus is a skin problem. It can happen on any part of your body. It happens most often in the areas around your genitals or the opening of your butt (anus). Treatment can help to control symptoms. It can also help prevent scarring that may lead to other problems. This skin problem isn't an infection or a fungus. It can't be passed from person to person. What are the causes? The cause of this condition isn't known. It may be related to: The body's defense system, also called the immune system, reacting too strongly. A lack of certain hormones. What increases the risk? Being a female who has stopped having periods. This is called menopause. Being a female who wasn't circumcised. Being a child who hasn't reached puberty yet. What are the signs or symptoms?  Thin, wrinkled, white areas on the skin. Thickened white areas on the skin. Red and swollen patches on the skin. Tears or cracks in the skin. Bruising. Blood blisters. Very bad itching. Pain, itching, or burning when peeing. Trouble pooping, especially in children. How is this treated? This condition may be treated with: Topical steroids. These are creams or ointments that are put on the skin. Medicines that are taken by mouth. Topical immunotherapy. These are creams and ointments that you put on the skin to give your body's defense system a boost. Surgery. This may need to be done if there are problems like scarring. Follow these instructions at home: Medicines Take or apply your medicines only as told. Use creams or ointments as told by your doctor. Skin care Do not scratch the affected areas. Keep the affected areas clean and dry. Clean the affected area gently with water only. Avoid using rough towels or toilet paper. Avoid products that irritate the skin. These include scented soaps, lotions, and bubble bath. Use thick creams or ointments to lessen  itching as told. General instructions Your condition may cause trouble pooping. To help prevent or treat this, you may need to: Take medicines to help you poop. Eat foods high in fiber, like beans, whole grains, and fresh fruits and vegetables. Drink more fluids as told. Keep all follow-up visits. This helps make sure the treatment plan is working. Contact a doctor if: You have more redness, swelling, or pain. You have fluid, blood, or pus coming from the area. You have new patches on your skin. You have a fever. You have pain during sex. This information is not intended to replace advice given to you by your health care provider. Make sure you discuss any questions you have with your health care provider. Document Revised: 08/13/2022 Document Reviewed: 08/13/2022 Elsevier Patient Education  2024 [employer].

## 2023-04-23 ENCOUNTER — Ambulatory Visit: Admitting: Podiatry

## 2023-04-23 ENCOUNTER — Encounter: Payer: Self-pay | Admitting: Podiatry

## 2023-04-23 DIAGNOSIS — D2372 Other benign neoplasm of skin of left lower limb, including hip: Secondary | ICD-10-CM

## 2023-04-23 DIAGNOSIS — M76822 Posterior tibial tendinitis, left leg: Secondary | ICD-10-CM

## 2023-04-23 DIAGNOSIS — M7752 Other enthesopathy of left foot: Secondary | ICD-10-CM

## 2023-04-23 NOTE — Progress Notes (Signed)
 She presents today after having not seen her for a couple of years with a chief concern of pain to the medial foot left.  She wears her short [place] brace and feels that it may be starting to wear and she started to develop a callus with pain over this bone as she points to the navicular tuberosity.  Objective: Vital signs are stable alert oriented x 3.  Pulses are palpable.  She has mild erythema overlying the navicular tuberosity with an area of reactive hyperkeratosis.  There is fluctuance underneath this consistent with a bursitis.  Severe pes planovalgus is noted appears to be rigid with calcaneal valgus.  Assessment: Bursitis pes planovalgus posterior tibial tendon dysfunction.  Plan: At this point I placed aperture padding in her current AFO.  Injected dexamethasone into the bursa and debrided the lesion.  I also set her up for a new AFO to be fabricated with Natalie Bray.  I think a new short mezzo [place] brace would be beneficial.

## 2023-05-07 ENCOUNTER — Encounter: Payer: Self-pay | Admitting: Family Medicine

## 2023-05-07 ENCOUNTER — Ambulatory Visit
Admission: RE | Admit: 2023-05-07 | Discharge: 2023-05-07 | Disposition: A | Discharge status: Home / Self Care | Source: Ambulatory Visit | Attending: Family Medicine | Admitting: Family Medicine

## 2023-05-07 ENCOUNTER — Ambulatory Visit: Admitting: Obstetrics and Gynecology

## 2023-05-07 ENCOUNTER — Ambulatory Visit: Admitting: Family Medicine

## 2023-05-07 ENCOUNTER — Encounter: Payer: Self-pay | Admitting: Obstetrics and Gynecology

## 2023-05-07 ENCOUNTER — Ambulatory Visit: Payer: Self-pay

## 2023-05-07 VITALS — BP 148/57 | HR 96 | Ht 62.0 in | Wt 166.6 lb

## 2023-05-07 VITALS — BP 130/63 | HR 83 | Temp 98.3°F | Resp 16 | Wt 165.4 lb

## 2023-05-07 DIAGNOSIS — I493 Ventricular premature depolarization: Secondary | ICD-10-CM | POA: Diagnosis not present

## 2023-05-07 DIAGNOSIS — R42 Dizziness and giddiness: Secondary | ICD-10-CM | POA: Diagnosis not present

## 2023-05-07 DIAGNOSIS — M810 Age-related osteoporosis without current pathological fracture: Secondary | ICD-10-CM | POA: Diagnosis not present

## 2023-05-07 DIAGNOSIS — R0602 Shortness of breath: Secondary | ICD-10-CM

## 2023-05-07 DIAGNOSIS — R0609 Other forms of dyspnea: Secondary | ICD-10-CM | POA: Diagnosis not present

## 2023-05-07 DIAGNOSIS — R5383 Other fatigue: Secondary | ICD-10-CM | POA: Diagnosis not present

## 2023-05-07 NOTE — Progress Notes (Signed)
    GYNECOLOGY PROGRESS NOTE  Subjective:    Patient ID: Natalie Bray, female    DOB: 07-28-1934, 89 y.o.   MRN: [id number]  HPI  Patient is a 89 y.o. G8P2002 female who presents for follow up on lichen sclerosis. She states she has been treating the area with Clobetasol cream and coconut oil daily and it usually works well for her. She has not had any vaginal itching with this treatment. Was recently seen 3 weeks ago and was told to follow up in 1 year, however it seems patient was a little confused about her appointment.   While here, she does express concerns about sob that is intermittent x 2 weeks, but has worsened in the past 2 days. She also notes that she has some dizziness when going from sitting to standing. Denies any major LE swelling.  Cannot say whether or not she had any recent sick contacts. Notes she has gone to 3 funerals in the last 1.5 weeks and may be making herself tired.  Feels like she has a good appetite and is staying hydrated. She denies fevers or chills.  Does not think she has seasonal allergies but sneezes on occasion and currently has a dry cough. Denies chest pain or palpitations.   The following portions of the patient's history were reviewed and updated as appropriate: allergies, current medications, past family history, past medical history, past social history, past surgical history, and problem list.  Review of Systems Pertinent items are noted in HPI.   Objective:   Blood pressure (!) 148/57, pulse 96, height 5\' 2"  (1.575 m), weight 166 lb 9.6 oz (75.6 kg).  O2 sats 98%. Body mass index is 30.47 kg/m. General appearance: alert and no distress Neck: no adenopathy, no carotid bruit, no JVD, supple, symmetrical, trachea midline, and thyroid not enlarged, symmetric, no tenderness/mass/nodules Lungs: clear to auscultation bilaterally Heart: regular rate and rhythm, S1, S2 normal, no murmur, click, rub or gallop Extremities: extremities normal, atraumatic, no  cyanosis or edema. Mild swelling of left ankle.  Neurologic: Grossly normal   Assessment:   1. SOB (shortness of breath)   2. Dizziness      Plan:   - Discussed possible causes of her symptoms including fatigue, cardiac issues, or respiratory illness (COVID or other mild viral illness), or seasonal allergies. BPs mildly elevated but does have h/o HTN. O2 sats normal in office today. Advised for patient to f/u with her PCP or walk-in clinic for further evaluation of her symptoms. Notes that she contacted her PCP prior to her arrival at this appointment to see about being evaluated today.    Teresa Fender, MD [facility]

## 2023-05-07 NOTE — Patient Instructions (Signed)
 Please review the attached list of medications and notify my office if there are any errors.   Go to [facility]) [facility] at [facility] for your Xrays (phone no. [phone])

## 2023-05-07 NOTE — Progress Notes (Signed)
 Established patient visit   Patient: Natalie Bray   DOB: [birth year]   89 y.o. Female  MRN: [id number] Visit Date: 05/07/2023  Today's healthcare provider: Jeralene Mom, MD   Chief Complaint  Patient presents with   Dizziness    Symptoms: some dizziness when she stands up. Frequency: Intermittent   Shortness of Breath    Frequency: has been going on for a while, but worsening in the last few days.  No URI symptoms.   Subjective    Discussed the use of AI scribe software for clinical note transcription with the patient, who gave verbal consent to proceed.  History of Present Illness   She is an 89 year old female who presents with dizziness, fatigue, and shortness of breath.  She has been experiencing dizziness, fatigue, and shortness of breath for the past two weeks. Initially, she attributed these symptoms to aging, as she needed to rest frequently while performing household chores. However, the symptoms have worsened recently, prompting her to seek medical attention.  No unusual coughing or wheezing, although she occasionally experiences a coughing spell. There have been no recent changes to her medications, and her blood sugar levels have remained stable since her last A1c check in February.  She notes a slight swelling in left foot, which she attributes to a pre-existing condition for which she is seeing a foot doctor. No significant swelling in her hands, feet, or ankles.  No chest pain or palpitations, which she finds puzzling given her shortness of breath. She has not had any recent x-rays of her lungs and has never smoked.  Her last visit to her cardiologist was about a year ago, and she was told everything was fine at that time.       Medications: Outpatient Medications Prior to Visit  Medication Sig   acetaminophen  (TYLENOL ) 500 MG tablet Take 1,000 mg by mouth every 6 (six) hours as needed for moderate pain.   amLODipine (NORVASC) 10 MG tablet TAKE 1  TABLET EVERY DAY   aspirin 81 MG chewable tablet Chew 81 mg by mouth daily.   calcium-vitamin D  (OSCAL WITH D) 500-200 MG-UNIT tablet Take 1 tablet by mouth daily.   Cholecalciferol (VITAMIN D3) 50 MCG (2000 UT) TABS Take 1 tablet by mouth daily.   clobetasol  ointment (TEMOVATE ) 0.05 % Apply to affected areas twice daily, in the morning and at bedtime. Use regularly with symptoms, can taper and decrease when symptoms resolve.   docusate sodium (COLACE) 100 MG capsule Take 100 mg by mouth daily.   glipiZIDE  (GLUCOTROL  XL) 2.5 MG 24 hr tablet Take 1 tablet (2.5 mg total) by mouth daily with breakfast.   lovastatin (MEVACOR) 40 MG tablet TAKE 1 TABLET EVERY DAY   metFORMIN  (GLUCOPHAGE ) 1000 MG tablet TAKE 1 TABLET TWICE DAILY   Omega-3 1000 MG CAPS Take 1,000 mg by mouth daily.   omeprazole (PRILOSEC) 20 MG capsule TAKE 1 CAPSULE EVERY DAY   spironolactone  (ALDACTONE ) 50 MG tablet TAKE 1 TABLET EVERY DAY   TRUE METRIX BLOOD GLUCOSE TEST test strip TEST AS DIRECTED   TRUEplus Lancets 33G MISC TEST BLOOD SUGAR EVERY DAY   valsartan -hydrochlorothiazide  (DIOVAN -HCT) 320-25 MG tablet TAKE 1 TABLET EVERY DAY   vitamin E 180 MG (400 UNITS) capsule Take 1 capsule by mouth daily.   No facility-administered medications prior to visit.      Objective    BP 130/63 (BP Location: Left Arm, Patient Position: Sitting, Cuff Size: Normal)   Pulse 83  Temp 98.3 F (36.8 C) (Oral)   Resp 16   Wt 165 lb 6.4 oz (75 kg)   SpO2 100%   BMI 30.25 kg/m   Physical Exam   General: Appearance:    Mildly obese female in no acute distress  Eyes:    PERRL, conjunctiva/corneas clear, EOM's intact       Lungs:     Clear to auscultation bilaterally, respirations unlabored  Heart:    Normal heart rate. Normal rhythm.  2/6 systolic murmur at right upper sternal border   MS:   All extremities are intact.  1+ left ankle edema. No calf tenderness.   Neurologic:   Awake, alert, oriented x 3. No apparent focal  neurological defect.       EKG: PVCs  Assessment & Plan        1. Dyspnea on exertion (Primary)  Dyspnea and Fatigue Increased fatigue and dyspnea without chest pain or palpitations. Normal heart and lung sounds. Differential includes cardiac stress, anemia, or electrolyte imbalance.   - EKG 12-Lead - B Nat Peptide - DG Chest 2 View; Future  2. Osteoporosis, unspecified osteoporosis type, unspecified pathological fracture presence  - VITAMIN D  25 Hydroxy (Vit-D Deficiency, Fractures)  3. PVC (premature ventricular contraction)  - Magnesium  4. Other fatigue  - CBC - Comprehensive metabolic panel with GFR - TSH       Jeralene Mom, MD  [facility] [phone] (phone) [phone] (fax)  [facility]

## 2023-05-07 NOTE — Telephone Encounter (Addendum)
 Copied from CRM [id number]. Topic: Clinical - Red Word Triage >> May 07, 2023  8:40 AM Natalie Bray H wrote: [facility] that prompted transfer to Nurse Triage: Pt called in asking for an appointment because she is very weak and SOB.   Chief Complaint: Shortness of Breath Symptoms: shortness of breath, weakness Frequency: several days Pertinent Negatives: Patient denies shortness of breath at rest, runny nose, cough, chest pain, fever Disposition: [] ED /[] Urgent Care (no appt availability in office) / [] Appointment(In office/virtual)/ []  [facility]/ [] Home Care/ [] Refused Recommended Disposition /[] [facility]/ []  Follow-up with PCP Additional Notes: Patient called and advised that for the past several days she has been feeling short of breath and weak. She also states that sometimes she feels dizzy for a few seconds when she stands up. Patient stated that she just wanted to talk to Dr Sherrie Mustache.  This RN asked if I could get a little more information about what was going on at the moment.  Patient just said that she has been feeling weak and having shortness of breath only on exertion.  Patient states that she has to go now and asks if she can call back later because she is currently at her gynecology appointment. This RN advised her that someone who has weakness/shortness of breath who hasn't ever had it before is concerning and I would recommend that she be evaluated in the next couple of hours for it.  Patient states she will call us back later. This RN advised her to let them know at the gynecology office that she has been having these symptoms as well.  Patient is advised that if she feels worse to go to the Emergency Room to get valuated.  Patient verbalized understanding but hung up in a hurry due to being at her gynecology appointment.  Answer Assessment - Initial Assessment Questions 1. RESPIRATORY STATUS: "Describe your breathing?" (e.g., wheezing, shortness of breath, unable to  speak, severe coughing)      Shortness of breath, weakness 2. ONSET: "When did this breathing problem begin?"      "Several days ago" 3. PATTERN "Does the difficult breathing come and go, or has it been constant since it started?"      Comes and goes 4. SEVERITY: "How bad is your breathing?" (e.g., mild, moderate, severe)    - MILD: No SOB at rest, mild SOB with walking, speaks normally in sentences, can lie down, no retractions, pulse < 100.    - MODERATE: SOB at rest, SOB with minimal exertion and prefers to sit, cannot lie down flat, speaks in phrases, mild retractions, audible wheezing, pulse 100-120.    - SEVERE: Very SOB at rest, speaks in single words, struggling to breathe, sitting hunched forward, retractions, pulse > 120      Mild per patient--- no SOB at rest but present on exertion 5. RECURRENT SYMPTOM: "Have you had difficulty breathing before?" If Yes, ask: "When was the last time?" and "What happened that time?"      No 6. CARDIAC HISTORY: "Do you have any history of heart disease?" (e.g., heart attack, angina, bypass surgery, angioplasty)      No 7. LUNG HISTORY: "Do you have any history of lung disease?"  (e.g., pulmonary embolus, asthma, emphysema)     No 8. CAUSE: "What do you think is causing the breathing problem?"      No 9. OTHER SYMPTOMS: "Do you have any other symptoms? (e.g., dizziness, runny nose, cough, chest pain, fever)  Dizziness sometimes when she stands up but it doesn't last long 10. O2 SATURATION MONITOR:  "Do you use an oxygen saturation monitor (pulse oximeter) at home?" If Yes, ask: "What is your reading (oxygen level) today?" "What is your usual oxygen saturation reading?" (e.g., 95%)       N/a 12. TRAVEL: "Have you traveled out of the country in the last month?" (e.g., travel history, exposures)       ----  Protocols used: Breathing Difficulty-A-AH

## 2023-05-07 NOTE — Telephone Encounter (Signed)
 Reason for Disposition . [1] MILD difficulty breathing (e.g., minimal/no SOB at rest, SOB with walking, pulse <100) AND [2] NEW-onset or WORSE than normal  Protocols used: Breathing Difficulty-A-AH

## 2023-05-08 LAB — COMPREHENSIVE METABOLIC PANEL WITH GFR
ALT: 20 IU/L (ref 0–32)
AST: 19 IU/L (ref 0–40)
Albumin: 4.2 g/dL (ref 3.7–4.7)
Alkaline Phosphatase: 98 IU/L (ref 44–121)
BUN/Creatinine Ratio: 20 (ref 12–28)
BUN: 19 mg/dL (ref 8–27)
Bilirubin Total: <0.2 mg/dL (ref 0.0–1.2)
CO2: 20 mmol/L (ref 20–29)
Calcium: 10.3 mg/dL (ref 8.7–10.3)
Chloride: 95 mmol/L — ABNORMAL LOW (ref 96–106)
Creatinine, Ser: 0.94 mg/dL (ref 0.57–1.00)
Globulin, Total: 2.5 g/dL (ref 1.5–4.5)
Glucose: 89 mg/dL (ref 70–99)
Potassium: 4.5 mmol/L (ref 3.5–5.2)
Sodium: 131 mmol/L — ABNORMAL LOW (ref 134–144)
Total Protein: 6.7 g/dL (ref 6.0–8.5)
eGFR: 59 mL/min/{1.73_m2} — ABNORMAL LOW (ref >59)

## 2023-05-08 LAB — CBC
Hematocrit: 35.7 % (ref 34.0–46.6)
Hemoglobin: 11.5 g/dL (ref 11.1–15.9)
MCH: 28.3 pg (ref 26.6–33.0)
MCHC: 32.2 g/dL (ref 31.5–35.7)
MCV: 88 fL (ref 79–97)
Platelets: 432 10*3/uL (ref 150–450)
RBC: 4.06 x10E6/uL (ref 3.77–5.28)
RDW: 13.3 % (ref 11.7–15.4)
WBC: 10.3 10*3/uL (ref 3.4–10.8)

## 2023-05-08 LAB — MAGNESIUM: Magnesium: 1.9 mg/dL (ref 1.6–2.3)

## 2023-05-08 LAB — VITAMIN D 25 HYDROXY (VIT D DEFICIENCY, FRACTURES): Vit D, 25-Hydroxy: 37.2 ng/mL (ref 30.0–100.0)

## 2023-05-08 LAB — TSH: TSH: 1.86 u[IU]/mL (ref 0.450–4.500)

## 2023-05-08 LAB — BRAIN NATRIURETIC PEPTIDE: BNP: 61.2 pg/mL (ref 0.0–100.0)

## 2023-05-09 ENCOUNTER — Ambulatory Visit: Attending: Family Medicine

## 2023-05-09 ENCOUNTER — Telehealth: Payer: Self-pay

## 2023-05-09 ENCOUNTER — Other Ambulatory Visit: Payer: Self-pay | Admitting: Family Medicine

## 2023-05-09 DIAGNOSIS — R42 Dizziness and giddiness: Secondary | ICD-10-CM

## 2023-05-09 NOTE — Telephone Encounter (Signed)
 Copied from CRM [id number]. Topic: Clinical - Lab/Test Results >> May 09, 2023 12:23 PM Donald Frost wrote: Reason for CRM: The patient called in to receive her lab results which I read from her provider. After reading this it was discovered that Dr Shann Darnel called in a Zio Monitor. The patient wants to know if this will be covered by her insurance because if not she cannot afford it. Please assist patient as soon as possible. She also states she would like a call back to go over her chest x ray results and EKG when available. Please assist patient further.

## 2023-05-09 NOTE — Telephone Encounter (Signed)
 Zio is usually covered by insurance. The radiologist hasn't read her chest xray yet. Her EKG was fine. Just an occasional early heartbeat (PVC)

## 2023-05-12 ENCOUNTER — Telehealth: Payer: Self-pay

## 2023-05-12 NOTE — Telephone Encounter (Signed)
 Copied from CRM [id number]. Topic: Clinical - Lab/Test Results >> May 12, 2023  2:50 PM Caliyah H wrote: Reason for CRM: Patient called today requesting results from recent imaging.  Callback Number: [phone]

## 2023-05-12 NOTE — Telephone Encounter (Signed)
 Patient advised and verbalized understanding

## 2023-05-13 ENCOUNTER — Telehealth: Payer: Self-pay

## 2023-05-13 NOTE — Telephone Encounter (Signed)
 Copied from CRM [id number]. Topic: General - Other >> May 13, 2023 11:22 AM Marissa P wrote: Reason for CRM: Patient still waiting on callback regarding previous crm from today, Unresolved

## 2023-05-13 NOTE — Telephone Encounter (Signed)
 Copied from CRM [id number]. Topic: General - Other >> May 13, 2023  8:39 AM Donald Frost wrote: Reason for CRM: The patient called in stating she received a heart monitor video in the mail and she cannot access it at home. She needs to come in to watch it in the office. I tried to call the CAL several times with no answer. Please assist patient further as soon as possible

## 2023-05-14 ENCOUNTER — Ambulatory Visit (INDEPENDENT_AMBULATORY_CARE_PROVIDER_SITE_OTHER): Admitting: Family Medicine

## 2023-05-14 ENCOUNTER — Telehealth: Payer: Self-pay | Admitting: Family Medicine

## 2023-05-14 ENCOUNTER — Encounter: Payer: Self-pay | Admitting: Family Medicine

## 2023-05-14 VITALS — BP 161/76 | HR 116 | Resp 16 | Ht 62.0 in | Wt 164.8 lb

## 2023-05-14 DIAGNOSIS — I493 Ventricular premature depolarization: Secondary | ICD-10-CM

## 2023-05-14 DIAGNOSIS — R42 Dizziness and giddiness: Secondary | ICD-10-CM

## 2023-05-14 NOTE — Telephone Encounter (Signed)
 Please see other phone encounter on this.

## 2023-05-14 NOTE — Telephone Encounter (Signed)
 Patient came by office today and Zio monitor was placed and have given patient instructions in case of symptoms and answered all her questions. Patient verbalized understanding.

## 2023-05-14 NOTE — Telephone Encounter (Signed)
 Called the radiology department and they are going to asked the radiologist to read it and it should be in the patient's chart in the next few hours.

## 2023-05-14 NOTE — Telephone Encounter (Signed)
 Copied from CRM [id number]. Topic: Clinical - Lab/Test Results >> May 12, 2023  2:50 PM Caliyah H wrote: Reason for CRM: Patient called today requesting results from recent imaging.  Callback Number: [phone]

## 2023-05-14 NOTE — Telephone Encounter (Signed)
 Please call [facility] and see why chest x-ray from 4-16 hasn't been read yet.

## 2023-05-29 DIAGNOSIS — R42 Dizziness and giddiness: Secondary | ICD-10-CM | POA: Diagnosis not present

## 2023-06-03 NOTE — Progress Notes (Signed)
 Nurse visit only to hook up Zio monitor

## 2023-06-04 ENCOUNTER — Other Ambulatory Visit: Payer: Self-pay | Admitting: Family Medicine

## 2023-06-04 DIAGNOSIS — I10 Essential (primary) hypertension: Secondary | ICD-10-CM

## 2023-06-07 ENCOUNTER — Other Ambulatory Visit: Payer: Self-pay | Admitting: Family Medicine

## 2023-06-11 ENCOUNTER — Ambulatory Visit: Payer: Medicare HMO

## 2023-06-11 DIAGNOSIS — Z0000 Encounter for general adult medical examination without abnormal findings: Secondary | ICD-10-CM | POA: Diagnosis not present

## 2023-06-11 NOTE — Progress Notes (Signed)
 Subjective:   Natalie Bray is a 89 y.o. who presents for a Medicare Wellness preventive visit.  As a reminder, Annual Wellness Visits don't include a physical exam, and some assessments may be limited, especially if this visit is performed virtually. We may recommend an in-person follow-up visit with your provider if needed.  Visit Complete: Virtual I connected with  Natalie Bray on 06/11/23 by a audio enabled telemedicine application and verified that I am speaking with the correct person using two identifiers.  Patient Location: Home  Provider Location: Home Office  I discussed the limitations of evaluation and management by telemedicine. The patient expressed understanding and agreed to proceed.  Vital Signs: Because this visit was a virtual/telehealth visit, some criteria may be missing or patient reported. Any vitals not documented were not able to be obtained and vitals that have been documented are patient reported.  VideoDeclined- This patient declined Librarian, academic. Therefore the visit was completed with audio only.  Persons Participating in Visit: Patient.  AWV Questionnaire: No: Patient Medicare AWV questionnaire was not completed prior to this visit.  Cardiac Risk Factors include: advanced age (>35men, >47 women);diabetes mellitus;dyslipidemia;hypertension;sedentary lifestyle;obesity (BMI >30kg/m2);microalbuminuria     Objective:     There were no vitals filed for this visit. There is no height or weight on file to calculate BMI.     06/11/2023    9:01 AM 06/10/2022    9:19 AM 02/21/2021    7:19 AM 02/13/2021   10:43 AM 12/08/2019   11:15 AM 06/23/2018    1:28 PM 01/31/2018   12:15 AM  Advanced Directives  Does Patient Have a Medical Advance Directive? No Yes  Yes Yes No No  Type of Special educational needs teacher of [place];Living will Healthcare Power of [employer] Power of [employer] Power of  [place];Living will    Does patient want to make changes to medical advance directive?    No - Patient declined     Copy of Healthcare Power of Attorney in Chart?   No - copy requested No - copy requested No - copy requested    Would patient like information on creating a medical advance directive? No - Patient declined     No - Patient declined     Current Medications (verified) Outpatient Encounter Medications as of 06/11/2023  Medication Sig   acetaminophen  (TYLENOL ) 500 MG tablet Take 1,000 mg by mouth every 6 (six) hours as needed for moderate pain.   amLODipine (NORVASC) 10 MG tablet TAKE 1 TABLET EVERY DAY   aspirin 81 MG chewable tablet Chew 81 mg by mouth daily.   calcium-vitamin D  (OSCAL WITH D) 500-200 MG-UNIT tablet Take 1 tablet by mouth daily.   Cholecalciferol (VITAMIN D3) 50 MCG (2000 UT) TABS Take 1 tablet by mouth daily.   clobetasol  ointment (TEMOVATE ) 0.05 % Apply to affected areas twice daily, in the morning and at bedtime. Use regularly with symptoms, can taper and decrease when symptoms resolve.   docusate sodium (COLACE) 100 MG capsule Take 100 mg by mouth daily.   glipiZIDE  (GLUCOTROL  XL) 2.5 MG 24 hr tablet Take 1 tablet (2.5 mg total) by mouth daily with breakfast.   lovastatin (MEVACOR) 40 MG tablet TAKE 1 TABLET EVERY DAY   metFORMIN  (GLUCOPHAGE ) 1000 MG tablet TAKE 1 TABLET TWICE DAILY   Omega-3 1000 MG CAPS Take 1,000 mg by mouth daily.   omeprazole (PRILOSEC) 20 MG capsule TAKE 1 CAPSULE EVERY DAY  spironolactone  (ALDACTONE ) 50 MG tablet TAKE 1 TABLET EVERY DAY   TRUE METRIX BLOOD GLUCOSE TEST test strip TEST AS DIRECTED   TRUEplus Lancets 33G MISC TEST BLOOD SUGAR EVERY DAY   valsartan -hydrochlorothiazide  (DIOVAN -HCT) 320-25 MG tablet TAKE 1 TABLET EVERY DAY   vitamin E 180 MG (400 UNITS) capsule Take 1 capsule by mouth daily.   No facility-administered encounter medications on file as of 06/11/2023.    Allergies (verified) Alendronate , Betadine  [povidone iodine], Celecoxib, and Povidone-iodine   History: Past Medical History:  Diagnosis Date   Anemia    Anemia, iron deficiency 08/08/2014   Anxiety    Diabetes mellitus without complication (HCC)    GERD (gastroesophageal reflux disease)    History of chicken pox    History of skin cancer    Hyperlipidemia    Hypertension    Skin cancer    Past Surgical History:  Procedure Laterality Date   ABDOMINAL HYSTERECTOMY     CARPAL TUNNEL RELEASE Right 02/21/2021   Procedure: CARPAL TUNNEL RELEASE ENDOSCOPIC;  Surgeon: Elner Hahn, MD;  Location: [facility] ORS;  Service: Orthopedics;  Laterality: Right;   CATARACT EXTRACTION Bilateral    Prolapsed bladder  03/2013   Dr. Augustus Ledger   REPLACEMENT TOTAL KNEE Left 01/2011   [facility]   REPLACEMENT TOTAL KNEE Right 03/2010   TOTAL ABDOMINAL HYSTERECTOMY W/ BILATERAL SALPINGOOPHORECTOMY  1974   BSO   Family History  Problem Relation Age of Onset   Kidney failure Mother    Aneurysm Father    Hypertension Sister    Diabetes Sister    Diabetes Brother    Hypertension Brother    Diabetes Sister    Hypertension Sister    Diabetes Brother    Hypertension Brother    Social History   Socioeconomic History   Marital status: Widowed    Spouse name: Not on file   Number of children: 2   Years of education: 58   Highest education level: 12th grade  Occupational History   Occupation: retired  Tobacco Use   Smoking status: Never   Smokeless tobacco: Never  Vaping Use   Vaping status: Never Used  Substance and Sexual Activity   Alcohol use: No    Alcohol/week: 0.0 standard drinks of alcohol   Drug use: No   Sexual activity: Not Currently  Other Topics Concern   Not on file  Social History Narrative   Not on file   Social Drivers of Health   Financial Resource Strain: Low Risk  (06/11/2023)   Overall Financial Resource Strain (CARDIA)    Difficulty of Paying Living Expenses: Not hard at all  Food Insecurity: No Food Insecurity  (06/11/2023)   Hunger Vital Sign    Worried About Running Out of Food in the Last Year: Never true    Ran Out of Food in the Last Year: Never true  Transportation Needs: No Transportation Needs (06/11/2023)   PRAPARE - Administrator, Civil Service (Medical): No    Lack of Transportation (Non-Medical): No  Physical Activity: Insufficiently Active (06/11/2023)   Exercise Vital Sign    Days of Exercise per Week: 2 days    Minutes of Exercise per Session: 20 min  Stress: No Stress Concern Present (06/11/2023)   [employer] of Occupational Health - Occupational Stress Questionnaire    Feeling of Stress : Not at all  Social Connections: Moderately Isolated (06/11/2023)   Social Connection and Isolation Panel [NHANES]    Frequency of Communication  with Friends and Family: More than three times a week    Frequency of Social Gatherings with Friends and Family: Twice a week    Attends Religious Services: More than 4 times per year    Active Member of [employer] or Organizations: No    Attends Banker Meetings: Never    Marital Status: Widowed    Tobacco Counseling Counseling given: Not Answered    Clinical Intake:  Pre-visit preparation completed: Yes  Pain : No/denies pain     BMI - recorded: 30 Nutritional Status: BMI > 30  Obese Nutritional Risks: None Diabetes: Yes CBG done?: No Did pt. bring in CBG monitor from home?: No  Lab Results  Component Value Date   HGBA1C 6.4 (A) 03/19/2023   HGBA1C 6.3 (A) 12/13/2022   HGBA1C 7.0 (H) 09/13/2022     How often do you need to have someone help you when you read instructions, pamphlets, or other written materials from your doctor or pharmacy?: 1 - Never  Interpreter Needed?: No  Information entered by :: Dellie Fergusson, LPN   Activities of Daily Living    06/11/2023    9:02 AM  In your present state of health, do you have any difficulty performing the following activities:  Hearing? 0  Vision? 0   Difficulty concentrating or making decisions? 0  Walking or climbing stairs? 0  Dressing or bathing? 0  Doing errands, shopping? 0  Preparing Food and eating ? N  Using the Toilet? N  In the past six months, have you accidently leaked urine? N  Do you have problems with loss of bowel control? N  Managing your Medications? N  Managing your Finances? N  Housekeeping or managing your Housekeeping? N    Patient Care Team: Lamon Pillow, MD as PCP - General (Family Medicine) Dasher, Margette Sheldon, MD (Dermatology) Julia Oats, OD (Optometry) [place], Georgean Kindle, DPM as Consulting Physician (Podiatry) Poggi, Kaylene Pascal, MD as Consulting Physician (Orthopedic Surgery) [facility], [facility] (Optometry)  Indicate any recent Medical Services you may have received from other than [facility] providers in the past year (date may be approximate).     Assessment:    This is a routine wellness examination for Natalie Bray.  Hearing/Vision screen Hearing Screening - Comments:: NO AIDS Vision Screening - Comments:: READERS- [facility]    Goals Addressed             This Visit's Progress    DIET - EAT MORE FRUITS AND VEGETABLES         Depression Screen     06/11/2023    8:59 AM 05/14/2023   10:01 AM 03/19/2023    8:57 AM 12/13/2022    9:45 AM 09/13/2022    9:12 AM 06/10/2022    9:15 AM 02/11/2022    9:29 AM  PHQ 2/9 Scores  PHQ - 2 Score 0 0 0 0 0 0 0  PHQ- 9 Score 0 0     0    Fall Risk     06/11/2023    9:02 AM 03/19/2023    8:57 AM 06/10/2022    9:11 AM 02/11/2022    9:29 AM 06/05/2021    9:04 AM  Fall Risk   Falls in the past year? 0 0 0 0 0  Number falls in past yr: 0 0 0 0 0  Injury with Fall? 0 0 0 0 0  Risk for fall due to : No Fall Risks No Fall Risks  No Fall Risks  No Fall Risks  Follow up Falls evaluation completed  Education provided;Falls prevention discussed  Falls evaluation completed    MEDICARE RISK AT HOME:  Medicare Risk at Home Any stairs in or around the home?: No If  so, are there any without handrails?: No Home free of loose throw rugs in walkways, pet beds, electrical cords, etc?: Yes Adequate lighting in your home to reduce risk of falls?: Yes Life alert?: No Use of a cane, walker or w/c?: No Grab bars in the bathroom?: Yes Shower chair or bench in shower?: Yes Elevated toilet seat or a handicapped toilet?: Yes  TIMED UP AND GO:  Was the test performed?  No  Cognitive Function: 6CIT completed        06/11/2023    9:03 AM 06/10/2022    9:20 AM 12/08/2019   11:19 AM  6CIT Screen  What Year? 0 points 0 points 0 points  What month? 0 points 0 points 0 points  What time? 0 points 0 points 0 points  Count back from 20 0 points 0 points 0 points  Months in reverse 0 points 0 points 0 points  Repeat phrase 0 points 0 points 2 points  Total Score 0 points 0 points 2 points    Immunizations Immunization History  Administered Date(s) Administered   Fluad Quad(high Dose 65+) 11/02/2018, 10/11/2019, 10/24/2020, 10/08/2021   Fluad Trivalent(High Dose 65+) 10/09/2022   Influenza, High Dose Seasonal PF 10/06/2014, 10/05/2015, 10/26/2016, 09/23/2017   PFIZER(Purple Top)SARS-COV-2 Vaccination 02/23/2019, 03/16/2019   Pneumococcal Conjugate-13 04/28/2013   Pneumococcal Polysaccharide-23 10/26/2011    Screening Tests Health Maintenance  Topic Date Due   DTaP/Tdap/Td (1 - Tdap) Never done   Zoster Vaccines- Shingrix  (1 of 2) Never done   COVID-19 Vaccine (3 - Pfizer risk series) 04/13/2019   DEXA SCAN  08/02/2023   INFLUENZA VACCINE  08/22/2023   OPHTHALMOLOGY EXAM  09/09/2023   HEMOGLOBIN A1C  09/16/2023   Medicare Annual Wellness (AWV)  06/10/2024   Pneumonia Vaccine 58+ Years old  Completed   HPV VACCINES  Aged Out   Meningococcal B Vaccine  Aged Out    Health Maintenance  Health Maintenance Due  Topic Date Due   DTaP/Tdap/Td (1 - Tdap) Never done   Zoster Vaccines- Shingrix  (1 of 2) Never done   COVID-19 Vaccine (3 - Pfizer risk  series) 04/13/2019   Health Maintenance Items Addressed: NEEDS PNA SHOT; AGED OUT OF TESTING, DECLINES BONE DENSITY  Additional Screening:  Vision Screening: Recommended annual ophthalmology exams for early detection of glaucoma and other disorders of the eye.  Dental Screening: Recommended annual dental exams for proper oral hygiene  Community Resource Referral / Chronic Care Management: CRR required this visit?  No   CCM required this visit?  No   Plan:    I have personally reviewed and noted the following in the patient's chart:   Medical and social history Use of alcohol, tobacco or illicit drugs  Current medications and supplements including opioid prescriptions. Patient is not currently taking opioid prescriptions. Functional ability and status Nutritional status Physical activity Advanced directives List of other physicians Hospitalizations, surgeries, and ER visits in previous 12 months Vitals Screenings to include cognitive, depression, and falls Referrals and appointments  In addition, I have reviewed and discussed with patient certain preventive protocols, quality metrics, and best practice recommendations. A written personalized care plan for preventive services as well as general preventive health recommendations were provided to patient.  Pinky Bright, LPN   1/61/0960   After Visit Summary: (MyChart) Due to this being a telephonic visit, the after visit summary with patients personalized plan was offered to patient via MyChart   Notes: Nothing significant to report at this time.

## 2023-06-11 NOTE — Patient Instructions (Signed)
 Natalie Bray , Thank you for taking time out of your busy schedule to complete your Annual Wellness Visit with me. I enjoyed our conversation and look forward to speaking with you again next year. I, as well as your care team,  appreciate your ongoing commitment to your health goals. Please review the following plan we discussed and let me know if I can assist you in the future.  Follow up Visits: Next Medicare AWV with our clinical staff: 06/22/24 @ 8:10 AM BY PHONE   Have you seen your provider in the last 6 months (3 months if uncontrolled diabetes)? Yes Clinician Recommendations:  Aim for 30 minutes of exercise or brisk walking, 6-8 glasses of water, and 5 servings of fruits and vegetables each day. TAKE CARE!      This is a list of the screening recommended for you and due dates:  Health Maintenance  Topic Date Due   DTaP/Tdap/Td vaccine (1 - Tdap) Never done   Zoster (Shingles) Vaccine (1 of 2) Never done   COVID-19 Vaccine (3 - Pfizer risk series) 04/13/2019   DEXA scan (bone density measurement)  08/02/2023   Flu Shot  08/22/2023   Eye exam for diabetics  09/09/2023   Hemoglobin A1C  09/16/2023   Medicare Annual Wellness Visit  06/10/2024   Pneumonia Vaccine  Completed   HPV Vaccine  Aged Out   Meningitis B Vaccine  Aged Out    Advanced directives: (ACP Link)Information on Advanced Care Planning can be found at [place]  Print production planner Health Care Directives Advance Health Care Directives. [email or web address]  Advance Care Planning is important because it:  [x]  Makes sure you receive the medical care that is consistent with your values, goals, and preferences  [x]  It provides guidance to your family and loved ones and reduces their decisional burden about whether or not they are making the right decisions based on your wishes.  Follow the link provided in your after visit summary or read over the paperwork we have mailed to you to help you started getting your Advance  Directives in place. If you need assistance in completing these, please reach out to us  so that we can help you!

## 2023-07-18 ENCOUNTER — Telehealth: Payer: Self-pay

## 2023-07-18 ENCOUNTER — Ambulatory Visit

## 2023-07-18 DIAGNOSIS — M2141 Flat foot [pes planus] (acquired), right foot: Secondary | ICD-10-CM

## 2023-07-18 DIAGNOSIS — M76822 Posterior tibial tendinitis, left leg: Secondary | ICD-10-CM

## 2023-07-18 NOTE — Telephone Encounter (Signed)
 Patient called stating she wants lace up instead of velcro on her AFO brace.

## 2023-07-21 NOTE — Progress Notes (Signed)
 Patient was present and casted for new [place]  breeze AFO / sand color  Patient has PTTD that has caused severe over pronation with bony prominence of Navicular bone  Patient has Mezzo brace that was made is and dispensed in December of 2019 and is broken down and ill fit from wear and tear  New AFO will have a longer lever arm to help provide better support offload bony area more appropriately and will help reduce progression of greater deformity   Prior auth request sent to [facility] will place order upon approval   Lolita Schultze Cped, CFo, CFm

## 2023-07-22 ENCOUNTER — Telehealth: Payer: Self-pay

## 2023-07-22 NOTE — Telephone Encounter (Signed)
 Received message that patient wants to change style of brace most likely to mezzo brace like she currently has Recvd notice from [facility] that no auth is needed. Called and lm for patient to call back as we are now ready to order  Natalie Bray

## 2023-08-14 ENCOUNTER — Other Ambulatory Visit: Payer: Self-pay | Admitting: Family Medicine

## 2023-09-10 ENCOUNTER — Other Ambulatory Visit

## 2023-09-12 ENCOUNTER — Ambulatory Visit

## 2023-09-12 DIAGNOSIS — M2141 Flat foot [pes planus] (acquired), right foot: Secondary | ICD-10-CM

## 2023-09-12 DIAGNOSIS — M2142 Flat foot [pes planus] (acquired), left foot: Secondary | ICD-10-CM

## 2023-09-12 DIAGNOSIS — M76822 Posterior tibial tendinitis, left leg: Secondary | ICD-10-CM | POA: Diagnosis not present

## 2023-09-12 NOTE — Progress Notes (Signed)
 Patient was present and fit with new AFO breeze style patient states brace feels supportive and not rubbing on bony prominence  Patient may bring back if she can not get use to the height of brace as she came out of a Mezzo if we send back we would have them cut down about 3   Patient will call if any other problems arise

## 2023-09-16 ENCOUNTER — Ambulatory Visit: Payer: Medicare HMO | Admitting: Family Medicine

## 2023-09-18 ENCOUNTER — Other Ambulatory Visit: Payer: Self-pay | Admitting: Family Medicine

## 2023-09-18 NOTE — Telephone Encounter (Unsigned)
 Copied from CRM [id number]. Topic: Clinical - Medication Refill >> Sep 18, 2023  9:34 AM Carlatta H wrote: Medication: metFORMIN  (GLUCOPHAGE ) 1000 MG tablet  Has the patient contacted their pharmacy? No (Agent: If no, request that the patient contact the pharmacy for the refill. If patient does not wish to contact the pharmacy document the reason why and proceed with request.) (Agent: If yes, when and what did the pharmacy advise?)  This is the patient's preferred pharmacy:    [facility] - [place], [place] - [address] [place] [address] Phone: [phone] Fax: [phone]    Is this the correct pharmacy for this prescription? Yes If no, delete pharmacy and type the correct one.   Has the prescription been filled recently? No  Is the patient out of the medication? No  Has the patient been seen for an appointment in the last year OR does the patient have an upcoming appointment? Yes  Can we respond through MyChart? No  Agent: Please be advised that Rx refills may take up to 3 business days. We ask that you follow-up with your pharmacy.

## 2023-09-19 MED ORDER — METFORMIN HCL 1000 MG PO TABS: 1000.0000 mg | ORAL_TABLET | Freq: Two times a day (BID) | ORAL | 0 refills | Status: DC

## 2023-09-19 NOTE — Telephone Encounter (Signed)
 Requested Prescriptions  Pending Prescriptions Disp Refills   metFORMIN  (GLUCOPHAGE ) 1000 MG tablet 60 tablet 0    Sig: Take 1 tablet (1,000 mg total) by mouth 2 (two) times daily.     Endocrinology:  Diabetes - Biguanides Failed - 09/19/2023  3:13 PM      Failed - HBA1C is between 0 and 7.9 and within 180 days    Hemoglobin A1C  Date Value Ref Range Status  03/19/2023 6.4 (A) 4.0 - 5.6 % Final   Hgb A1c MFr Bld  Date Value Ref Range Status  09/13/2022 7.0 (H) 4.8 - 5.6 % Final    Comment:             Prediabetes: 5.7 - 6.4          Diabetes: >6.4          Glycemic control for adults with diabetes: <7.0          Failed - eGFR in normal range and within 360 days    EGFR (African American)  Date Value Ref Range Status  07/23/2011 >60  Final   GFR calc Af Amer  Date Value Ref Range Status  05/28/2019 69 >59 mL/min/1.73 Final    Comment:    **[facility] currently reports eGFR in compliance with the current**   recommendations of the [employer]. [facility] will   update reporting as new guidelines are published from the NKF-ASN   Task force.    EGFR (Non-African Amer.)  Date Value Ref Range Status  07/23/2011 >60  Final    Comment:    eGFR values <26mL/min/1.73 m2 may be an indication of chronic kidney disease (CKD). Calculated eGFR is useful in patients with stable renal function. The eGFR calculation will not be reliable in acutely ill patients when serum creatinine is changing rapidly. It is not useful in  patients on dialysis. The eGFR calculation may not be applicable to patients at the low and high extremes of body sizes, pregnant women, and vegetarians.    GFR calc non Af Amer  Date Value Ref Range Status  05/28/2019 60 >59 mL/min/1.73 Final   eGFR  Date Value Ref Range Status  05/07/2023 59 (L) >59 mL/min/1.73 Final         Failed - B12 Level in normal range and within 720 days    Vitamin B-12  Date Value Ref Range Status  02/12/2016 337 232  - 1,245 pg/mL Final         Failed - CBC within normal limits and completed in the last 12 months    WBC  Date Value Ref Range Status  05/07/2023 10.3 3.4 - 10.8 x10E3/uL Final  06/23/2018 8.1 4.0 - 10.5 K/uL Final   RBC  Date Value Ref Range Status  05/07/2023 4.06 3.77 - 5.28 x10E6/uL Final  06/23/2018 4.38 3.87 - 5.11 MIL/uL Final   Hemoglobin  Date Value Ref Range Status  05/07/2023 11.5 11.1 - 15.9 g/dL Final   Hematocrit  Date Value Ref Range Status  05/07/2023 35.7 34.0 - 46.6 % Final   MCHC  Date Value Ref Range Status  05/07/2023 32.2 31.5 - 35.7 g/dL Final  93/97/7979 67.7 30.0 - 36.0 g/dL Final   [facility]  Date Value Ref Range Status  05/07/2023 28.3 26.6 - 33.0 pg Final  06/23/2018 27.4 26.0 - 34.0 pg Final   MCV  Date Value Ref Range Status  05/07/2023 88 79 - 97 fL Final  07/16/2011 84 80 - 100 fL Final  No results found for: PLTCOUNTKUC, LABPLAT, POCPLA RDW  Date Value Ref Range Status  05/07/2023 13.3 11.7 - 15.4 % Final  07/16/2011 16.6 (H) 11.5 - 14.5 % Final         Passed - Cr in normal range and within 360 days    Creatinine  Date Value Ref Range Status  07/23/2011 0.59 (L) 0.60 - 1.30 mg/dL Final   Creatinine, Ser  Date Value Ref Range Status  05/07/2023 0.94 0.57 - 1.00 mg/dL Final   Creatinine, POC  Date Value Ref Range Status  09/11/2015 n/a mg/dL Final         Passed - Valid encounter within last 6 months    Recent Outpatient Visits           4 months ago Dizziness   [facility] [place] Nancyann BRAVO, MD   4 months ago Dyspnea on exertion   [facility] Nancyann BRAVO, MD   6 months ago Type 2 diabetes mellitus with stage 1 chronic kidney disease, without long-term current use of insulin (HCC)   [facility] Nancyann BRAVO, MD       Future Appointments             In 5 days Fisher, Nancyann BRAVO, MD [facility],  [place]

## 2023-09-24 ENCOUNTER — Encounter: Payer: Self-pay | Admitting: Family Medicine

## 2023-09-24 ENCOUNTER — Ambulatory Visit (INDEPENDENT_AMBULATORY_CARE_PROVIDER_SITE_OTHER): Admitting: Family Medicine

## 2023-09-24 VITALS — BP 110/54 | HR 84 | Ht 62.0 in | Wt 163.9 lb

## 2023-09-24 DIAGNOSIS — E782 Mixed hyperlipidemia: Secondary | ICD-10-CM

## 2023-09-24 DIAGNOSIS — E876 Hypokalemia: Secondary | ICD-10-CM | POA: Diagnosis not present

## 2023-09-24 DIAGNOSIS — R0609 Other forms of dyspnea: Secondary | ICD-10-CM | POA: Diagnosis not present

## 2023-09-24 DIAGNOSIS — I10 Essential (primary) hypertension: Secondary | ICD-10-CM | POA: Diagnosis not present

## 2023-09-24 DIAGNOSIS — I498 Other specified cardiac arrhythmias: Secondary | ICD-10-CM | POA: Diagnosis not present

## 2023-09-24 DIAGNOSIS — R002 Palpitations: Secondary | ICD-10-CM | POA: Diagnosis not present

## 2023-09-24 DIAGNOSIS — I472 Ventricular tachycardia, unspecified: Secondary | ICD-10-CM

## 2023-09-24 DIAGNOSIS — R053 Chronic cough: Secondary | ICD-10-CM | POA: Diagnosis not present

## 2023-09-24 DIAGNOSIS — N181 Chronic kidney disease, stage 1: Secondary | ICD-10-CM | POA: Diagnosis not present

## 2023-09-24 DIAGNOSIS — E1122 Type 2 diabetes mellitus with diabetic chronic kidney disease: Secondary | ICD-10-CM | POA: Diagnosis not present

## 2023-09-24 DIAGNOSIS — R008 Other abnormalities of heart beat: Secondary | ICD-10-CM

## 2023-09-24 NOTE — Progress Notes (Signed)
 Established patient visit   Patient: Natalie Bray   DOB: October 05, 1934   88 y.o. Female  MRN: [id number] Visit Date: 09/24/2023  Today's healthcare provider: Nancyann Perry, MD   Chief Complaint  Patient presents with   Medical Management of Chronic Issues    Patient was last seen 05/14/23. Reports taking medication as prescribed. Reports she has not heard anything in regards to her zio patch results that she worse in May.    Hypertension    She reports monitoring at home. No symptoms to report. No smoking.    Diabetes    She reports monitoring with reading anywhere from 80's - 150's. Episodes of hypoglycemia due to feeling weak or feels it in her stomach. Ophthalmology exam coming up on the 17th. She reports excessive urinating.    Immunizations    Influenza - not today    Subjective    Discussed the use of AI scribe software for clinical note transcription with the patient, who gave verbal consent to proceed.  History of Present Illness   Natalie Bray is an 89 year old female who presents with fatigue and coughing spells.  She experiences fatigue easily and feels generally unwell without a specific cause. Her blood pressure is lower than usual at 110 mmHg, whereas it typically runs around 150/45 mmHg. She feels better when her blood pressure is higher. No dizziness is reported.  She has a history of heart monitoring in May, which showed occasional episodes of ventricular ectopy, ventricular bigeminy, trigeminy and SVT . She has not followed up with a cardiologist since last year when she saw Dr. Perla  She experiences coughing spells several times a day, during which she feels like her breath 'just stops off' and makes her cough. She has not vomited but sometimes feels like she might. She had a chest x-ray in April and her oxygen levels have been checked. She reports feeling short of breath at times. No significant changes in her symptoms recently.        Medications: Outpatient Medications Prior to Visit  Medication Sig   acetaminophen  (TYLENOL ) 500 MG tablet Take 1,000 mg by mouth every 6 (six) hours as needed for moderate pain.   amLODipine (NORVASC) 10 MG tablet TAKE 1 TABLET EVERY DAY   aspirin 81 MG chewable tablet Chew 81 mg by mouth daily.   calcium-vitamin D  (OSCAL WITH D) 500-200 MG-UNIT tablet Take 1 tablet by mouth daily.   Cholecalciferol (VITAMIN D3) 50 MCG (2000 UT) TABS Take 1 tablet by mouth daily.   clobetasol  ointment (TEMOVATE ) 0.05 % Apply to affected areas twice daily, in the morning and at bedtime. Use regularly with symptoms, can taper and decrease when symptoms resolve.   docusate sodium (COLACE) 100 MG capsule Take 100 mg by mouth daily.   glipiZIDE  (GLUCOTROL  XL) 2.5 MG 24 hr tablet Take 1 tablet (2.5 mg total) by mouth daily with breakfast.   glucose blood (TRUE METRIX BLOOD GLUCOSE TEST) test strip TEST AS DIRECTED   lovastatin (MEVACOR) 40 MG tablet TAKE 1 TABLET EVERY DAY   metFORMIN  (GLUCOPHAGE ) 1000 MG tablet Take 1 tablet (1,000 mg total) by mouth 2 (two) times daily.   Omega-3 1000 MG CAPS Take 1,000 mg by mouth daily.   omeprazole (PRILOSEC) 20 MG capsule TAKE 1 CAPSULE EVERY DAY   spironolactone  (ALDACTONE ) 50 MG tablet TAKE 1 TABLET EVERY DAY   TRUEplus Lancets 33G MISC TEST BLOOD SUGAR EVERY DAY   valsartan -hydrochlorothiazide  (DIOVAN -HCT)  320-25 MG tablet TAKE 1 TABLET EVERY DAY   vitamin E 180 MG (400 UNITS) capsule Take 1 capsule by mouth daily.   No facility-administered medications prior to visit.   Review of Systems  Constitutional:  Negative for appetite change, chills, fatigue and fever.  Respiratory:  Negative for chest tightness and shortness of breath.   Cardiovascular:  Negative for chest pain and palpitations.  Gastrointestinal:  Negative for abdominal pain, nausea and vomiting.  Neurological:  Negative for dizziness and weakness.       Objective    BP (!) 110/54 (BP Location:  Left Arm, Patient Position: Sitting, Cuff Size: Large)   Pulse 84   Ht 5' 2 (1.575 m)   Wt 163 lb 14.4 oz (74.3 kg)   SpO2 99%   BMI 29.98 kg/m   Physical Exam   General: Appearance:    Well developed, well nourished female in no acute distress  Eyes:    PERRL, conjunctiva/corneas clear, EOM's intact       Lungs:     Clear to auscultation bilaterally, respirations unlabored  Heart:    Normal heart rate. Normal rhythm.  3/6 systolic murmur   MS:   All extremities are intact.    Neurologic:   Awake, alert, oriented x 3. No apparent focal neurological defect.         Assessment & Plan    1. Type 2 diabetes mellitus with stage 1 chronic kidney disease, without long-term current use of insulin (HCC) (Primary) Doing well current medications.  - Hemoglobin A1c  2. Primary hypertension Fairly well controlled, BP lower today than usual. Is due to follow up Dr. Gollan - CBC - Comprehensive metabolic panel with GFR - Lipid panel - Magnesium  3. Mixed hyperlipidemia She is tolerating lovastatin well with no adverse effects.    4. Chronic cough She desires evaluation by pulmonologist.  - Ambulatory referral to Pulmonology  5. Hypokalemia Check electrolytes today.   6. Dyspnea on exertion  - B Nat Peptide - Ambulatory referral to Cardiology  7. Ventricular bigeminy  - Ambulatory referral to Cardiology  8. Trigeminy  - Ambulatory referral to Cardiology  9. Ventricular tachycardia (HCC)  - Ambulatory referral to Cardiology  10. Palpitation  - Ambulatory referral to Cardiology      Nancyann Perry, MD  [facility] [phone] (phone) [phone] (fax)  [facility]

## 2023-09-24 NOTE — Patient Instructions (Signed)
 SABRA  Please review the attached list of medications and notify my office if there are any errors.   . Please bring all of your medications to every appointment so we can make sure that our medication list is the same as yours.

## 2023-09-25 LAB — CBC
Hematocrit: 37.1 % (ref 34.0–46.6)
Hemoglobin: 11.6 g/dL (ref 11.1–15.9)
MCH: 27.8 pg (ref 26.6–33.0)
MCHC: 31.3 g/dL — ABNORMAL LOW (ref 31.5–35.7)
MCV: 89 fL (ref 79–97)
Platelets: 423 x10E3/uL (ref 150–450)
RBC: 4.18 x10E6/uL (ref 3.77–5.28)
RDW: 14.6 % (ref 11.7–15.4)
WBC: 9.3 x10E3/uL (ref 3.4–10.8)

## 2023-09-25 LAB — BRAIN NATRIURETIC PEPTIDE: BNP: 36.5 pg/mL (ref 0.0–100.0)

## 2023-09-25 LAB — LIPID PANEL
Chol/HDL Ratio: 3.3 ratio (ref 0.0–4.4)
Cholesterol, Total: 187 mg/dL (ref 100–199)
HDL: 57 mg/dL (ref >39)
LDL Chol Calc (NIH): 72 mg/dL (ref 0–99)
Triglycerides: 373 mg/dL — ABNORMAL HIGH (ref 0–149)
VLDL Cholesterol Cal: 58 mg/dL — ABNORMAL HIGH (ref 5–40)

## 2023-09-25 LAB — COMPREHENSIVE METABOLIC PANEL WITH GFR
ALT: 16 IU/L (ref 0–32)
AST: 17 IU/L (ref 0–40)
Albumin: 4.4 g/dL (ref 3.7–4.7)
Alkaline Phosphatase: 89 IU/L (ref 44–121)
BUN/Creatinine Ratio: 16 (ref 12–28)
BUN: 21 mg/dL (ref 8–27)
Bilirubin Total: <0.2 mg/dL (ref 0.0–1.2)
CO2: 20 mmol/L (ref 20–29)
Calcium: 10.2 mg/dL (ref 8.7–10.3)
Chloride: 96 mmol/L (ref 96–106)
Creatinine, Ser: 1.29 mg/dL — ABNORMAL HIGH (ref 0.57–1.00)
Globulin, Total: 2.7 g/dL (ref 1.5–4.5)
Glucose: 87 mg/dL (ref 70–99)
Potassium: 5.1 mmol/L (ref 3.5–5.2)
Sodium: 132 mmol/L — ABNORMAL LOW (ref 134–144)
Total Protein: 7.1 g/dL (ref 6.0–8.5)
eGFR: 40 mL/min/1.73 — ABNORMAL LOW (ref >59)

## 2023-09-25 LAB — MAGNESIUM: Magnesium: 2 mg/dL (ref 1.6–2.3)

## 2023-09-25 LAB — HEMOGLOBIN A1C
Est. average glucose Bld gHb Est-mCnc: 137 mg/dL
Hgb A1c MFr Bld: 6.4 % — ABNORMAL HIGH (ref 4.8–5.6)

## 2023-09-26 ENCOUNTER — Ambulatory Visit: Payer: Self-pay | Admitting: Family Medicine

## 2023-09-27 NOTE — Progress Notes (Unsigned)
 Cardiology Clinic Note   Date: 09/29/2023 ID: LAMEKA DISLA, DOB 19-Feb-1934, MRN [id number]  Primary Cardiologist:  Evalene Lunger, MD  Chief Complaint   Natalie Bray is a 89 y.o. female who presents to the clinic today for evaluation of palpitations and dyspnea.   Patient Profile   Natalie Bray is followed by Dr. Gollan for the history outlined below.      Past medical history significant for: Palpitations. 7-day ZIO 05/27/2023: HR 54 to 235 bpm, average 76 bpm.  19 runs NSVT longest 9 beats.  9 runs SVT longest 38 seconds average rate 158 bpm, rhythm strip suggests A. tach.  Occasional PACs 1.5%, PVCs 2.2%.  No A-fib. Carotid artery stenosis. Hypertension. Hyperlipidemia. Lipid panel 09/24/2023: LDL 72, HDL 57, TG 373, total 187. GERD. T2DM. CKD.  In summary, patient was previously followed by Dr. Hester at [facility] cardiology.  She had a normal nuclear stress test in October 2022.  Echo demonstrated EF 55%, normal RV function, no valvular stenosis, mild MR/TR/PR.  She had a carotid ultrasound in November 2022 (results not available) reported as less than 50% stenosis bilaterally.  Patient establish care with Dr. Gollan on 05/27/2022.  She was doing well at the time of her visit and no changes were made.  Patient wore a 7-day ZIO ordered by PCP, Dr. Gasper, which demonstrated 19 runs of NSVT and 9 runs of SVT.     History of Present Illness    Today, patient reports DOE and exertional weakness that has been progressing over the last year or more but has worsened over the last few months. Dyspnea most notable with walking and cleaning her home. She was previously able to mop her whole house and now she has to stop and rest for about 20 minutes between rooms. Dyspnea is associated with palpitations described as heart racing as well as weakness. She denies chest pain, pressure or tightness. No lower extremity edema, orthopnea or PND. She reports occasional positional lightheadedness  with sit to stand. No presyncope or syncope. She checks her BP frequently at home and SBP typically in the 140s and occasionally in the 150s. She lives independently and takes care of all the inside household chores. Her son takes care of yard work.      ROS: All other systems reviewed and are otherwise negative except as noted in History of Present Illness.  EKGs/Labs Reviewed    EKG Interpretation Date/Time:  Monday September 29 2023 09:02:51 EDT Ventricular Rate:  99 PR Interval:  170 QRS Duration:  70 QT Interval:  318 QTC Calculation: 408 R Axis:   15  Text Interpretation: Sinus rhythm with occasional Premature ventricular complexes When compared with ECG of 05/07/2023 No significant change Confirmed by Loistine Sober [phone]) on 09/29/2023 9:06:04 AM   09/24/2023: ALT 16; AST 17; BUN 21; Creatinine, Ser 1.29; Potassium 5.1; Sodium 132   09/24/2023: Hemoglobin 11.6; WBC 9.3   05/07/2023: TSH 1.860   09/24/2023: BNP 36.5    Physical Exam    VS:  BP 130/60 (BP Location: Left Arm, Patient Position: Sitting, Cuff Size: Normal)   Pulse 99   Ht 5' 2 (1.575 m)   Wt 164 lb 9.6 oz (74.7 kg)   SpO2 98%   BMI 30.11 kg/m  , BMI Body mass index is 30.11 kg/m.  GEN: Well nourished, well developed, in no acute distress. Neck: No JVD or carotid bruits. Cardiac:  RRR. Occasional extrasystole.  No murmur. No rubs or gallops.  Respiratory:  Respirations regular and unlabored. Clear to auscultation without rales, wheezing or rhonchi. GI: Soft, nontender, nondistended. Extremities: Radials/DP/PT 2+ and equal bilaterally. No clubbing or cyanosis. No edema. Short brace on left foot/ankle.   Skin: Warm and dry, no rash. Neuro: Strength intact.  Assessment & Plan   Palpitations 7-day ZIO May 2025 demonstrated HR 54 to 235 bpm, average 76 bpm, 19 runs of NSVT longest 9 beats, 9 runs of SVT longest 38 seconds suggestive of A. tach, occasional PACs 1.5%, PVCs 2.2%.  No A-fib.  Patient reports  palpitations described as heart racing when she is exerting herself. EKG demonstrates sinus rhythm with PVCs HR 99 bpm.  - Add metoprolol  12.5 mg bid.   DOE/weakness Patient reports dyspnea with exertion and weakness that hs been progressing over the last year but worse over the last several months. Walking and cleaning her home exacerbate symptoms. She was previously able to mop her whole home now she has to rest for about 20 minutes between rooms. No lower extremity edema, orthopnea or PND.  - Schedule echo.  Carotid artery stenosis Per note review from [facility] cardiology carotid ultrasound November 2022 demonstrated <50% stenosis bilaterally.  Patient reports occasional lightheadedness with sit to stand. No presyncope or syncope.  - Continue aspirin, lovastatin. - Schedule carotid ultrasound.  Hypertension BP today 150/64 on intake and 130/60 on my recheck. She denies headaches. Positional lightheadedness as above.  - Continue amlodipine, Diovan . - Decrease spironolactone  to 25 mg daily.  - Add metoprolol  12.5 mg bid (see above).   Hyperlipidemia LDL 72, TG 373 September 2025. - Continue lovastatin.   Disposition: Schedule echo and carotid US . Decrease spironolactone  to 25 mg. Add metoprolol  12.5 mg bid. Return in 10-12 weeks or sooner as needed.          Signed, Barnie HERO. Tyce Delcid, DNP, NP-C

## 2023-09-29 ENCOUNTER — Ambulatory Visit: Attending: Student | Admitting: Student

## 2023-09-29 ENCOUNTER — Encounter: Payer: Self-pay | Admitting: Student

## 2023-09-29 VITALS — BP 130/60 | HR 99 | Ht 62.0 in | Wt 164.6 lb

## 2023-09-29 DIAGNOSIS — R002 Palpitations: Secondary | ICD-10-CM | POA: Diagnosis not present

## 2023-09-29 DIAGNOSIS — I6523 Occlusion and stenosis of bilateral carotid arteries: Secondary | ICD-10-CM | POA: Diagnosis not present

## 2023-09-29 DIAGNOSIS — E7800 Pure hypercholesterolemia, unspecified: Secondary | ICD-10-CM | POA: Diagnosis not present

## 2023-09-29 DIAGNOSIS — I10 Essential (primary) hypertension: Secondary | ICD-10-CM

## 2023-09-29 DIAGNOSIS — R531 Weakness: Secondary | ICD-10-CM

## 2023-09-29 DIAGNOSIS — R0609 Other forms of dyspnea: Secondary | ICD-10-CM

## 2023-09-29 MED ORDER — SPIRONOLACTONE 25 MG PO TABS: 25.0000 mg | ORAL_TABLET | Freq: Every day | ORAL | 3 refills | Status: AC

## 2023-09-29 MED ORDER — METOPROLOL TARTRATE 25 MG PO TABS: 12.5000 mg | ORAL_TABLET | Freq: Two times a day (BID) | ORAL | 3 refills | Status: DC

## 2023-09-29 NOTE — Patient Instructions (Signed)
 Your physician recommends the following medication changes.  START TAKING: Metoprolol  Tartrate 12.5 mg (1/2 of 25 mg tablet) 2 times daily  DECREASE: Spironolactone  25 mg once daily  *If you need a refill on your cardiac medications before your next appointment, please call your pharmacy*  Lab Work: None ordered at this time  If you have labs (blood work) drawn today and your tests are completely normal, you will receive your results only by: MyChart Message (if you have MyChart) OR A paper copy in the mail If you have any lab test that is abnormal or we need to change your treatment, we will call you to review the results.  Testing/Procedures: Your physician has requested that you have an echocardiogram. Echocardiography is a painless test that uses sound waves to create images of your heart. It provides your doctor with information about the size and shape of your heart and how well your heart's chambers and valves are working.   You may receive an ultrasound enhancing agent through an IV if needed to better visualize your heart during the echo. This procedure takes approximately one hour.  There are no restrictions for this procedure.  This will take place at [address] [facility] [address], [place] [address]  Please note: We ask at that you not bring children with you during ultrasound (echo/ vascular) testing. Due to room size and safety concerns, children are not allowed in the ultrasound rooms during exams. Our front office staff cannot provide observation of children in our lobby area while testing is being conducted. An adult accompanying a patient to their appointment will only be allowed in the ultrasound room at the discretion of the ultrasound technician under special circumstances. We apologize for any inconvenience.   Your physician has requested that you have a Carotid Duplex. This test is an ultrasound of the carotid arteries in your neck. It looks at  blood flow through these arteries that supply the brain with blood.   Allow one hour for this exam.  There are no restrictions or special instructions.  This will take place at [address] [facility] [address] [facility], [place] [address]  Please note: We ask at that you not bring children with you during ultrasound (echo/ vascular) testing. Due to room size and safety concerns, children are not allowed in the ultrasound rooms during exams. Our front office staff cannot provide observation of children in our lobby area while testing is being conducted. An adult accompanying a patient to their appointment will only be allowed in the ultrasound room at the discretion of the ultrasound technician under special circumstances. We apologize for any inconvenience.   Follow-Up: At [facility], you and your health needs are our priority.  As part of our continuing mission to provide you with exceptional heart care, our providers are all part of one team.  This team includes your primary Cardiologist (physician) and Advanced Practice Providers or APPs (Physician Assistants and Nurse Practitioners) who all work together to provide you with the care you need, when you need it.  Your next appointment:   10-12 week(s)  Provider:   Evalene Lunger, MD or Barnie Hila, NP    We recommend signing up for the patient portal called MyChart.  Sign up information is provided on this After Visit Summary.  MyChart is used to connect with patients for Virtual Visits (Telemedicine).  Patients are able to view lab/test results, encounter notes, upcoming appointments, etc.  Non-urgent messages can be  sent to your provider as well.   To learn more about what you can do with MyChart, go to [email or web address].

## 2023-10-07 ENCOUNTER — Ambulatory Visit (INDEPENDENT_AMBULATORY_CARE_PROVIDER_SITE_OTHER)

## 2023-10-07 DIAGNOSIS — Z23 Encounter for immunization: Secondary | ICD-10-CM

## 2023-10-08 DIAGNOSIS — Z961 Presence of intraocular lens: Secondary | ICD-10-CM | POA: Diagnosis not present

## 2023-10-08 DIAGNOSIS — E119 Type 2 diabetes mellitus without complications: Secondary | ICD-10-CM | POA: Diagnosis not present

## 2023-10-08 LAB — HM DIABETES EYE EXAM

## 2023-10-09 DIAGNOSIS — Z961 Presence of intraocular lens: Secondary | ICD-10-CM | POA: Diagnosis not present

## 2023-10-09 DIAGNOSIS — E119 Type 2 diabetes mellitus without complications: Secondary | ICD-10-CM | POA: Diagnosis not present

## 2023-10-13 DIAGNOSIS — G8929 Other chronic pain: Secondary | ICD-10-CM | POA: Diagnosis not present

## 2023-10-13 DIAGNOSIS — M47816 Spondylosis without myelopathy or radiculopathy, lumbar region: Secondary | ICD-10-CM | POA: Diagnosis not present

## 2023-10-13 DIAGNOSIS — M545 Low back pain, unspecified: Secondary | ICD-10-CM | POA: Diagnosis not present

## 2023-10-24 ENCOUNTER — Ambulatory Visit

## 2023-10-27 ENCOUNTER — Encounter: Payer: Self-pay | Admitting: Student in an Organized Health Care Education/Training Program

## 2023-10-27 ENCOUNTER — Ambulatory Visit: Admitting: Student in an Organized Health Care Education/Training Program

## 2023-10-27 ENCOUNTER — Other Ambulatory Visit: Payer: Self-pay | Admitting: Family Medicine

## 2023-10-27 VITALS — BP 120/72 | HR 68 | Temp 97.3°F | Ht 62.0 in | Wt 164.8 lb

## 2023-10-27 DIAGNOSIS — R053 Chronic cough: Secondary | ICD-10-CM | POA: Diagnosis not present

## 2023-10-27 LAB — NITRIC OXIDE: Nitric Oxide: 6

## 2023-10-27 MED ORDER — LORATADINE 10 MG PO TABS: 10.0000 mg | ORAL_TABLET | Freq: Every day | ORAL | 3 refills | Status: AC

## 2023-10-27 NOTE — Progress Notes (Signed)
 Patient likes brace but would like it cut shorter down to metal and possibly padded at navicular prominence  Will send to [place]  and have modified   Lolita Schultze

## 2023-10-27 NOTE — Progress Notes (Signed)
 Assessment & Plan:   #Chronic cough (Primary)  Presents for the evaluation of chronic cough which I suspect is either secondary to upper airway cough syndrome or reflux associated cough (given report of reflux on PPI). Her exam showed clear lungs but with some boggy nasal mucosa.  I will empirically treat her with antihistamines with loratadine and provided her with information regarding GERD consistent diet.  I will complete the workup with a pulmonary function test and a CT scan of the chest.  - Nitric oxide within normal - Pulmonary Function Test; Future - CT CHEST WO CONTRAST; Future - loratadine (CLARITIN) 10 MG tablet; Take 1 tablet (10 mg total) by mouth daily.  Dispense: 90 tablet; Refill: 3   Return in about 3 months (around 01/27/2024).  I spent 60 minutes caring for this patient today, including preparing to see the patient, obtaining a medical history , reviewing a separately obtained history, performing a medically appropriate examination and/or evaluation, counseling and educating the patient/family/caregiver, ordering medications, tests, or procedures, documenting clinical information in the electronic health record, and independently interpreting results (not separately reported/billed) and communicating results to the patient/family/caregiver  Belva November, MD [facility]   End of visit medications:  Meds ordered this encounter  Medications   loratadine (CLARITIN) 10 MG tablet    Sig: Take 1 tablet (10 mg total) by mouth daily.    Dispense:  90 tablet    Refill:  3     Current Outpatient Medications:    acetaminophen  (TYLENOL ) 500 MG tablet, Take 1,000 mg by mouth every 6 (six) hours as needed for moderate pain., Disp: , Rfl:    amLODipine (NORVASC) 10 MG tablet, TAKE 1 TABLET EVERY DAY, Disp: 90 tablet, Rfl: 3   aspirin 81 MG chewable tablet, Chew 81 mg by mouth daily., Disp: , Rfl:    calcium-vitamin D  (OSCAL WITH D) 500-200 MG-UNIT tablet,  Take 1 tablet by mouth daily., Disp: , Rfl:    Cholecalciferol (VITAMIN D3) 50 MCG (2000 UT) TABS, Take 1 tablet by mouth daily., Disp: , Rfl:    clobetasol  ointment (TEMOVATE ) 0.05 %, Apply to affected areas twice daily, in the morning and at bedtime. Use regularly with symptoms, can taper and decrease when symptoms resolve., Disp: 60 g, Rfl: 3   docusate sodium (COLACE) 100 MG capsule, Take 100 mg by mouth daily., Disp: , Rfl:    glipiZIDE  (GLUCOTROL  XL) 2.5 MG 24 hr tablet, Take 1 tablet (2.5 mg total) by mouth daily with breakfast., Disp: 90 tablet, Rfl: 3   glucose blood (TRUE METRIX BLOOD GLUCOSE TEST) test strip, TEST AS DIRECTED, Disp: 100 strip, Rfl: 3   loratadine (CLARITIN) 10 MG tablet, Take 1 tablet (10 mg total) by mouth daily., Disp: 90 tablet, Rfl: 3   lovastatin (MEVACOR) 40 MG tablet, TAKE 1 TABLET EVERY DAY, Disp: 90 tablet, Rfl: 3   meloxicam (MOBIC) 15 MG tablet, Take 15 mg by mouth daily., Disp: , Rfl:    metFORMIN  (GLUCOPHAGE ) 1000 MG tablet, Take 1 tablet (1,000 mg total) by mouth 2 (two) times daily. (Patient taking differently: Take 1,000 mg by mouth daily with breakfast.), Disp: 60 tablet, Rfl: 0   metoprolol  tartrate (LOPRESSOR ) 25 MG tablet, Take 0.5 tablets (12.5 mg total) by mouth 2 (two) times daily., Disp: 90 tablet, Rfl: 3   Omega-3 1000 MG CAPS, Take 1,000 mg by mouth daily., Disp: , Rfl:    omeprazole (PRILOSEC) 20 MG capsule, TAKE 1 CAPSULE EVERY DAY,  Disp: 90 capsule, Rfl: 2   spironolactone  (ALDACTONE ) 25 MG tablet, Take 1 tablet (25 mg total) by mouth daily., Disp: 90 tablet, Rfl: 3   TRUEplus Lancets 33G MISC, TEST BLOOD SUGAR EVERY DAY, Disp: 100 each, Rfl: 3   valsartan -hydrochlorothiazide  (DIOVAN -HCT) 320-25 MG tablet, TAKE 1 TABLET EVERY DAY, Disp: 90 tablet, Rfl: 3   vitamin E 180 MG (400 UNITS) capsule, Take 1 capsule by mouth daily., Disp: , Rfl:    Subjective:   PATIENT ID: Natalie Bray GENDER: female DOB: 17-Dec-1934, MRN: [id number]  Chief  Complaint  Patient presents with   Cough    Dry cough for 6 months. DOE. No wheezing.     HPI  Patient is a pleasant 89 year old female with a past medical history of hypertension and diabetes who presents to clinic for the evaluation of a cough.  She reports symptom onset around 6 to 8 months ago.  She developed a cough that has been sporadic and happens in fits that leave her breathless.  The cough is dry and nonproductive without any other associated symptoms.  Specifically, she denies any wheezing, chest pain, chest tightness.  She has not had any fevers, chills, night sweats, or weight loss.  She has not had any hemoptysis.  The cough happens sporadically throughout the day and at night, without predilection for any time of day.  She has not noticed that the cough is worse after meals.  Reviewed her diet, she does enjoy drinking a cup of hot chocolate every night.  She does not drink coffee or carbonated beverages.  She has not had similar symptoms in the past nor has she suffered from any respiratory symptoms growing up.  She does not have any personal history of asthma.  Patient previously worked at a 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  She denies any smoking.  She is from [place] .  Ancillary information including prior medications, full medical/surgical/family/social histories, and PFTs (when available) are listed below and have been reviewed.    Review of Systems  Constitutional:  Negative for chills, fever and weight loss.  Respiratory:  Positive for cough. Negative for hemoptysis, sputum production, shortness of breath and wheezing.   Cardiovascular:  Negative for chest pain.     Objective:   Vitals:   10/27/23 0929  BP: 120/72  Pulse: 68  Temp: (!) 97.3 F (36.3 C)  SpO2: 98%  Weight: 164 lb 12.8 oz (74.8 kg)  Height: 5' 2 (1.575 m)   98% on RA BMI Readings from Last 3 Encounters:  10/27/23 30.14 kg/m  09/29/23 30.11  kg/m  09/24/23 29.98 kg/m   Wt Readings from Last 3 Encounters:  10/27/23 164 lb 12.8 oz (74.8 kg)  09/29/23 164 lb 9.6 oz (74.7 kg)  09/24/23 163 lb 14.4 oz (74.3 kg)    Physical Exam Constitutional:      Appearance: Normal appearance. She is not ill-appearing.  HENT:     Head: Normocephalic and atraumatic.     Mouth/Throat:     Mouth: Mucous membranes are moist.  Cardiovascular:     Rate and Rhythm: Normal rate and regular rhythm.     Pulses: Normal pulses.     Heart sounds: Normal heart sounds.  Pulmonary:     Effort: Pulmonary effort is normal.     Breath sounds: Normal breath sounds.  Abdominal:     Tenderness: There is no abdominal tenderness.  Neurological:     General:  No focal deficit present.     Mental Status: She is alert and oriented to person, place, and time. Mental status is at baseline.       Ancillary Information    Past Medical History:  Diagnosis Date   Anemia    Anemia, iron deficiency 08/08/2014   Anxiety    Diabetes mellitus without complication (HCC)    GERD (gastroesophageal reflux disease)    History of chicken pox    History of skin cancer    Hyperlipidemia    Hypertension    Skin cancer      Family History  Problem Relation Age of Onset   Kidney failure Mother    Aneurysm Father    Hypertension Sister    Diabetes Sister    Diabetes Brother    Hypertension Brother    Diabetes Sister    Hypertension Sister    Diabetes Brother    Hypertension Brother      Past Surgical History:  Procedure Laterality Date   ABDOMINAL HYSTERECTOMY     CARPAL TUNNEL RELEASE Right 02/21/2021   Procedure: CARPAL TUNNEL RELEASE ENDOSCOPIC;  Surgeon: Edie Norleen PARAS, MD;  Location: [facility] ORS;  Service: Orthopedics;  Laterality: Right;   CATARACT EXTRACTION Bilateral    Prolapsed bladder  03/2013   Dr. Alvia   REPLACEMENT TOTAL KNEE Left 01/2011   [facility]   REPLACEMENT TOTAL KNEE Right 03/2010   TOTAL ABDOMINAL HYSTERECTOMY W/ BILATERAL  SALPINGOOPHORECTOMY  1974   [facility]    Social History   Socioeconomic History   Marital status: Widowed    Spouse name: Not on file   Number of children: 2   Years of education: 12   Highest education level: 12th grade  Occupational History   Occupation: retired  Tobacco Use   Smoking status: Never   Smokeless tobacco: Never  Vaping Use   Vaping status: Never Used  Substance and Sexual Activity   Alcohol use: No    Alcohol/week: 0.0 standard drinks of alcohol   Drug use: No   Sexual activity: Not Currently  Other Topics Concern   Not on file  Social History Narrative   Not on file   Social Drivers of Health   Financial Resource Strain: Low Risk  (06/11/2023)   Overall Financial Resource Strain (CARDIA)    Difficulty of Paying Living Expenses: Not hard at all  Food Insecurity: No Food Insecurity (06/11/2023)   Hunger Vital Sign    Worried About Running Out of Food in the Last Year: Never true    Ran Out of Food in the Last Year: Never true  Transportation Needs: No Transportation Needs (06/11/2023)   PRAPARE - Administrator, Civil Service (Medical): No    Lack of Transportation (Non-Medical): No  Physical Activity: Insufficiently Active (06/11/2023)   Exercise Vital Sign    Days of Exercise per Week: 2 days    Minutes of Exercise per Session: 20 min  Stress: No Stress Concern Present (06/11/2023)   [employer] of Occupational Health - Occupational Stress Questionnaire    Feeling of Stress : Not at all  Social Connections: Moderately Isolated (06/11/2023)   Social Connection and Isolation Panel    Frequency of Communication with Friends and Family: More than three times a week    Frequency of Social Gatherings with Friends and Family: Twice a week    Attends Religious Services: More than 4 times per year    Active Member of Clubs or Organizations: No  Attends Banker Meetings: Never    Marital Status: Widowed  Intimate Partner Violence:  Not At Risk (06/11/2023)   Humiliation, Afraid, Rape, and Kick questionnaire    Fear of Current or Ex-Partner: No    Emotionally Abused: No    Physically Abused: No    Sexually Abused: No     Allergies  Allergen Reactions   Alendronate  Itching    Headaches   Betadine [Povidone Iodine] Rash   Celecoxib Rash and Other (See Comments)    Flushed red   Povidone-Iodine Rash     CBC    Component Value Date/Time   WBC 9.3 09/24/2023 1343   WBC 8.1 06/23/2018 1406   RBC 4.18 09/24/2023 1343   RBC 4.38 06/23/2018 1406   HGB 11.6 09/24/2023 1343   HCT 37.1 09/24/2023 1343   PLT 423 09/24/2023 1343   MCV 89 09/24/2023 1343   MCV 84 07/16/2011 1056   MCH 27.8 09/24/2023 1343   MCH 27.4 06/23/2018 1406   MCHC 31.3 (L) 09/24/2023 1343   MCHC 32.2 06/23/2018 1406   RDW 14.6 09/24/2023 1343   RDW 16.6 (H) 07/16/2011 1056    Pulmonary Functions Testing Results:     No data to display          Outpatient Medications Prior to Visit  Medication Sig Dispense Refill   acetaminophen  (TYLENOL ) 500 MG tablet Take 1,000 mg by mouth every 6 (six) hours as needed for moderate pain.     amLODipine (NORVASC) 10 MG tablet TAKE 1 TABLET EVERY DAY 90 tablet 3   aspirin 81 MG chewable tablet Chew 81 mg by mouth daily.     calcium-vitamin D  (OSCAL WITH D) 500-200 MG-UNIT tablet Take 1 tablet by mouth daily.     Cholecalciferol (VITAMIN D3) 50 MCG (2000 UT) TABS Take 1 tablet by mouth daily.     clobetasol  ointment (TEMOVATE ) 0.05 % Apply to affected areas twice daily, in the morning and at bedtime. Use regularly with symptoms, can taper and decrease when symptoms resolve. 60 g 3   docusate sodium (COLACE) 100 MG capsule Take 100 mg by mouth daily.     glipiZIDE  (GLUCOTROL  XL) 2.5 MG 24 hr tablet Take 1 tablet (2.5 mg total) by mouth daily with breakfast. 90 tablet 3   glucose blood (TRUE METRIX BLOOD GLUCOSE TEST) test strip TEST AS DIRECTED 100 strip 3   lovastatin (MEVACOR) 40 MG tablet TAKE 1  TABLET EVERY DAY 90 tablet 3   meloxicam (MOBIC) 15 MG tablet Take 15 mg by mouth daily.     metFORMIN  (GLUCOPHAGE ) 1000 MG tablet Take 1 tablet (1,000 mg total) by mouth 2 (two) times daily. (Patient taking differently: Take 1,000 mg by mouth daily with breakfast.) 60 tablet 0   metoprolol  tartrate (LOPRESSOR ) 25 MG tablet Take 0.5 tablets (12.5 mg total) by mouth 2 (two) times daily. 90 tablet 3   Omega-3 1000 MG CAPS Take 1,000 mg by mouth daily.     omeprazole (PRILOSEC) 20 MG capsule TAKE 1 CAPSULE EVERY DAY 90 capsule 2   spironolactone  (ALDACTONE ) 25 MG tablet Take 1 tablet (25 mg total) by mouth daily. 90 tablet 3   TRUEplus Lancets 33G MISC TEST BLOOD SUGAR EVERY DAY 100 each 3   valsartan -hydrochlorothiazide  (DIOVAN -HCT) 320-25 MG tablet TAKE 1 TABLET EVERY DAY 90 tablet 3   vitamin E 180 MG (400 UNITS) capsule Take 1 capsule by mouth daily.     No facility-administered medications prior to visit.

## 2023-11-03 NOTE — Progress Notes (Signed)
    GYNECOLOGY PROGRESS NOTE  Subjective:  PCP: Gasper Nancyann BRAVO, MD  Patient ID: Natalie Bray, female    DOB: 1934-05-31, 89 y.o.   MRN: [id number]  HPI  Patient is a 89 y.o. G1P2002 female who presents for follow-up of lichen sclerosis.  She would like to know if there is a cream she can use that will clear it up once and for all so that she doesn't have to continue using.  She says the clobetasol  and coconut oil help temporarily, but the itching and burning always comes back once she stops.  She denies any vulvar changes since last visit in April.  The following portions of the patient's history were reviewed and updated as appropriate: allergies, current medications, past family history, past medical history, past social history, past surgical history, and problem list.  Review of Systems Pertinent items are noted in HPI.   Objective:   Blood pressure (!) 143/75, pulse 77, weight 164 lb (74.4 kg). Body mass index is 30 kg/m.  General appearance: alert and cooperative Abdomen: soft, non-tender; bowel sounds normal; no masses,  no organomegaly Pelvic: uterus surgically absent and external genitalia with mild lichenification of bilateral labia. No fissures or ulcers. Vagina without discharge, atrophic.  Extremities: extremities normal, atraumatic, no cyanosis or edema Neurologic: Grossly normal   Assessment/Plan:   1. Lichen sclerosus et atrophicus of the vulva   Again had a long discussion about lifelong nature of LS. During our visit together Jan '25 we also discussed this (see note/plan) and pt handouts were provided. Reiterated how LS can wax and wane, and pt may need to continue prn treatment indefinitely. She seemed surprised by this and did not recall this conversation previously. Some concern for her memory and retention which her PCP can address. Reminders and new handouts provided today.   -Clobetasol  refilled to [facility] at [facility] request, [employer] too expensive.   Follow up 89yr for LS check, sooner prn.    Estil Mangle, DO [facility]

## 2023-11-05 ENCOUNTER — Encounter: Payer: Self-pay | Admitting: Obstetrics

## 2023-11-05 ENCOUNTER — Ambulatory Visit: Admitting: Obstetrics

## 2023-11-05 ENCOUNTER — Telehealth: Payer: Self-pay | Admitting: Family Medicine

## 2023-11-05 ENCOUNTER — Other Ambulatory Visit: Payer: Self-pay

## 2023-11-05 VITALS — BP 143/75 | HR 77 | Wt 164.0 lb

## 2023-11-05 DIAGNOSIS — N904 Leukoplakia of vulva: Secondary | ICD-10-CM

## 2023-11-05 DIAGNOSIS — E1122 Type 2 diabetes mellitus with diabetic chronic kidney disease: Secondary | ICD-10-CM

## 2023-11-05 MED ORDER — METFORMIN HCL 1000 MG PO TABS: 1000.0000 mg | ORAL_TABLET | Freq: Two times a day (BID) | ORAL | 3 refills | Status: DC

## 2023-11-05 NOTE — Telephone Encounter (Signed)
 Converted into a refill request

## 2023-11-05 NOTE — Telephone Encounter (Signed)
[facility] faxed refill request for the following medications: ° °metFORMIN (GLUCOPHAGE) 1000 MG tablet  ° °Please advise. ° °

## 2023-11-10 MED ORDER — CLOBETASOL PROPIONATE 0.05 % EX OINT: TOPICAL_OINTMENT | CUTANEOUS | 3 refills | Status: AC

## 2023-11-11 DIAGNOSIS — C44319 Basal cell carcinoma of skin of other parts of face: Secondary | ICD-10-CM | POA: Diagnosis not present

## 2023-11-11 DIAGNOSIS — L570 Actinic keratosis: Secondary | ICD-10-CM | POA: Diagnosis not present

## 2023-11-11 DIAGNOSIS — D2271 Melanocytic nevi of right lower limb, including hip: Secondary | ICD-10-CM | POA: Diagnosis not present

## 2023-11-11 DIAGNOSIS — D0462 Carcinoma in situ of skin of left upper limb, including shoulder: Secondary | ICD-10-CM | POA: Diagnosis not present

## 2023-11-11 DIAGNOSIS — D485 Neoplasm of uncertain behavior of skin: Secondary | ICD-10-CM | POA: Diagnosis not present

## 2023-11-11 DIAGNOSIS — D2261 Melanocytic nevi of right upper limb, including shoulder: Secondary | ICD-10-CM | POA: Diagnosis not present

## 2023-11-11 DIAGNOSIS — Z85828 Personal history of other malignant neoplasm of skin: Secondary | ICD-10-CM | POA: Diagnosis not present

## 2023-11-11 DIAGNOSIS — C44329 Squamous cell carcinoma of skin of other parts of face: Secondary | ICD-10-CM | POA: Diagnosis not present

## 2023-11-11 DIAGNOSIS — D2272 Melanocytic nevi of left lower limb, including hip: Secondary | ICD-10-CM | POA: Diagnosis not present

## 2023-11-11 DIAGNOSIS — D2262 Melanocytic nevi of left upper limb, including shoulder: Secondary | ICD-10-CM | POA: Diagnosis not present

## 2023-11-13 ENCOUNTER — Telehealth: Payer: Self-pay

## 2023-11-13 NOTE — Telephone Encounter (Signed)
 Confirmed with patient, request to cancel appt. Called [facility], appt canceled. NFN.

## 2023-11-13 NOTE — Telephone Encounter (Signed)
 Copied from CRM [id number]. Topic: Appointments - Appointment Cancel/Reschedule >> Nov 13, 2023 10:47 AM Natalie Bray wrote: Patient/patient representative is calling to cancel or reschedule an appointment. Refer to attachments for appointment information.    Patient wants to cancel - she states she thinks she doesn't need it as she feels better, states she hasn't been coughing since she started taking Claritin  Jan 27 2024 10:30 AM - Ct Chest Wo Contrast [facility] - [facility] [facility] 1

## 2023-11-14 ENCOUNTER — Ambulatory Visit: Attending: Student

## 2023-11-14 ENCOUNTER — Encounter

## 2023-11-14 DIAGNOSIS — R0609 Other forms of dyspnea: Secondary | ICD-10-CM

## 2023-11-14 LAB — ECHOCARDIOGRAM COMPLETE
AR max vel: 1.71 cm2
AV Area VTI: 1.89 cm2
AV Area mean vel: 1.75 cm2
AV Mean grad: 5 mmHg
AV Peak grad: 9 mmHg
Ao pk vel: 1.5 m/s
Area-P 1/2: 3.08 cm2
S' Lateral: 3.2 cm

## 2023-11-15 ENCOUNTER — Ambulatory Visit: Payer: Self-pay | Admitting: Student

## 2023-11-19 ENCOUNTER — Telehealth: Payer: Self-pay | Admitting: Cardiovascular Disease

## 2023-11-19 DIAGNOSIS — M47816 Spondylosis without myelopathy or radiculopathy, lumbar region: Secondary | ICD-10-CM | POA: Diagnosis not present

## 2023-11-19 DIAGNOSIS — M51361 Other intervertebral disc degeneration, lumbar region with lower extremity pain only: Secondary | ICD-10-CM | POA: Diagnosis not present

## 2023-11-19 DIAGNOSIS — G8929 Other chronic pain: Secondary | ICD-10-CM | POA: Diagnosis not present

## 2023-11-19 DIAGNOSIS — M545 Low back pain, unspecified: Secondary | ICD-10-CM | POA: Diagnosis not present

## 2023-11-19 NOTE — Telephone Encounter (Signed)
 Pt c/o medication issue:  1. Name of Medication:   spironolactone  (ALDACTONE ) 25 MG tablet  metoprolol  tartrate (LOPRESSOR ) 25 MG tablet   2. How are you currently taking this medication (dosage and times per day)?   3. Are you having a reaction (difficulty breathing--STAT)?   4. What is your medication issue?   Patient wants a call back regarding the changes to these medications and noted her BP has been trending high over night (189/85  HR 101).

## 2023-11-20 MED ORDER — METOPROLOL TARTRATE 25 MG PO TABS: 12.5000 mg | ORAL_TABLET | Freq: Two times a day (BID) | ORAL | 3 refills | Status: DC

## 2023-11-20 NOTE — Telephone Encounter (Signed)
 Called [facility] to clarify sig

## 2023-11-20 NOTE — Telephone Encounter (Signed)
 Patient is concerned that her BP and heart rate are increased when she wakes up during the night - asked if she should take 1/2 tablet of metoprolol  in the morning and a whole tablet at night  Please advise

## 2023-11-20 NOTE — Telephone Encounter (Signed)
 [facility] mail order pharmacy  requesting clarification for medication metoprolol  tartrate 25 mg tablet. Should pt be taking 1/2 tablet BID or taking 1/2 tablet QAM and 1 tablet QPM? Please clarify how pt is supposed to be taking medication.  Ph# [phone]   Reference # [id number]

## 2023-11-20 NOTE — Telephone Encounter (Signed)
 Sent increased dosage RX to pharmacy

## 2023-11-22 ENCOUNTER — Other Ambulatory Visit: Payer: Self-pay | Admitting: Family Medicine

## 2023-11-24 ENCOUNTER — Other Ambulatory Visit: Payer: Self-pay | Admitting: Family Medicine

## 2023-11-24 ENCOUNTER — Telehealth: Payer: Self-pay | Admitting: Family Medicine

## 2023-11-24 DIAGNOSIS — E1122 Type 2 diabetes mellitus with diabetic chronic kidney disease: Secondary | ICD-10-CM

## 2023-11-24 NOTE — Telephone Encounter (Signed)
[facility] faxed refill request for the following medications: ° °metFORMIN (GLUCOPHAGE) 1000 MG tablet  ° °Please advise. ° °

## 2023-12-01 ENCOUNTER — Encounter: Payer: Self-pay | Admitting: Emergency Medicine

## 2023-12-01 NOTE — Progress Notes (Signed)
 Letter sent

## 2023-12-05 ENCOUNTER — Ambulatory Visit

## 2023-12-05 NOTE — Progress Notes (Signed)
 Patient was present and re-fit with AFO that was modified patient was happy with fit and function  Added pads around posterior area of brace to reduce pressure off skin and as well as bony navicular area, [place]  cut height of brace down approx 3/4 inch   Patient will call if any other issues arise   Lolita Schultze Cped, CFo, CFm

## 2023-12-08 NOTE — Progress Notes (Unsigned)
 Cardiology Office Note  Date:  12/09/2023   ID:  Natalie Bray, DOB Jul 21, 1934, MRN [id number]  PCP:  Natalie Nancyann BRAVO, MD   Chief Complaint  Patient presents with   Follow up Echo results    Patient c/o shortness of breath this am with little to no activity with occasional twinges in chest.     HPI:  [name] is a 89 year old woman with past medical history of Hypertension PAD, bilateral carotid artery stenosis less than 50% bilaterally 2022  Diabetes Hyperlipidemia on lovastatin Who presents for follow-up of her hypertension, bilateral carotid stenosis  LOV 5/24 Seen by one of our providers September 29, 2023  In follow-up blood pressure at home running 120 up to 140, rare 150 systolic  Having palpitations in the evenings would like extra metoprolol  dosing  Tolerating amlodipine, valsartan  HCT, spironolactone  Denies significant leg swelling  Previously used clonidine  0.1 daily PRN for SBP >180, for  anxiety, has not had to take it  Lab work reviewed A1C 6.4 Total chol 187, LDL 72  EKG personally reviewed by myself on todays visit EKG Interpretation Date/Time:  Tuesday December 09 2023 10:25:13 EST Ventricular Rate:  74 PR Interval:  154 QRS Duration:  72 QT Interval:  366 QTC Calculation: 406 R Axis:   13  Text Interpretation: Normal sinus rhythm Normal ECG When compared with ECG of 29-Sep-2023 09:02, Premature ventricular complexes are no longer Present Confirmed by Perla Lye [phone]) on 12/09/2023 10:44:28 AM    Can't walk very far, legs weaker Denies chest pain or shortness of breath, no leg swelling  Last carotid u/s 11/22, results not available ([facility]) Notes indicating less than 50% disease  Echo 11/22 NORMAL LEFT VENTRICULAR SYSTOLIC FUNCTION  NORMAL RIGHT VENTRICULAR SYSTOLIC FUNCTION  NO VALVULAR STENOSIS  MILD MR, TR, PR  EF 55%   Myoview 11/22: no ischemia   PMH:   has a past medical history of Anemia, Anemia, iron deficiency  (08/08/2014), Anxiety, Diabetes mellitus without complication (HCC), GERD (gastroesophageal reflux disease), History of chicken pox, History of skin cancer, Hyperlipidemia, Hypertension, and Skin cancer.  PSH:    Past Surgical History:  Procedure Laterality Date   ABDOMINAL HYSTERECTOMY     CARPAL TUNNEL RELEASE Right 02/21/2021   Procedure: CARPAL TUNNEL RELEASE ENDOSCOPIC;  Surgeon: Edie Norleen JINNY, MD;  Location: [facility] ORS;  Service: Orthopedics;  Laterality: Right;   CATARACT EXTRACTION Bilateral    Prolapsed bladder  03/2013   Dr. Alvia   REPLACEMENT TOTAL KNEE Left 01/2011   [facility]   REPLACEMENT TOTAL KNEE Right 03/2010   TOTAL ABDOMINAL HYSTERECTOMY W/ BILATERAL SALPINGOOPHORECTOMY  1974   BSO    Current Outpatient Medications  Medication Sig Dispense Refill   acetaminophen  (TYLENOL ) 500 MG tablet Take 1,000 mg by mouth every 6 (six) hours as needed for moderate pain.     amLODipine (NORVASC) 10 MG tablet TAKE 1 TABLET EVERY DAY 90 tablet 3   aspirin 81 MG chewable tablet Chew 81 mg by mouth daily.     calcium-vitamin D  (OSCAL WITH D) 500-200 MG-UNIT tablet Take 1 tablet by mouth daily.     Cholecalciferol (VITAMIN D3) 50 MCG (2000 UT) TABS Take 1 tablet by mouth daily.     clobetasol  ointment (TEMOVATE ) 0.05 % Apply to affected areas twice daily, in the morning and at bedtime. Use regularly with symptoms, can taper and decrease when symptoms resolve. 60 g 3   docusate sodium (COLACE) 100 MG capsule Take 100 mg by  mouth daily.     glipiZIDE  (GLUCOTROL  XL) 2.5 MG 24 hr tablet Take 1 tablet (2.5 mg total) by mouth daily with breakfast. 90 tablet 3   glucose blood (TRUE METRIX BLOOD GLUCOSE TEST) test strip TEST AS DIRECTED 300 strip 3   loratadine (CLARITIN) 10 MG tablet Take 1 tablet (10 mg total) by mouth daily. 90 tablet 3   lovastatin (MEVACOR) 40 MG tablet TAKE 1 TABLET EVERY DAY 90 tablet 3   meloxicam (MOBIC) 15 MG tablet Take 15 mg by mouth daily.     metFORMIN  (GLUCOPHAGE )  1000 MG tablet TAKE 1 TABLET TWICE DAILY 180 tablet 3   metoprolol  tartrate (LOPRESSOR ) 25 MG tablet Take 0.5 (12.5 mg) in the morning and 1 tablet (25 mg) in the evening     Omega-3 1000 MG CAPS Take 1,000 mg by mouth daily.     omeprazole (PRILOSEC) 20 MG capsule TAKE 1 CAPSULE EVERY DAY 90 capsule 3   spironolactone  (ALDACTONE ) 25 MG tablet Take 1 tablet (25 mg total) by mouth daily. 90 tablet 3   TRUEplus Lancets 33G MISC TEST BLOOD SUGAR EVERY DAY 100 each 3   valsartan -hydrochlorothiazide  (DIOVAN -HCT) 320-25 MG tablet TAKE 1 TABLET EVERY DAY 90 tablet 3   vitamin E 180 MG (400 UNITS) capsule Take 1 capsule by mouth daily.     No current facility-administered medications for this visit.     Allergies:   Alendronate , Betadine [povidone iodine], Celecoxib, and Povidone-iodine   Social History:  The patient  reports that she has never smoked. She has never used smokeless tobacco. She reports that she does not drink alcohol and does not use drugs.   Family History:   family history includes Aneurysm in her father; Diabetes in her brother, brother, sister, and sister; Hypertension in her brother, brother, sister, and sister; Kidney failure in her mother.    Review of Systems: Review of Systems  Constitutional: Negative.   HENT: Negative.    Respiratory: Negative.    Cardiovascular: Negative.   Gastrointestinal: Negative.   Musculoskeletal: Negative.   Neurological: Negative.   Psychiatric/Behavioral: Negative.    All other systems reviewed and are negative.    PHYSICAL EXAM: VS:  BP (!) 140/60 (BP Location: Left Arm, Patient Position: Sitting, Cuff Size: Normal)   Pulse 74   Ht 5' 2 (1.575 m)   Wt 161 lb 2 oz (73.1 kg)   SpO2 97%   BMI 29.47 kg/m  , BMI Body mass index is 29.47 kg/m. Constitutional:  oriented to person, place, and time. No distress.  HENT:  Head: Normocephalic and atraumatic.  Eyes:  no discharge. No scleral icterus.  Neck: Normal range of motion. Neck  supple. No JVD present.  Cardiovascular: Normal rate, regular rhythm, normal heart sounds and intact distal pulses. Exam reveals no gallop and no friction rub. No edema No murmur heard. Pulmonary/Chest: Effort normal and breath sounds normal. No stridor. No respiratory distress.  no wheezes.  no rales.  no tenderness.  Abdominal: Soft.  no distension.  no tenderness.  Musculoskeletal: Normal range of motion.  no  tenderness or deformity.  Neurological:  normal muscle tone. Coordination normal. No atrophy Skin: Skin is warm and dry. No rash noted. not diaphoretic.  Psychiatric:  normal mood and affect. behavior is normal. Thought content normal.     Recent Labs: 05/07/2023: TSH 1.860 09/24/2023: ALT 16; BNP 36.5; BUN 21; Creatinine, Ser 1.29; Hemoglobin 11.6; Magnesium 2.0; Platelets 423; Potassium 5.1; Sodium 132  Lipid Panel Lab Results  Component Value Date   CHOL 187 09/24/2023   HDL 57 09/24/2023   LDLCALC 72 09/24/2023   TRIG 373 (H) 09/24/2023      Wt Readings from Last 3 Encounters:  12/09/23 161 lb 2 oz (73.1 kg)  11/05/23 164 lb (74.4 kg)  10/27/23 164 lb 12.8 oz (74.8 kg)       ASSESSMENT AND PLAN:  Problem List Items Addressed This Visit       Cardiology Problems   Bilateral carotid artery stenosis   Relevant Medications   metoprolol  tartrate (LOPRESSOR ) 25 MG tablet   Hypertension   Relevant Medications   metoprolol  tartrate (LOPRESSOR ) 25 MG tablet   Other Relevant Orders   EKG 12-Lead (Completed)     Other   Type 2 diabetes mellitus with diabetic chronic kidney disease (HCC)   Other Visit Diagnoses       DOE (dyspnea on exertion)    -  Primary   Relevant Orders   EKG 12-Lead (Completed)     Hypercholesterolemia       Relevant Medications   metoprolol  tartrate (LOPRESSOR ) 25 MG tablet     Palpitations       Relevant Orders   EKG 12-Lead (Completed)       PAD, carotid stenosis Nonobstructive disease on ultrasound 2022 Minimal bruit on  the right Cholesterol reasonable on lovastatin Cholesterol ultrasound order previously placed, she preferred to hold off given cost, she might try to do this next year  Hyperlipidemia Continue lovastatin 40 mg daily Will continue to monitor numbers, may need to add Zetia  Essential hypertension She would like to add extra metoprolol  dosing at nighttime, recommend she increase up to 37.5 mg in the evening 12.5 up to 25 in the morning  Diabetes type 2 A1c trending down    Signed, Velinda Lunger, M.D., Ph.D. [facility] [place], [place] [phone]

## 2023-12-09 ENCOUNTER — Ambulatory Visit: Attending: Cardiovascular Disease | Admitting: Cardiovascular Disease

## 2023-12-09 VITALS — BP 140/60 | HR 74 | Ht 62.0 in | Wt 161.1 lb

## 2023-12-09 DIAGNOSIS — R0609 Other forms of dyspnea: Secondary | ICD-10-CM | POA: Diagnosis not present

## 2023-12-09 DIAGNOSIS — E1122 Type 2 diabetes mellitus with diabetic chronic kidney disease: Secondary | ICD-10-CM | POA: Diagnosis not present

## 2023-12-09 DIAGNOSIS — I10 Essential (primary) hypertension: Secondary | ICD-10-CM

## 2023-12-09 DIAGNOSIS — E7800 Pure hypercholesterolemia, unspecified: Secondary | ICD-10-CM

## 2023-12-09 DIAGNOSIS — R002 Palpitations: Secondary | ICD-10-CM

## 2023-12-09 DIAGNOSIS — N181 Chronic kidney disease, stage 1: Secondary | ICD-10-CM

## 2023-12-09 DIAGNOSIS — I6523 Occlusion and stenosis of bilateral carotid arteries: Secondary | ICD-10-CM | POA: Diagnosis not present

## 2023-12-09 MED ORDER — METOPROLOL TARTRATE 25 MG PO TABS: ORAL_TABLET | ORAL | 3 refills | Status: AC

## 2023-12-09 NOTE — Patient Instructions (Addendum)
 Medication Instructions:   Please increase the metoprolol  up to 1 1/2 pills in the evening  If you need a refill on your cardiac medications before your next appointment, please call your pharmacy.   Lab work: No new labs needed  Testing/Procedures: No new testing needed  Follow-Up: At [facility], you and your health needs are our priority.  As part of our continuing mission to provide you with exceptional heart care, we have created designated Provider Care Teams.  These Care Teams include your primary Cardiologist (physician) and Advanced Practice Providers (APPs -  Physician Assistants and Nurse Practitioners) who all work together to provide you with the care you need, when you need it.  You will need a follow up appointment in 12 months  Providers on your designated Care Team:   Lonni Meager, NP Bernardino Bring, PA-C Cadence [place], [place]  COVID-19 Vaccine Information can be found at: [email or web address] For questions related to vaccine distribution or appointments, please email [email or web address] or call [phone].

## 2023-12-15 ENCOUNTER — Emergency Department
Admission: EM | Admit: 2023-12-15 | Discharge: 2023-12-15 | Disposition: A | Discharge status: Home / Self Care | Attending: Emergency Medicine | Admitting: Emergency Medicine

## 2023-12-15 ENCOUNTER — Emergency Department

## 2023-12-15 ENCOUNTER — Other Ambulatory Visit: Payer: Self-pay

## 2023-12-15 DIAGNOSIS — W19XXXA Unspecified fall, initial encounter: Secondary | ICD-10-CM

## 2023-12-15 DIAGNOSIS — S199XXA Unspecified injury of neck, initial encounter: Secondary | ICD-10-CM | POA: Diagnosis not present

## 2023-12-15 DIAGNOSIS — I10 Essential (primary) hypertension: Secondary | ICD-10-CM | POA: Diagnosis not present

## 2023-12-15 DIAGNOSIS — W01198A Fall on same level from slipping, tripping and stumbling with subsequent striking against other object, initial encounter: Secondary | ICD-10-CM | POA: Diagnosis not present

## 2023-12-15 DIAGNOSIS — E119 Type 2 diabetes mellitus without complications: Secondary | ICD-10-CM | POA: Diagnosis not present

## 2023-12-15 DIAGNOSIS — M19042 Primary osteoarthritis, left hand: Secondary | ICD-10-CM | POA: Diagnosis not present

## 2023-12-15 DIAGNOSIS — S6992XA Unspecified injury of left wrist, hand and finger(s), initial encounter: Secondary | ICD-10-CM | POA: Diagnosis not present

## 2023-12-15 DIAGNOSIS — S0003XA Contusion of scalp, initial encounter: Secondary | ICD-10-CM | POA: Diagnosis not present

## 2023-12-15 DIAGNOSIS — S0181XA Laceration without foreign body of other part of head, initial encounter: Secondary | ICD-10-CM | POA: Insufficient documentation

## 2023-12-15 DIAGNOSIS — I6782 Cerebral ischemia: Secondary | ICD-10-CM | POA: Diagnosis not present

## 2023-12-15 MED ORDER — LIDOCAINE-EPINEPHRINE 2 %-1:100000 IJ SOLN
20.0000 mL | Freq: Once | INTRAMUSCULAR | Status: AC
  Administered 2023-12-15: 20 mL

## 2023-12-15 MED ORDER — ACETAMINOPHEN 325 MG PO TABS
650.0000 mg | ORAL_TABLET | Freq: Once | ORAL | Status: AC
  Administered 2023-12-15: 650 mg via ORAL
  Filled 2023-12-15: qty 2

## 2023-12-15 NOTE — Discharge Instructions (Addendum)
 The CT of your head and your neck did not show any acute abnormalities.  The x-ray of your hand did not show any fractures.  You can wear the wrist brace as needed for additional protection and support.  Laceration of the forehead was repaired today with stitches.  The sutures will need to come out in about 5 days.  This can be done by your primary care provider, urgent care or in the emergency department. Watch for signs of infection including redness, warmth, swelling, pain and pus drainage.  If you develop any of these please return to the ED, urgent care or your primary care provider.  You can take 650 mg of Tylenol  every 6 hours as needed for pain. You can use ice, heat, muscle creams and other topical pain relievers as well.

## 2023-12-15 NOTE — ED Triage Notes (Signed)
 First nurse note: Pt to ED via [facility] from home. Pt had ground level fall. Pt has 2in lac on right eye. No LOC. No blood thinners. Hx of Htn   175/79 HR 103 97% RA

## 2023-12-15 NOTE — ED Provider Notes (Signed)
 [facility] Provider Note    Event Date/Time   First MD Initiated Contact with Patient 12/15/23 1655     (approximate)   History   Fall   HPI  Natalie Bray is a 89 y.o. female with PMH of diabetes, hypertension presents for evaluation after a fall.  Patient tripped on a step when going her daughters house.  She fell forward and landed on her left side.  She did hit her head on the corner of a heater.  No LOC or blood thinners.  Patient endorses pain to her head, left shoulder, left arm and left hand.  Patient did not feel dizzy prior to the fall.      Physical Exam   Triage Vital Signs: ED Triage Vitals [12/15/23 1548]  Encounter Vitals Group     BP 126/70     Girls Systolic BP Percentile      Girls Diastolic BP Percentile      Boys Systolic BP Percentile      Boys Diastolic BP Percentile      Pulse Rate 88     Resp 18     Temp 98.1 F (36.7 C)     Temp Source Oral     SpO2 96 %     Weight 161 lb (73 kg)     Height 5' 2 (1.575 m)     Head Circumference      Peak Flow      Pain Score      Pain Loc      Pain Education      Exclude from Growth Chart     Most recent vital signs: Vitals:   12/15/23 1548 12/15/23 1910  BP: 126/70 132/68  Pulse: 88 89  Resp: 18 17  Temp: 98.1 F (36.7 C) 97.8 F (36.6 C)  SpO2: 96% 97%    General: Awake, no distress.  CV:  Good peripheral perfusion.  RRR. Resp:  Normal effort.  CTAB. Abd:  No distention.  Other:  Left hand is mildly tender to palpation but patient is able to perform intrinsic hand movements and range the wrist without difficulty, radial pulses 2+ and regular, grip strength is slightly decreased on that side when compared to the other side.  Approximately 2 cm laceration to the right forehead that is not actively bleeding at this time.  PERRL, EOM intact, symmetric facial movements, no ataxia and no pronator drift.   ED Results / Procedures / Treatments   Labs (all labs ordered  are listed, but only abnormal results are displayed) Labs Reviewed - No data to display   RADIOLOGY  CT head, CT cervical spine and left hand x-ray obtained, interpreted the images as well as reviewed the radiologist report.  X-ray of the hand is negative for fractures.  PROCEDURES:  Critical Care performed: No  .Laceration Repair  Date/Time: 12/15/2023 6:40 PM  Performed by: Cleaster Tinnie LABOR, PA-C Authorized by: Cleaster Tinnie LABOR, PA-C   Consent:    Consent obtained:  Verbal   Consent given by:  Patient   Risks, benefits, and alternatives were discussed: yes     Risks discussed:  Infection, pain and poor cosmetic result   Alternatives discussed:  No treatment Universal protocol:    Patient identity confirmed:  Verbally with patient Anesthesia:    Anesthesia method:  Local infiltration   Local anesthetic:  Lidocaine  2% WITH epi Laceration details:    Location:  Face   Face location:  Forehead  Length (cm):  3   Depth (mm):  10 Pre-procedure details:    Preparation:  Patient was prepped and draped in usual sterile fashion Exploration:    Limited defect created (wound extended): yes     Hemostasis achieved with:  Direct pressure   Wound exploration: entire depth of wound visualized   Treatment:    Area cleansed with:  Povidone-iodine   Amount of cleaning:  Standard   Irrigation solution:  Sterile saline   Irrigation method:  Syringe Skin repair:    Repair method:  Sutures   Suture size:  6-0   Suture material:  Prolene   Suture technique:  Simple interrupted   Number of sutures:  7 Approximation:    Approximation:  Close Repair type:    Repair type:  Simple Post-procedure details:    Dressing:  Adhesive bandage   Procedure completion:  Tolerated well, no immediate complications    MEDICATIONS ORDERED IN ED: Medications  lidocaine -EPINEPHrine  (XYLOCAINE  W/EPI) 2 %-1:100000 (with pres) injection 20 mL (20 mLs Infiltration Given 12/15/23 1759)   acetaminophen  (TYLENOL ) tablet 650 mg (650 mg Oral Given 12/15/23 1759)     IMPRESSION / MDM / ASSESSMENT AND PLAN / ED COURSE  I reviewed the triage vital signs and the nursing notes.                             89 year old female presents for evaluation after a fall.  Vital signs are stable patient NAD on exam.  Differential diagnosis includes, but is not limited to, laceration, abrasion, contusion, closed head injury, intracranial bleed, skull fracture, wrist fracture, cervical spine fracture, muscle strain.  Patient's presentation is most consistent with acute complicated illness / injury requiring diagnostic workup.  Patient given Tylenol  for pain.  CT head and cervical spine without acute abnormalities.    X-ray of the left hand is negative for fractures.  Offered wrist brace for support, which patient was interested in.  This may be soreness from the fall and may also be a wrist sprain.  Physical exam is reassuring.  No focal neurodeficits on exam.  Laceration repaired as described in procedure note above.  Patient instructed on wound care.  Patient is not sure if she is up to date on her tetanus. Declined tetanus today and will check with her PCP.  Patient voiced understanding, all questions were answered and she was stable at discharge.      FINAL CLINICAL IMPRESSION(S) / ED DIAGNOSES   Final diagnoses:  Fall, initial encounter  Laceration of forehead, initial encounter     Rx / DC Orders   ED Discharge Orders     None        Note:  This document was prepared using Dragon voice recognition software and may include unintentional dictation errors.   Cleaster Tinnie LABOR, PA-C 12/15/23 1943    Arlander Charleston, MD 12/15/23 2025

## 2023-12-15 NOTE — ED Triage Notes (Signed)
 Patient to ED via [facility] for fall; reports she tripped going inside of house and hit head the corner of a heater; no LOC, no blood thinners. Bleeding controlled at time of triage.

## 2023-12-16 DIAGNOSIS — S62015A Nondisplaced fracture of distal pole of navicular [scaphoid] bone of left wrist, initial encounter for closed fracture: Secondary | ICD-10-CM | POA: Diagnosis not present

## 2023-12-24 ENCOUNTER — Encounter: Payer: Self-pay | Admitting: Family Medicine

## 2023-12-24 ENCOUNTER — Ambulatory Visit: Admitting: Family Medicine

## 2023-12-24 VITALS — BP 134/48 | HR 67 | Resp 16

## 2023-12-24 DIAGNOSIS — M810 Age-related osteoporosis without current pathological fracture: Secondary | ICD-10-CM

## 2023-12-24 DIAGNOSIS — S0101XA Laceration without foreign body of scalp, initial encounter: Secondary | ICD-10-CM

## 2023-12-24 DIAGNOSIS — S63502A Unspecified sprain of left wrist, initial encounter: Secondary | ICD-10-CM

## 2023-12-24 NOTE — Progress Notes (Signed)
 Established patient visit   Patient: Natalie Bray   DOB: December 10, 1934   89 y.o. Female  MRN: [id number] Visit Date: 12/24/2023  Today's healthcare provider: Nancyann Perry, MD   Chief Complaint  Patient presents with   Follow-up    Patient was seen at ED on 12/15/23 for fall. She is here to have stitches removed.   Subjective    Discussed the use of AI scribe software for clinical note transcription with the patient, who gave verbal consent to proceed.  History of Present Illness   Natalie Bray is an 89 year old female who presents with a forehead laceration and left arm injury.  She experienced a fall on December 15, 2023, while entering her other daughter's house. She tripped over the door threshold and was unable to catch herself, resulting in a fall. During the fall, she hit her forehead above her right eye on a piece of furniture, causing a laceration that required seven stitches. She described the bruising under her eye as 'turning yellow.'  In addition to the forehead laceration, she sustained an injury to her left arm. She has a follow-up appointment with an orthopedist next Thursday. She is currently experiencing difficulty using her left hand due to the injury, as she is left-handed. This is her first fracture, and her previous bone density test over two years ago indicated strong bones. She has an upcoming appointment in March for a follow-up bone density test.       Medications: Outpatient Medications Prior to Visit  Medication Sig   acetaminophen  (TYLENOL ) 500 MG tablet Take 1,000 mg by mouth every 6 (six) hours as needed for moderate pain.   amLODipine (NORVASC) 10 MG tablet TAKE 1 TABLET EVERY DAY   aspirin 81 MG chewable tablet Chew 81 mg by mouth daily.   calcium-vitamin D  (OSCAL WITH D) 500-200 MG-UNIT tablet Take 1 tablet by mouth daily.   Cholecalciferol (VITAMIN D3) 50 MCG (2000 UT) TABS Take 1 tablet by mouth daily.   clobetasol  ointment (TEMOVATE )  0.05 % Apply to affected areas twice daily, in the morning and at bedtime. Use regularly with symptoms, can taper and decrease when symptoms resolve.   docusate sodium (COLACE) 100 MG capsule Take 100 mg by mouth daily.   glipiZIDE  (GLUCOTROL  XL) 2.5 MG 24 hr tablet Take 1 tablet (2.5 mg total) by mouth daily with breakfast.   glucose blood (TRUE METRIX BLOOD GLUCOSE TEST) test strip TEST AS DIRECTED   loratadine  (CLARITIN ) 10 MG tablet Take 1 tablet (10 mg total) by mouth daily.   lovastatin (MEVACOR) 40 MG tablet TAKE 1 TABLET EVERY DAY   meloxicam (MOBIC) 15 MG tablet Take 15 mg by mouth daily.   metFORMIN  (GLUCOPHAGE ) 1000 MG tablet TAKE 1 TABLET TWICE DAILY   metoprolol  tartrate (LOPRESSOR ) 25 MG tablet Take 0.5 tablet (12.5 mg) in the morning and 1.5 tablets (37.5 mg) in the evening   Omega-3 1000 MG CAPS Take 1,000 mg by mouth daily.   omeprazole (PRILOSEC) 20 MG capsule TAKE 1 CAPSULE EVERY DAY   spironolactone  (ALDACTONE ) 25 MG tablet Take 1 tablet (25 mg total) by mouth daily.   TRUEplus Lancets 33G MISC TEST BLOOD SUGAR EVERY DAY   valsartan -hydrochlorothiazide  (DIOVAN -HCT) 320-25 MG tablet TAKE 1 TABLET EVERY DAY   vitamin E 180 MG (400 UNITS) capsule Take 1 capsule by mouth daily.   No facility-administered medications prior to visit.        Objective  BP (!) 134/48 (BP Location: Right Arm, Patient Position: Sitting, Cuff Size: Normal)   Pulse 67   Resp 16   SpO2 100%   Physical Exam   General appearance: Well developed, well nourished female, cooperative and in no acute distress Head: Laceration above right eye brow clean dry, intact, no erythema. 7 sutures in place.  Respiratory: Respirations even and unlabored, normal respiratory rate Extremities: All extremities are intact. Left wrist brace in place.  Skin: Skin color, texture, turgor normal. No rashes seen  Psych: Appropriate mood and affect. Neurologic: Mental status: Alert, oriented to person, place, and  time, thought content appropriate.    Assessment & Plan    1. Laceration of scalp, initial encounter (Primary) Healing well without complications, no sign of infection. 7 sutures removed without complications. No bleeding. Instructed to keep clean and dry  2. Sprain of left wrist, unspecified location, initial encounter No fracture on x-rays done in ER. Continue wearing wrist brace until her orthopedic appointment next week.   3. Osteoporosis, unspecified osteoporosis type, unspecified pathological fracture presence Of right forearm. Intolerant to bisphosphonate but stable BMD on vitamin D  supplementation. Will arrange BMD at her next follow up apptin.       Nancyann Perry, MD  [facility] [phone] (phone) [phone] (fax)  [facility]

## 2024-01-01 DIAGNOSIS — S62015D Nondisplaced fracture of distal pole of navicular [scaphoid] bone of left wrist, subsequent encounter for fracture with routine healing: Secondary | ICD-10-CM | POA: Diagnosis not present

## 2024-01-05 ENCOUNTER — Other Ambulatory Visit: Payer: Self-pay | Admitting: Family Medicine

## 2024-01-07 ENCOUNTER — Telehealth: Payer: Self-pay | Admitting: Cardiovascular Disease

## 2024-01-07 NOTE — Telephone Encounter (Signed)
 Returned call to pt's daughter, Natalie Bray; identified the pt using 2 identifiers; Dtr wanted to know why we had ordered testing if pt didn't need to follow up with her cardiologist for a year, and she wanted to know the differences between the ultrasound that she had a few weeks ago and the ultrasound scheduled in January.  I explained that pt had echocardiogram which was an u/s of the heart; the test scheduled in January will be looking at the carotid arteries that supply blood to the brain; I reassured her if the test showed abnormal results that her cardiologist would follow up with her before a year.  All questions/concerns were addressed and pt's daughter verbalized understanding.

## 2024-01-07 NOTE — Telephone Encounter (Signed)
 Pt daughter called to ask why does pt need echo in January if she doesn't have to follow up until November 2026. Please advise.

## 2024-01-27 ENCOUNTER — Other Ambulatory Visit

## 2024-02-04 ENCOUNTER — Ambulatory Visit: Attending: Student

## 2024-02-04 DIAGNOSIS — I6523 Occlusion and stenosis of bilateral carotid arteries: Secondary | ICD-10-CM | POA: Diagnosis not present

## 2024-03-24 ENCOUNTER — Ambulatory Visit: Admitting: Family Medicine

## 2024-06-22 ENCOUNTER — Ambulatory Visit
